# Patient Record
Sex: Female | Born: 1985
Health system: Southern US, Community
[De-identification: ages and names within clinical notes are randomized; demographics above are authoritative.]

## PROBLEM LIST (undated history)

## (undated) DIAGNOSIS — G43909 Migraine, unspecified, not intractable, without status migrainosus: Secondary | ICD-10-CM

## (undated) DIAGNOSIS — M1990 Unspecified osteoarthritis, unspecified site: Secondary | ICD-10-CM

## (undated) DIAGNOSIS — M25569 Pain in unspecified knee: Secondary | ICD-10-CM

## (undated) HISTORY — PX: WISDOM TOOTH EXTRACTION: SHX21

## (undated) HISTORY — DX: Pain in unspecified knee: M25.569

## (undated) HISTORY — DX: Migraine, unspecified, not intractable, without status migrainosus: G43.909

## (undated) HISTORY — DX: Unspecified osteoarthritis, unspecified site: M19.90

---
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
## 2007-03-10 ENCOUNTER — Emergency Department (HOSPITAL_COMMUNITY): Admission: EM | Admit: 2007-03-10 | Discharge: 2007-03-11 | Payer: Self-pay | Admitting: Emergency Medicine

## 2007-03-13 ENCOUNTER — Emergency Department (HOSPITAL_COMMUNITY): Admission: EM | Admit: 2007-03-13 | Discharge: 2007-03-13 | Payer: Self-pay | Admitting: Emergency Medicine

## 2007-07-10 ENCOUNTER — Ambulatory Visit: Payer: Self-pay | Admitting: Obstetrics and Gynecology

## 2007-08-19 ENCOUNTER — Emergency Department (HOSPITAL_COMMUNITY): Admission: EM | Admit: 2007-08-19 | Discharge: 2007-08-19 | Payer: Self-pay | Admitting: Emergency Medicine

## 2008-07-04 ENCOUNTER — Emergency Department (HOSPITAL_COMMUNITY): Admission: EM | Admit: 2008-07-04 | Discharge: 2008-07-04 | Payer: Self-pay | Admitting: Emergency Medicine

## 2009-12-01 ENCOUNTER — Emergency Department (HOSPITAL_COMMUNITY): Admission: EM | Admit: 2009-12-01 | Discharge: 2009-12-01 | Payer: Self-pay | Admitting: Emergency Medicine

## 2011-03-10 NOTE — Consult Note (Signed)
NAME:  SAVON, COBBS NO.:  [id number]   MEDICAL RECORD NO.:  [id number]          PATIENT TYPE:  OIB   LOCATION:  A415                          FACILITY:  [facility]   PHYSICIAN:  Tilda Burrow, M.D. DATE OF BIRTH:  19-Sep-1986   DATE OF CONSULTATION:  11/10/2006  DATE OF DISCHARGE:                                 CONSULTATION   CHIEF COMPLAINT:  Lower abdominal discomfort, right lower abdomen.   HISTORY OF PRESENT ILLNESS:  This 25 year old primiparous female, [facility]  January 09, 2007, 31.4 weeks' gestation by verbal history, a patient at  [facility] Department scheduled for delivery at [facility],  presents to [facility] complaining of abdominal discomfort.  There has been no bleeding or gush of fluid.  The discomfort is vague  right-sided lower abdomen discomfort.  It has been uncomfortable to the  patient for about a day.  There is no precipitating event and the  patient presents to rule out labor.   External monitoring shows minimal uterine irritability.  Fetal heart  rate is in a reactive pattern.  Cervical exam is performed.  Cervix is  posterior, long, closed and high.  Presenting part is ballottable.  Ultrasound is not performed.   Laboratory assessment includes urinalysis, which is negative for  evidence of urinary tract infection.  Urine drug screen negative.   IMPRESSION:  Nonspecific discomforts of pregnancy.  I suspected round  ligament pain and no evidence of premature labor.   PLAN:  I reassured the patient.  Two Vicodin were given the patient.  Will discharge home for follow-up in [facility].      Tilda Burrow, M.D.  Electronically Signed     JVF/MEDQ  D:  11/10/2006  T:  11/10/2006  Job:  [id number]

## 2011-08-02 LAB — POCT PREGNANCY, URINE
Operator id: 198171
Preg Test, Ur: NEGATIVE

## 2011-08-03 LAB — POCT PREGNANCY, URINE
Operator id: 134861
Preg Test, Ur: NEGATIVE

## 2011-12-13 ENCOUNTER — Emergency Department (HOSPITAL_COMMUNITY)
Admission: EM | Admit: 2011-12-13 | Discharge: 2011-12-14 | Disposition: A | Discharge status: Home / Self Care | Payer: Self-pay | Attending: Emergency Medicine | Admitting: Emergency Medicine

## 2011-12-13 DIAGNOSIS — R10819 Abdominal tenderness, unspecified site: Secondary | ICD-10-CM | POA: Insufficient documentation

## 2011-12-13 DIAGNOSIS — N72 Inflammatory disease of cervix uteri: Secondary | ICD-10-CM | POA: Insufficient documentation

## 2011-12-13 DIAGNOSIS — R1031 Right lower quadrant pain: Secondary | ICD-10-CM | POA: Insufficient documentation

## 2011-12-13 DIAGNOSIS — N390 Urinary tract infection, site not specified: Secondary | ICD-10-CM | POA: Insufficient documentation

## 2011-12-14 ENCOUNTER — Encounter (HOSPITAL_COMMUNITY): Payer: Self-pay | Admitting: Emergency Medicine

## 2011-12-14 ENCOUNTER — Emergency Department (HOSPITAL_COMMUNITY): Payer: Self-pay

## 2011-12-14 LAB — COMPREHENSIVE METABOLIC PANEL
AST: 13 U/L (ref 0–37)
Alkaline Phosphatase: 46 U/L (ref 39–117)
CO2: 26 mEq/L (ref 19–32)
Calcium: 9.4 mg/dL (ref 8.4–10.5)
GFR calc non Af Amer: >90 mL/min (ref >90)
Total Protein: 7 g/dL (ref 6.0–8.3)

## 2011-12-14 LAB — DIFFERENTIAL
Basophils Absolute: 0 10*3/uL (ref 0.0–0.1)
Eosinophils Absolute: 0.1 10*3/uL (ref 0.0–0.7)
Eosinophils Relative: 1 % (ref 0–5)
Lymphocytes Relative: 31 % (ref 12–46)

## 2011-12-14 LAB — CBC
Hemoglobin: 13.2 g/dL (ref 12.0–15.0)
MCH: 30.1 pg (ref 26.0–34.0)
RBC: 4.38 MIL/uL (ref 3.87–5.11)
RDW: 12.5 % (ref 11.5–15.5)
WBC: 8.3 10*3/uL (ref 4.0–10.5)

## 2011-12-14 LAB — URINALYSIS, ROUTINE W REFLEX MICROSCOPIC
Bilirubin Urine: NEGATIVE
Glucose, UA: NEGATIVE mg/dL
Ketones, ur: NEGATIVE mg/dL
Specific Gravity, Urine: 1.032 — ABNORMAL HIGH (ref 1.005–1.030)
pH: 7 (ref 5.0–8.0)

## 2011-12-14 LAB — WET PREP, GENITAL: Yeast Wet Prep HPF POC: NONE SEEN

## 2011-12-14 MED ORDER — HYDROCODONE-ACETAMINOPHEN 5-500 MG PO TABS: 1.0000 | ORAL_TABLET | Freq: Four times a day (QID) | ORAL | Status: AC | PRN

## 2011-12-14 MED ORDER — DEXTROSE 5 % IV SOLN
1.0000 g | Freq: Once | INTRAVENOUS | Status: AC
  Administered 2011-12-14: 1 g via INTRAVENOUS
  Filled 2011-12-14: qty 10

## 2011-12-14 MED ORDER — SODIUM CHLORIDE 0.9 % IV BOLUS (SEPSIS)
1000.0000 mL | Freq: Once | INTRAVENOUS | Status: AC
  Administered 2011-12-14: 1000 mL via INTRAVENOUS

## 2011-12-14 MED ORDER — AZITHROMYCIN 250 MG PO TABS
1000.0000 mg | ORAL_TABLET | Freq: Once | ORAL | Status: AC
  Administered 2011-12-14: 1000 mg via ORAL
  Filled 2011-12-14: qty 4

## 2011-12-14 MED ORDER — NITROFURANTOIN MONOHYD MACRO 100 MG PO CAPS: 100.0000 mg | ORAL_CAPSULE | Freq: Two times a day (BID) | ORAL | Status: AC

## 2011-12-14 MED ORDER — ONDANSETRON HCL 4 MG/2ML IJ SOLN
4.0000 mg | Freq: Once | INTRAMUSCULAR | Status: AC
  Administered 2011-12-14: 4 mg via INTRAVENOUS
  Filled 2011-12-14: qty 2

## 2011-12-14 MED ORDER — IOHEXOL 300 MG/ML  SOLN
100.0000 mL | Freq: Once | INTRAMUSCULAR | Status: AC | PRN
  Administered 2011-12-14: 100 mL via INTRAVENOUS

## 2011-12-14 MED ORDER — MORPHINE SULFATE 4 MG/ML IJ SOLN
4.0000 mg | Freq: Once | INTRAMUSCULAR | Status: AC
  Administered 2011-12-14: 4 mg via INTRAVENOUS
  Filled 2011-12-14: qty 1

## 2011-12-14 NOTE — Discharge Instructions (Signed)

## 2011-12-14 NOTE — ED Provider Notes (Signed)
History     CSN: [id number]  Arrival date & time 12/13/11  2252   First MD Initiated Contact with Patient 12/14/11 0109      Chief Complaint  Patient presents with  . Abdominal Cramping    Rt abd    (Consider location/radiation/quality/duration/timing/severity/associated sxs/prior treatment) Patient is a 26 y.o. female presenting with abdominal pain. The history is provided by the patient. No language interpreter was used.  Abdominal Pain The primary symptoms of the illness include abdominal pain and vaginal discharge. The primary symptoms of the illness do not include fever, fatigue, shortness of breath, nausea, vomiting, diarrhea, dysuria or vaginal bleeding. The current episode started 6 to 12 hours ago. The onset of the illness was gradual. The problem has been gradually worsening.  The abdominal pain has been gradually worsening since its onset. The abdominal pain is located in the RLQ and suprapubic region. The abdominal pain does not radiate. The abdominal pain is relieved by nothing. The abdominal pain is exacerbated by movement and certain positions.  The vaginal discharge was first noticed 2 days ago. Vaginal discharge is a new problem. The patient believes that the vaginal discharge is unchanged since it began. The amount of discharge is moderate. The color of the discharge is clear. The discharge is described as homogeneous. The vaginal discharge is not associated with itching, burning or dysuria.  The patient states that she believes she is currently not pregnant. The patient has not had a change in bowel habit. Symptoms associated with the illness do not include chills, anorexia, diaphoresis, constipation, urgency or frequency.    History reviewed. No pertinent past medical history.  History reviewed. No pertinent past surgical history.  No family history on file.  History  Substance Use Topics  . Smoking status: Never Smoker   . Smokeless tobacco: Not on file  .  Alcohol Use: No    OB History    Grav Para Term Preterm Abortions TAB SAB Ect Mult Living                  Review of Systems  Constitutional: Negative for fever, chills, diaphoresis, activity change, appetite change and fatigue.  HENT: Negative for congestion, sore throat, rhinorrhea, neck pain and neck stiffness.   Respiratory: Negative for cough and shortness of breath.   Cardiovascular: Negative for chest pain and palpitations.  Gastrointestinal: Positive for abdominal pain. Negative for nausea, vomiting, diarrhea, constipation and anorexia.  Genitourinary: Positive for vaginal discharge. Negative for dysuria, urgency, frequency, flank pain and vaginal bleeding.  Skin: Negative for itching.  Neurological: Negative for dizziness, weakness, light-headedness, numbness and headaches.  All other systems reviewed and are negative.    Allergies  Penicillins  Home Medications   Current Outpatient Rx  Name Route Sig Dispense Refill  . HYDROCODONE-ACETAMINOPHEN 5-500 MG PO TABS Oral Take 1-2 tablets by mouth every 6 (six) hours as needed for pain. 12 tablet 0  . NITROFURANTOIN MONOHYD MACRO 100 MG PO CAPS Oral Take 1 capsule (100 mg total) by mouth 2 (two) times daily. 10 capsule 0    BP 112/58  Pulse 66  Temp(Src) 98.9 F (37.2 C) (Oral)  Resp 18  Ht 5' (1.524 m)  Wt 156 lb 3.2 oz (70.852 kg)  BMI 30.51 kg/m2  SpO2 100%  LMP 11/29/2011  Physical Exam  Nursing note and vitals reviewed. Constitutional: She is oriented to person, place, and time. She appears well-developed and well-nourished. No distress.  HENT:  Head: Normocephalic and atraumatic.  Mouth/Throat: Oropharynx is clear and moist.  Eyes: Conjunctivae and EOM are normal. Pupils are equal, round, and reactive to light.  Neck: Normal range of motion. Neck supple.  Cardiovascular: Normal rate, regular rhythm, normal heart sounds and intact distal pulses.  Exam reveals no gallop and no friction rub.   No murmur  heard. Pulmonary/Chest: Effort normal and breath sounds normal. No respiratory distress.  Abdominal: Soft. Bowel sounds are normal. There is tenderness (suprapubic and RLQ pain). There is no rebound and no guarding.  Genitourinary: Uterus normal. Vaginal discharge (purulence) found.       No adnexal pain on palpation  Musculoskeletal: Normal range of motion. She exhibits no tenderness.  Neurological: She is alert and oriented to person, place, and time. No cranial nerve deficit.  Skin: Skin is warm and dry. No rash noted.    ED Course  Procedures (including critical care time)  Labs Reviewed  URINALYSIS, ROUTINE W REFLEX MICROSCOPIC - Abnormal; Notable for the following:    Color, Urine AMBER (*) BIOCHEMICALS MAY BE AFFECTED BY COLOR   APPearance CLOUDY (*)    Specific Gravity, Urine 1.032 (*)    Leukocytes, UA SMALL (*)    All other components within normal limits  WET PREP, GENITAL - Abnormal; Notable for the following:    Clue Cells Wet Prep HPF POC FEW (*)    WBC, Wet Prep HPF POC FEW (*)    All other components within normal limits  URINE MICROSCOPIC-ADD ON - Abnormal; Notable for the following:    Squamous Epithelial / LPF FEW (*)    Bacteria, UA MANY (*)    All other components within normal limits  CBC  DIFFERENTIAL  COMPREHENSIVE METABOLIC PANEL  LIPASE, BLOOD  PREGNANCY, URINE  GC/CHLAMYDIA PROBE AMP, GENITAL   Ct Abdomen Pelvis W Contrast  12/14/2011  *RADIOLOGY REPORT*  Clinical Data: Right lower quadrant abdominal pain and cramping.  CT ABDOMEN AND PELVIS WITH CONTRAST  Technique:  Multidetector CT imaging of the abdomen and pelvis was performed following the standard protocol during bolus administration of intravenous contrast.  Contrast: OMNIPAQUE IOHEXOL 300 MG/ML IV SOLN  Comparison: None.  Findings: The visualized lung bases are clear.  There is minimal prominence of the intrahepatic biliary ducts.  The liver is otherwise unremarkable in appearance.  Given  the lack of distension of the common hepatic duct, this most likely reflects the patient's baseline.  The gallbladder is unremarkable in appearance.  The spleen is normal in appearance.  The pancreas and adrenal glands are grossly unremarkable.  The kidneys are unremarkable in appearance.  There is no evidence of hydronephrosis.  No renal or ureteral stones are seen.  No perinephric stranding is appreciated.  No free fluid is identified.  The small bowel is unremarkable in appearance.  The stomach is filled with contrast and is within normal limits.  No acute vascular abnormalities are seen.  The appendix is normal in caliber and contains air, without evidence for appendicitis.  The colon is partially filled with stool and is unremarkable in appearance.  A metallic piercing is noted at the umbilicus.  Postoperative change is noted along the anterior pelvic wall.  The bladder is mildly distended and grossly unremarkable in appearance.  The uterus is within normal limits; an intrauterine device is noted in expected position, at the fundus of the uterus. The ovaries appear relatively symmetric; no suspicious adnexal masses are seen.  No inguinal lymphadenopathy is seen.  No acute osseous abnormalities are identified.  IMPRESSION:  1.  No acute abnormalities seen within the abdomen or pelvis. 2.  Minimal prominence of the intrahepatic biliary ducts likely reflects the patient's baseline; the common hepatic duct remains normal in caliber, and the gallbladder is grossly unremarkable in appearance.  Original Report Authenticated By: Tonia Ghent, M.D.     1. UTI (lower urinary tract infection)   2. Cervicitis       MDM  Evidence of urinary tract infection and cervicitis on exam. There is no exam findings to suggest PID. CT abdomen pelvis was performed and showed right lower quadrant tenderness. This is unremarkable. She'll be discharged home after receiving a dose of IV Rocephin in the emergency department.  She'll be discharged home on Macrobid she has a penicillin allergy. She received Zithromax as well the emergency department to cover cervicitis. She'll be discharged home with some pain medication. Her to followup with her primary care physician        Dayton Bailiff, MD 12/14/11 [phone]

## 2011-12-14 NOTE — ED Notes (Signed)
Pt alert, nad, c/o right ovarian pain and cramping, describes as sharp, non radiating, denies bleeding, + discharge, denies bowel or bladder

## 2011-12-15 LAB — GC/CHLAMYDIA PROBE AMP, GENITAL: Chlamydia, DNA Probe: NEGATIVE

## 2012-08-26 ENCOUNTER — Other Ambulatory Visit (HOSPITAL_COMMUNITY)
Admission: RE | Admit: 2012-08-26 | Discharge: 2012-08-26 | Disposition: A | Discharge status: Home / Self Care | Payer: 59 | Source: Ambulatory Visit | Attending: Family Medicine | Admitting: Family Medicine

## 2012-08-26 ENCOUNTER — Other Ambulatory Visit: Payer: Self-pay | Admitting: Family Medicine

## 2012-08-26 DIAGNOSIS — Z124 Encounter for screening for malignant neoplasm of cervix: Secondary | ICD-10-CM | POA: Insufficient documentation

## 2012-08-26 DIAGNOSIS — Z113 Encounter for screening for infections with a predominantly sexual mode of transmission: Secondary | ICD-10-CM | POA: Insufficient documentation

## 2014-07-05 ENCOUNTER — Ambulatory Visit (INDEPENDENT_AMBULATORY_CARE_PROVIDER_SITE_OTHER): Payer: BC Managed Care – PPO | Admitting: Emergency Medicine

## 2014-07-05 VITALS — BP 108/58 | HR 75 | Temp 98.8°F | Resp 16 | Ht 62.0 in | Wt 160.0 lb

## 2014-07-05 DIAGNOSIS — B9689 Other specified bacterial agents as the cause of diseases classified elsewhere: Secondary | ICD-10-CM

## 2014-07-05 DIAGNOSIS — N898 Other specified noninflammatory disorders of vagina: Secondary | ICD-10-CM

## 2014-07-05 DIAGNOSIS — R300 Dysuria: Secondary | ICD-10-CM

## 2014-07-05 DIAGNOSIS — N760 Acute vaginitis: Secondary | ICD-10-CM

## 2014-07-05 DIAGNOSIS — A499 Bacterial infection, unspecified: Secondary | ICD-10-CM

## 2014-07-05 LAB — POCT URINALYSIS DIPSTICK
BILIRUBIN UA: NEGATIVE
Glucose, UA: NEGATIVE
KETONES UA: NEGATIVE
Nitrite, UA: POSITIVE
PH UA: 7
SPEC GRAV UA: 1.025
UROBILINOGEN UA: 0.2

## 2014-07-05 LAB — POCT WET PREP WITH KOH
Clue Cells Wet Prep HPF POC: 75
KOH PREP POC: NEGATIVE
RBC Wet Prep HPF POC: NEGATIVE
TRICHOMONAS UA: NEGATIVE
WBC WET PREP PER HPF POC: NEGATIVE
Yeast Wet Prep HPF POC: NEGATIVE

## 2014-07-05 LAB — POCT UA - MICROSCOPIC ONLY
CASTS, UR, LPF, POC: NEGATIVE
CRYSTALS, UR, HPF, POC: NEGATIVE
Mucus, UA: NEGATIVE
YEAST UA: NEGATIVE

## 2014-07-05 LAB — POCT URINE PREGNANCY: PREG TEST UR: NEGATIVE

## 2014-07-05 MED ORDER — PHENAZOPYRIDINE HCL 200 MG PO TABS: 200.0000 mg | ORAL_TABLET | Freq: Three times a day (TID) | ORAL | Status: DC | PRN

## 2014-07-05 MED ORDER — METRONIDAZOLE 500 MG PO TABS: 500.0000 mg | ORAL_TABLET | Freq: Two times a day (BID) | ORAL | Status: DC

## 2014-07-05 MED ORDER — CIPROFLOXACIN HCL 500 MG PO TABS: 500.0000 mg | ORAL_TABLET | Freq: Two times a day (BID) | ORAL | Status: DC

## 2014-07-05 NOTE — Progress Notes (Signed)
[facility] [address] [place] [address] [phone]  Date:  07/05/2014   Name:  Judy James   DOB:  01-13-1986   MRN:  [id number]  PCP:  No primary provider on file.    Chief Complaint: Dysuria   History of Present Illness:  Judy James is a 28 y.o. very pleasant female patient who presents with the following:  Patient has an IUD in place.  Last menses normal. Has dysuria, urgency and frequent urination. Has foul smell with urine and some bloody mucous. No dyspareunia No improvement with over the counter medications or other home remedies.  Denies other complaint or health concern today.   There are no active problems to display for this patient.   No past medical history on file.  Past Surgical History  Procedure Laterality Date  . Cesarean section      History  Substance Use Topics  . Smoking status: Never Smoker   . Smokeless tobacco: Not on file  . Alcohol Use: No     Comment: 0-1    Family History  Problem Relation Age of Onset  . Diabetes Mother     Allergies  Allergen Reactions  . Penicillins     Medication list has been reviewed and updated.  No current outpatient prescriptions on file prior to visit.   No current facility-administered medications on file prior to visit.    Review of Systems:  As per HPI, otherwise negative.    Physical Examination: Filed Vitals:   07/05/14 0853  BP: 108/58  Pulse: 75  Temp: 98.8 F (37.1 C)  Resp: 16   Filed Vitals:   07/05/14 0853  Height: 5\' 2"  (1.575 m)  Weight: 160 lb (72.576 kg)   Body mass index is 29.26 kg/(m^2). Ideal Body Weight: Weight in (lb) to have BMI = 25: 136.4   GEN: WDWN, NAD, Non-toxic, Alert & Oriented x 3 HEENT: Atraumatic, Normocephalic.  Ears and Nose: No external deformity. EXTR: No clubbing/cyanosis/edema NEURO: Normal gait.  PSYCH: Normally interactive. Conversant. Not depressed or anxious appearing.  Calm demeanor.  ABD: soft  and not tender.   Assessment and Plan: Cystitis BV cipro  Pyridium Flagyl  Signed,  Ellison Carwin, MD   Results for orders placed in visit on 07/05/14  POCT UA - MICROSCOPIC ONLY      Result Value Ref Range   WBC, Ur, HPF, POC 25-30     RBC, urine, microscopic 1-3     Bacteria, U Microscopic 1+     Mucus, UA neg     Epithelial cells, urine per micros 0-1     Crystals, Ur, HPF, POC neg     Casts, Ur, LPF, POC neg     Yeast, UA neg    POCT URINALYSIS DIPSTICK      Result Value Ref Range   Color, UA yellow     Clarity, UA cloudy     Glucose, UA neg     Bilirubin, UA neg     Ketones, UA neg     Spec Grav, UA 1.025     Blood, UA moderate     pH, UA 7.0     Protein, UA >=300     Urobilinogen, UA 0.2     Nitrite, UA positive     Leukocytes, UA large (3+)    POCT URINE PREGNANCY      Result Value Ref Range   Preg Test, Ur Negative    POCT  WET PREP WITH KOH      Result Value Ref Range   Trichomonas, UA Negative     Clue Cells Wet Prep HPF POC 75%     Epithelial Wet Prep HPF POC 5-10     Yeast Wet Prep HPF POC neg     Bacteria Wet Prep HPF POC trace     RBC Wet Prep HPF POC neg     WBC Wet Prep HPF POC neg     KOH Prep POC Negative

## 2014-07-05 NOTE — Patient Instructions (Signed)
Bacterial Vaginosis Bacterial vaginosis is a vaginal infection that occurs when the normal balance of bacteria in the vagina is disrupted. It results from an overgrowth of certain bacteria. This is the most common vaginal infection in women of childbearing age. Treatment is important to prevent complications, especially in pregnant women, as it can cause a premature delivery. CAUSES  Bacterial vaginosis is caused by an increase in harmful bacteria that are normally present in smaller amounts in the vagina. Several different kinds of bacteria can cause bacterial vaginosis. However, the reason that the condition develops is not fully understood. RISK FACTORS Certain activities or behaviors can put you at an increased risk of developing bacterial vaginosis, including:  Having a new sex partner or multiple sex partners.  Douching.  Using an intrauterine device (IUD) for contraception. Women do not get bacterial vaginosis from toilet seats, bedding, swimming pools, or contact with objects around them. SIGNS AND SYMPTOMS  Some women with bacterial vaginosis have no signs or symptoms. Common symptoms include:  Grey vaginal discharge.  A fishlike odor with discharge, especially after sexual intercourse.  Itching or burning of the vagina and vulva.  Burning or pain with urination. DIAGNOSIS  Your health care provider will take a medical history and examine the vagina for signs of bacterial vaginosis. A sample of vaginal fluid may be taken. Your health care provider will look at this sample under a microscope to check for bacteria and abnormal cells. A vaginal pH test may also be done.  TREATMENT  Bacterial vaginosis may be treated with antibiotic medicines. These may be given in the form of a pill or a vaginal cream. A second round of antibiotics may be prescribed if the condition comes back after treatment.  HOME CARE INSTRUCTIONS   Only take over-the-counter or prescription medicines as  directed by your health care provider.  If antibiotic medicine was prescribed, take it as directed. Make sure you finish it even if you start to feel better.  Do not have sex until treatment is completed.  Tell all sexual partners that you have a vaginal infection. They should see their health care provider and be treated if they have problems, such as a mild rash or itching.  Practice safe sex by using condoms and only having one sex partner. SEEK MEDICAL CARE IF:   Your symptoms are not improving after 3 days of treatment.  You have increased discharge or pain.  You have a fever. MAKE SURE YOU:   Understand these instructions.  Will watch your condition.  Will get help right away if you are not doing well or get worse. FOR MORE INFORMATION  Centers for Disease Control and Prevention, Division of STD Prevention: www.cdc.gov/std American Sexual Health Association (ASHA): www.ashastd.org  Document Released: 10/09/2005 Document Revised: 07/30/2013 Document Reviewed: 05/21/2013 ExitCare Patient Information 2015 [employer]. This information is not intended to replace advice given to you by your health care provider. Make sure you discuss any questions you have with your health care provider.  

## 2014-07-07 LAB — GC/CHLAMYDIA PROBE AMP
CT PROBE, AMP APTIMA: NEGATIVE
GC PROBE AMP APTIMA: NEGATIVE

## 2014-08-11 ENCOUNTER — Ambulatory Visit (INDEPENDENT_AMBULATORY_CARE_PROVIDER_SITE_OTHER): Payer: BC Managed Care – PPO | Admitting: Family Medicine

## 2014-08-11 ENCOUNTER — Ambulatory Visit (INDEPENDENT_AMBULATORY_CARE_PROVIDER_SITE_OTHER)
Admission: RE | Admit: 2014-08-11 | Discharge: 2014-08-11 | Disposition: A | Discharge status: Home / Self Care | Payer: BC Managed Care – PPO | Source: Ambulatory Visit | Attending: Family Medicine | Admitting: Family Medicine

## 2014-08-11 ENCOUNTER — Encounter: Payer: Self-pay | Admitting: Family Medicine

## 2014-08-11 VITALS — BP 112/68 | HR 75 | Temp 98.3°F | Ht 61.25 in | Wt 163.5 lb

## 2014-08-11 DIAGNOSIS — G43909 Migraine, unspecified, not intractable, without status migrainosus: Secondary | ICD-10-CM | POA: Insufficient documentation

## 2014-08-11 DIAGNOSIS — G43919 Migraine, unspecified, intractable, without status migrainosus: Secondary | ICD-10-CM

## 2014-08-11 DIAGNOSIS — M25562 Pain in left knee: Secondary | ICD-10-CM

## 2014-08-11 MED ORDER — SUMATRIPTAN SUCCINATE 25 MG PO TABS: 25.0000 mg | ORAL_TABLET | ORAL | Status: DC | PRN

## 2014-08-11 NOTE — Assessment & Plan Note (Signed)
Progressive. Xray today for initial work up. The patient indicates understanding of these issues and agrees with the plan.

## 2014-08-11 NOTE — Progress Notes (Signed)
Pre visit review using our clinic review tool, if applicable. No additional management support is needed unless otherwise documented below in the visit note. 

## 2014-08-11 NOTE — Patient Instructions (Addendum)
Great to meet you.  We are starting Imitrex 25 mg as needed for migraines. Please keep a headache journal.  Call in a month or so with an update.  Perhaps you should try taking a zyrtec every morning.  I will call you with your xray results.

## 2014-08-11 NOTE — Assessment & Plan Note (Signed)
Progressing.  She would likely benefit from prophylaxis. Advised to cut back on excedrin migraine- can cause rebound headaches and can damage her kidneys.  Given rx for imitrex for abortive therapy- discussed not overusing this. Keep a headache journal. Follow up in 3-4 weeks- will plan on adding another prophylactic rx like elavil if needed at that time. The patient indicates understanding of these issues and agrees with the plan.

## 2014-08-11 NOTE — Progress Notes (Signed)
Subjective:    Patient ID: Judy James, female    DOB: 1986/07/25, 28 y.o.   MRN: [id number]  HPI  Very pleasant 28 yo female here to establish care.  Migraine headaches- developed shortly after the birth of her daughter in 2008.  Getting progressively worse.  Had an MRI in 2010 per pt that was neg.  Was placed on topamax for prophylaxis--did not take it for long because it made her too groggy.  Was never given abortive therapy per pt.  She takes excedrin migraine when she gets a headache.  Has more than 3 migraines per month- associated with phonophobia, photophobia, and intermittent nausea and vomiting. She is unsure of the triggers- kept a headache journal.  Did not notice any triggers like food, stress, fatigue. Periods are light- on OCPs.  Starting OCPs did not improve her migraines. She did notice they worsened when she started her current job.  Also notices she sneezes as soon as she gets to work.  Left knee pain- chronic and progressing issue. Diagnosed with Osgood-Schlatter in 2nd grade.  Since then, she has had knee pain and swelling especially when the weather changes.  Her knee has given out on her.  Has tried wearing a knee brace.  No imaging doe since she was a child.  No current outpatient prescriptions on file prior to visit.   No current facility-administered medications on file prior to visit.    Allergies  Allergen Reactions  . Penicillins     Past Medical History  Diagnosis Date  . Migraines   . Knee pain   . Arthritis     knee    Past Surgical History  Procedure Laterality Date  . Cesarean section    . Cesarean section  2008  . Wisdom tooth extraction      Family History  Problem Relation Age of Onset  . Diabetes Mother   . Diabetes Maternal Aunt   . Hearing loss Maternal Aunt   . Heart disease Maternal Aunt   . Hypertension Maternal Aunt   . Stroke Maternal Aunt   . Alcohol abuse Maternal Uncle   . Diabetes Maternal Uncle   . Drug  abuse Maternal Uncle   . Hyperlipidemia Maternal Uncle   . Hypertension Maternal Uncle   . Heart disease Maternal Uncle   . Cancer Maternal Grandmother   . COPD Maternal Grandfather     History   Social History  . Marital Status: Single    Spouse Name: N/A    Number of Children: N/A  . Years of Education: N/A   Occupational History  . Not on file.   Social History Main Topics  . Smoking status: Never Smoker   . Smokeless tobacco: Never Used  . Alcohol Use: Yes     Comment: 0-1  . Drug Use: No  . Sexual Activity: Yes   Other Topics Concern  . Not on file   Social History Narrative  . No narrative on file   The PMH, PSH, Social History, Family History, Medications, and allergies have been reviewed in [facility], and have been updated if relevant.  Review of Systems  Constitutional: Negative for fatigue.  HENT: Positive for sneezing. Negative for congestion, rhinorrhea, sore throat and trouble swallowing.   Eyes: Positive for photophobia. Negative for visual disturbance.  Respiratory: Negative.   Gastrointestinal: Positive for nausea and vomiting.  Musculoskeletal: Positive for joint swelling. Negative for gait problem.  Neurological: Positive for headaches. Negative for dizziness, tremors, seizures,  syncope, speech difficulty, weakness and numbness.  Psychiatric/Behavioral: Negative.   All other systems reviewed and are negative.      Objective:   Physical Exam  Constitutional: She is oriented to person, place, and time. She appears well-developed and well-nourished. No distress.  HENT:  Head: Normocephalic and atraumatic.  Eyes: Pupils are equal, round, and reactive to light.  Neck: Normal range of motion. Neck supple.  Musculoskeletal:       Left knee: She exhibits swelling, deformity and bony tenderness. She exhibits no effusion, normal alignment and no LCL laxity. No medial joint line, no lateral joint line, no MCL, no LCL and no patellar tendon tenderness noted.    Neurological: She is alert and oriented to person, place, and time. No cranial nerve deficit. Coordination normal.  Skin: Skin is warm and dry.  Psychiatric: She has a normal mood and affect. Her behavior is normal. Thought content normal.   BP 112/68  Pulse 75  Temp(Src) 98.3 F (36.8 C) (Oral)  Ht 5' 1.25" (1.556 m)  Wt 163 lb 8 oz (74.163 kg)  BMI 30.63 kg/m2  SpO2 98%  LMP 07/22/2014        Assessment & Plan:

## 2014-09-02 ENCOUNTER — Ambulatory Visit (INDEPENDENT_AMBULATORY_CARE_PROVIDER_SITE_OTHER): Payer: BC Managed Care – PPO | Admitting: Family Medicine

## 2014-09-02 ENCOUNTER — Encounter: Payer: Self-pay | Admitting: Family Medicine

## 2014-09-02 VITALS — BP 118/64 | HR 68 | Temp 99.6°F | Ht 61.25 in | Wt 161.8 lb

## 2014-09-02 DIAGNOSIS — M9242 Juvenile osteochondrosis of patella, left knee: Secondary | ICD-10-CM

## 2014-09-02 DIAGNOSIS — M9252 Juvenile osteochondrosis of tibia and fibula, left leg: Principal | ICD-10-CM

## 2014-09-02 DIAGNOSIS — M92522 Juvenile osteochondrosis of tibia tubercle, left leg: Secondary | ICD-10-CM

## 2014-09-02 DIAGNOSIS — M25562 Pain in left knee: Secondary | ICD-10-CM

## 2014-09-02 NOTE — Progress Notes (Signed)
Dr. Frederico Hamman T. Sacha Topor, MD, [facility] [place], [address] Phone: [phone] Fax: [phone]  09/02/2014  Patient: Judy James, MRN: [id number], DOB: 1986/01/18, 28 y.o.  Primary Physician:  Arnette Norris, MD  Chief Complaint: Knee Pain  Subjective:   Judy James is a 28 y.o. very pleasant female patient who presents with the following:  Consulting MD: Dr. Deborra Medina Reason: Left knee pain  Pleasant young lady who has had knee pain since the third grade, between 15 and 20 years on her left tibial tubercle. As a child she was told that she had Freight forwarder, and then her symptoms will improve as she has gotten older. She was very active throughout her childhood, and she was a Therapist, sports in, and she also did track. She has been limited since that time and was unable to complete sometimes in both track and cheerleading secondary to knee pain all in this one location. She has not had any particular injury, or trauma or internal derangement.  She does use multiple braces, and she has taken multiple types of a inflammatories. She has done various types of rehabilitation programs and strengthening programs that different physicians have given to her over this long time.  Consult Ortho Surgery, Elsie Stain  Past Medical History, Surgical History, Social History, Family History, Problem List, Medications, and Allergies have been reviewed and updated if relevant.  GEN: No fevers, chills. Nontoxic. Primarily MSK c/o today. MSK: Detailed in the HPI GI: tolerating PO intake without difficulty Neuro: No numbness, parasthesias, or tingling associated. Otherwise the pertinent positives of the ROS are noted above.   Objective:   BP 118/64 mmHg  Pulse 68  Temp(Src) 99.6 F (37.6 C) (Oral)  Ht 5' 1.25" (1.556 m)  Wt 161 lb 12 oz (73.369 kg)  BMI 30.30 kg/m2  SpO2 96%  LMP 07/22/2014   GEN: WDWN, NAD, Non-toxic,  Alert & Oriented x 3 HEENT: Atraumatic, Normocephalic.  Ears and Nose: No external deformity. EXTR: No clubbing/cyanosis/edema NEURO: Normal gait.  PSYCH: Normally interactive. Conversant. Not depressed or anxious appearing.  Calm demeanor.   Knee:  L Gait: Normal heel toe pattern ROM: 0-130 Effusion: neg Echymosis or edema: none Patellar tendon NT DIRECTLY TENDER AT THE TIBIAL TUBERCLE AT PROMINENT BUMP Painful PLICA: neg Patellar grind: negative Medial and lateral patellar facet loading: negative medial and lateral joint lines:NT Mcmurray's neg Flexion-pinch neg Varus and valgus stress: stable Lachman: neg Ant and Post drawer: neg Hip abduction, IR, ER: WNL Hip flexion str: 5/5 Hip abd: 5/5 Quad: 5/5 VMO atrophy:No Hamstring concentric and eccentric: 5/5   Radiology: Dg Knee Complete 4 Views Left  08/11/2014   CLINICAL DATA:  Progressive left knee pain  EXAM: LEFT KNEE - COMPLETE 4+ VIEW  COMPARISON:  None.  FINDINGS: There is no evidence of fracture, dislocation, or joint effusion. There is a well corticated ossific fragment adjacent to the tibial tuberosity likely reflecting sequela of prior Osgood-Schlatter disease. There is no evidence of arthropathy or other focal bone abnormality. Soft tissues are unremarkable.  IMPRESSION: No acute osseous injury of the left knee.   Electronically Signed   By: Kathreen Devoid   On: 08/11/2014 13:02    Assessment and Plan:   Osgood-Schlatter's disease of left knee  Left knee pain  Pain and tibial tubercle at old [facility] Schlatters at former ossification center. Concerning in a 28 year old with bone pain in this region. Failure of conservative management  for approaching 20 years.  I discussed this with the patient, and at this point I think that it is reasonable for her to discuss this case with the knee surgeon to consider definitive surgical management. I am going to discuss this case with some of my colleagues for  recommendations.  I appreciate the opportunity to evaluate this very friendly patient. If you have any question regarding her care or prognosis, do not hesitate to ask.   Signed,  Maud Deed. Syrina Wake, MD   Patient's Medications  New Prescriptions   No medications on file  Previous Medications   SUMATRIPTAN (IMITREX) 25 MG TABLET    Take 1 tablet (25 mg total) by mouth every 2 (two) hours as needed for migraine or headache. May repeat in 2 hours if headache persists or recurs.  Modified Medications   No medications on file  Discontinued Medications   No medications on file

## 2014-09-02 NOTE — Progress Notes (Signed)
Pre visit review using our clinic review tool, if applicable. No additional management support is needed unless otherwise documented below in the visit note. 

## 2014-09-08 ENCOUNTER — Other Ambulatory Visit: Payer: Self-pay | Admitting: Family Medicine

## 2014-09-08 DIAGNOSIS — M25562 Pain in left knee: Secondary | ICD-10-CM

## 2014-09-08 NOTE — Progress Notes (Signed)
Judy James notified as instructed by telephone.  She would like to move forward with MRI. Advised Judy James would be in contact with her once she gets the MRI scheduled.

## 2014-09-08 NOTE — Progress Notes (Signed)
Would you mind calling the patient. I talked about her with one of my colleagues over the weekend, and we thought we should also get an MRI of her knee first to get a better look at the bone where she hurts and at all the other structures of her knee, too. We'll look at that and then go from there.   Electronically Signed  By: Owens Loffler, MD On: 09/08/2014 10:45 AM

## 2014-09-13 ENCOUNTER — Inpatient Hospital Stay: Admission: RE | Admit: 2014-09-13 | Payer: BC Managed Care – PPO | Source: Ambulatory Visit

## 2014-09-15 ENCOUNTER — Ambulatory Visit
Admission: RE | Admit: 2014-09-15 | Discharge: 2014-09-15 | Disposition: A | Discharge status: Home / Self Care | Payer: BC Managed Care – PPO | Source: Ambulatory Visit | Attending: Family Medicine | Admitting: Family Medicine

## 2014-09-15 DIAGNOSIS — M25562 Pain in left knee: Secondary | ICD-10-CM

## 2014-09-18 ENCOUNTER — Other Ambulatory Visit: Payer: Self-pay | Admitting: Family Medicine

## 2014-09-18 DIAGNOSIS — M7652 Patellar tendinitis, left knee: Secondary | ICD-10-CM

## 2014-09-18 DIAGNOSIS — M25562 Pain in left knee: Secondary | ICD-10-CM

## 2014-12-22 ENCOUNTER — Ambulatory Visit: Payer: 59 | Admitting: Family Medicine

## 2015-07-15 ENCOUNTER — Other Ambulatory Visit (HOSPITAL_COMMUNITY)
Admission: RE | Admit: 2015-07-15 | Discharge: 2015-07-15 | Disposition: A | Discharge status: Home / Self Care | Payer: 59 | Source: Ambulatory Visit | Attending: Family Medicine | Admitting: Family Medicine

## 2015-07-15 ENCOUNTER — Ambulatory Visit (INDEPENDENT_AMBULATORY_CARE_PROVIDER_SITE_OTHER): Payer: 59 | Admitting: Family Medicine

## 2015-07-15 ENCOUNTER — Encounter: Payer: Self-pay | Admitting: *Deleted

## 2015-07-15 ENCOUNTER — Encounter: Payer: Self-pay | Admitting: Family Medicine

## 2015-07-15 VITALS — BP 114/66 | HR 70 | Temp 97.9°F | Ht 61.0 in | Wt 168.5 lb

## 2015-07-15 DIAGNOSIS — Z1151 Encounter for screening for human papillomavirus (HPV): Secondary | ICD-10-CM | POA: Insufficient documentation

## 2015-07-15 DIAGNOSIS — N760 Acute vaginitis: Secondary | ICD-10-CM | POA: Insufficient documentation

## 2015-07-15 DIAGNOSIS — Z113 Encounter for screening for infections with a predominantly sexual mode of transmission: Secondary | ICD-10-CM | POA: Diagnosis present

## 2015-07-15 DIAGNOSIS — Z01419 Encounter for gynecological examination (general) (routine) without abnormal findings: Secondary | ICD-10-CM

## 2015-07-15 DIAGNOSIS — Z01411 Encounter for gynecological examination (general) (routine) with abnormal findings: Secondary | ICD-10-CM

## 2015-07-15 DIAGNOSIS — Z202 Contact with and (suspected) exposure to infections with a predominantly sexual mode of transmission: Secondary | ICD-10-CM | POA: Diagnosis not present

## 2015-07-15 MED ORDER — METRONIDAZOLE 500 MG PO TABS: 500.0000 mg | ORAL_TABLET | Freq: Two times a day (BID) | ORAL | Status: DC

## 2015-07-15 NOTE — Progress Notes (Signed)
Pre visit review using our clinic review tool, if applicable. No additional management support is needed unless otherwise documented below in the visit note. 

## 2015-07-15 NOTE — Patient Instructions (Signed)
Please return 2 weeks after finishing flagyl to retest. Great to see you.

## 2015-07-15 NOTE — Progress Notes (Signed)
Subjective:   Patient ID: Judy James, female    DOB: 01/11/1986, 29 y.o.   MRN: [id number]  Judy James is a pleasant 29 y.o. year old female who presents to clinic today with Annual Exam  on 07/15/2015  HPI:  Trichomonas- was diagnosed through the health department but not yet treated for trichomonas.  She does not have these lab results with her.   Her boyfriend is going to get tested today.  She has had more vaginal discharge recently.  No pelvic pain, no fevers.  No nausea or vomiting.  Per pt, was tested for HIV and syphilis and was told both negative.  Has IUD.  No current outpatient prescriptions on file prior to visit.   No current facility-administered medications on file prior to visit.    Allergies  Allergen Reactions  . Penicillins     Past Medical History  Diagnosis Date  . Migraines   . Knee pain   . Arthritis     knee    Past Surgical History  Procedure Laterality Date  . Cesarean section    . Cesarean section  2008  . Wisdom tooth extraction      Family History  Problem Relation Age of Onset  . Diabetes Mother   . Diabetes Maternal Aunt   . Hearing loss Maternal Aunt   . Heart disease Maternal Aunt   . Hypertension Maternal Aunt   . Stroke Maternal Aunt   . Alcohol abuse Maternal Uncle   . Diabetes Maternal Uncle   . Drug abuse Maternal Uncle   . Hyperlipidemia Maternal Uncle   . Hypertension Maternal Uncle   . Heart disease Maternal Uncle   . Cancer Maternal Grandmother   . COPD Maternal Grandfather     Social History   Social History  . Marital Status: Single    Spouse Name: N/A  . Number of Children: N/A  . Years of Education: N/A   Occupational History  . Not on file.   Social History Main Topics  . Smoking status: Never Smoker   . Smokeless tobacco: Never Used  . Alcohol Use: 0.0 oz/week    0 Standard drinks or equivalent per week     Comment: 0-1  . Drug Use: No  . Sexual Activity: Yes   Other Topics Concern   . Not on file   Social History Narrative   .[email or web address]    Review of Systems  Constitutional: Negative.   HENT: Negative.   Eyes: Negative.   Respiratory: Negative.   Cardiovascular: Negative.   Gastrointestinal: Negative.   Genitourinary: Positive for decreased urine volume and vaginal discharge. Negative for dysuria.  Musculoskeletal: Negative.   Skin: Negative.   Allergic/Immunologic: Negative.   Neurological: Negative.   Hematological: Negative.   Psychiatric/Behavioral: Negative.   All other systems reviewed and are negative.      Objective:    BP 114/66 mmHg  Pulse 70  Temp(Src) 97.9 F (36.6 C) (Oral)  Ht 5\' 1"  (1.549 m)  Wt 168 lb 8 oz (76.431 kg)  BMI 31.85 kg/m2  SpO2 98%   Physical Exam    General:  Well-developed,well-nourished,in no acute distress; alert,appropriate and cooperative throughout examination Head:  normocephalic and atraumatic.   Eyes:  vision grossly intact, pupils equal, pupils round, and pupils reactive to light.   Ears:  R ear normal and L ear normal.   Nose:  no external deformity.   Mouth:  good dentition.   Neck:  No deformities,  masses, or tenderness noted. Breasts:  No mass, nodules, thickening, tenderness, bulging, retraction, inflamation, nipple discharge or skin changes noted.   Lungs:  Normal respiratory effort, chest expands symmetrically. Lungs are clear to auscultation, no crackles or wheezes. Heart:  Normal rate and regular rhythm. S1 and S2 normal without gallop, murmur, click, rub or other extra sounds. Abdomen:  Bowel sounds positive,abdomen soft and non-tender without masses, organomegaly or hernias noted. Rectal:  no external abnormalities.   Genitalia:  Pelvic Exam:        External: normal female genitalia without lesions or masses        Vagina: normal without lesions or masses        Cervix: normal without lesions or masses IUD strings visible        Adnexa: normal bimanual exam without masses or fullness         Uterus: normal by palpation        Pap smear: performed Msk:  No deformity or scoliosis noted of thoracic or lumbar spine.   Extremities:  No clubbing, cyanosis, edema, or deformity noted with normal full range of motion of all joints.   Neurologic:  alert & oriented X3 and gait normal.   Skin:  Intact without suspicious lesions or rashes Cervical Nodes:  No lymphadenopathy noted Axillary Nodes:  No palpable lymphadenopathy Psych:  Cognition and judgment appear intact. Alert and cooperative with normal attention span and concentration. No apparent delusions, illusions, hallucinations      Assessment & Plan:   Well woman exam with routine gynecological exam  Trichomonas exposure No Follow-up on file.

## 2015-07-15 NOTE — Assessment & Plan Note (Signed)
Pap smear and breast exam done today.

## 2015-07-15 NOTE — Assessment & Plan Note (Signed)
Request records, will go ahead and treat with flagyl 500 mg twice daily x 7 days. Retest two weeks after completing treatment.

## 2015-07-15 NOTE — Addendum Note (Signed)
Addended by: Modena Nunnery on: 07/15/2015 12:15 PM   Modules accepted: Orders

## 2015-07-18 LAB — CERVICOVAGINAL ANCILLARY ONLY: Candida vaginitis: NEGATIVE

## 2015-07-20 LAB — CERVICOVAGINAL ANCILLARY ONLY: Herpes: NEGATIVE

## 2015-07-20 LAB — CYTOLOGY - PAP

## 2015-07-22 ENCOUNTER — Encounter: Payer: No Typology Code available for payment source | Admitting: Family Medicine

## 2015-08-05 ENCOUNTER — Ambulatory Visit (INDEPENDENT_AMBULATORY_CARE_PROVIDER_SITE_OTHER): Payer: 59 | Admitting: Family Medicine

## 2015-08-05 VITALS — BP 108/68 | HR 74 | Temp 98.4°F | Wt 171.2 lb

## 2015-08-05 DIAGNOSIS — A599 Trichomoniasis, unspecified: Secondary | ICD-10-CM | POA: Diagnosis not present

## 2015-08-05 NOTE — Patient Instructions (Signed)
Good to see you. We will call you with your results from today. 

## 2015-08-05 NOTE — Addendum Note (Signed)
Addended by: Ellamae Sia on: 08/05/2015 10:48 AM   Modules accepted: Orders

## 2015-08-05 NOTE — Addendum Note (Signed)
Addended by: Lucille Passy on: 08/05/2015 10:41 AM   Modules accepted: Orders

## 2015-08-05 NOTE — Progress Notes (Signed)
Pre visit review using our clinic review tool, if applicable. No additional management support is needed unless otherwise documented below in the visit note. 

## 2015-08-05 NOTE — Assessment & Plan Note (Signed)
S/p 1 week course of flagyl. Retest today. The patient indicates understanding of these issues and agrees with the plan. Orders Placed This Encounter  Procedures  . Trichomonas vaginalis, RNA

## 2015-08-05 NOTE — Progress Notes (Signed)
   Subjective:   Patient ID: Judy James, female    DOB: June 02, 1986, 29 y.o.   MRN: [id number]  Judy James is a pleasant 29 y.o. year old female who presents to clinic today with Follow-up  on 08/05/2015  HPI:  Diagnosed with trichomonas on 9/22- given rx for flagyl 500 mg twice daily x 7 days. Her partner was also treated at another office per pt.  Here for retest.  No current outpatient prescriptions on file prior to visit.   No current facility-administered medications on file prior to visit.    Allergies  Allergen Reactions  . Penicillins     Past Medical History  Diagnosis Date  . Migraines   . Knee pain   . Arthritis     knee    Past Surgical History  Procedure Laterality Date  . Cesarean section    . Cesarean section  2008  . Wisdom tooth extraction      Family History  Problem Relation Age of Onset  . Diabetes Mother   . Diabetes Maternal Aunt   . Hearing loss Maternal Aunt   . Heart disease Maternal Aunt   . Hypertension Maternal Aunt   . Stroke Maternal Aunt   . Alcohol abuse Maternal Uncle   . Diabetes Maternal Uncle   . Drug abuse Maternal Uncle   . Hyperlipidemia Maternal Uncle   . Hypertension Maternal Uncle   . Heart disease Maternal Uncle   . Cancer Maternal Grandmother   . COPD Maternal Grandfather     Social History   Social History  . Marital Status: Single    Spouse Name: N/A  . Number of Children: N/A  . Years of Education: N/A   Occupational History  . Not on file.   Social History Main Topics  . Smoking status: Never Smoker   . Smokeless tobacco: Never Used  . Alcohol Use: 0.0 oz/week    0 Standard drinks or equivalent per week     Comment: 0-1  . Drug Use: No  . Sexual Activity: Yes   Other Topics Concern  . Not on file   Social History Narrative   The PMH, PSH, Social History, Family History, Medications, and allergies have been reviewed in [facility], and have been updated if relevant.   Review of Systems    Constitutional: Negative.   Genitourinary: Negative.   Musculoskeletal: Negative.   All other systems reviewed and are negative.      Objective:    BP 108/68 mmHg  Pulse 74  Temp(Src) 98.4 F (36.9 C) (Oral)  Wt 171 lb 4 oz (77.678 kg)  SpO2 98%   Physical Exam  Constitutional: She is oriented to person, place, and time. She appears well-developed and well-nourished. No distress.  HENT:  Head: Normocephalic.  Eyes: Conjunctivae are normal.  Cardiovascular: Normal rate.   Pulmonary/Chest: Effort normal.  Abdominal: Soft.  Genitourinary: Vagina normal. No vaginal discharge found.  Musculoskeletal: Normal range of motion.  Neurological: She is alert and oriented to person, place, and time. No cranial nerve deficit.  Skin: Skin is warm and dry.  Psychiatric: She has a normal mood and affect. Her behavior is normal. Judgment and thought content normal.  Nursing note and vitals reviewed.         Assessment & Plan:   Trichomonas infection - Plan: Trichomonas vaginalis, RNA No Follow-up on file.

## 2015-08-06 ENCOUNTER — Encounter: Payer: Self-pay | Admitting: Family Medicine

## 2015-08-06 LAB — WET PREP BY MOLECULAR PROBE
Candida species: POSITIVE — AB
Gardnerella vaginalis: POSITIVE — AB
TRICHOMONAS VAG: NEGATIVE

## 2015-08-06 MED ORDER — METRONIDAZOLE 500 MG PO TABS: 500.0000 mg | ORAL_TABLET | Freq: Two times a day (BID) | ORAL | Status: DC

## 2015-08-06 MED ORDER — FLUCONAZOLE 150 MG PO TABS: 150.0000 mg | ORAL_TABLET | Freq: Once | ORAL | Status: DC

## 2015-08-06 NOTE — Addendum Note (Signed)
Addended by: Modena Nunnery on: 08/06/2015 09:28 AM   Modules accepted: Orders

## 2015-10-27 ENCOUNTER — Ambulatory Visit (INDEPENDENT_AMBULATORY_CARE_PROVIDER_SITE_OTHER): Payer: 59 | Admitting: Internal Medicine

## 2015-10-27 ENCOUNTER — Encounter: Payer: Self-pay | Admitting: Internal Medicine

## 2015-10-27 VITALS — BP 112/72 | HR 75 | Temp 98.3°F | Wt 166.0 lb

## 2015-10-27 DIAGNOSIS — J069 Acute upper respiratory infection, unspecified: Secondary | ICD-10-CM | POA: Diagnosis not present

## 2015-10-27 MED ORDER — HYDROCODONE-HOMATROPINE 5-1.5 MG/5ML PO SYRP: 5.0000 mL | ORAL_SOLUTION | Freq: Four times a day (QID) | ORAL | Status: DC | PRN

## 2015-10-27 MED ORDER — AZITHROMYCIN 250 MG PO TABS: ORAL_TABLET | ORAL | Status: DC

## 2015-10-27 NOTE — Progress Notes (Signed)
HPI  Pt presents to the clinic today with c/o nasal congestion, sore throat and cough. This started 1 week ago. She is not blowing anything out of her nose. The cough is productive of green/yellow mucous.  She denies shortness of breath. She denies fever but has had chills and body aches. 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 She has no history of allergies or breathing problems. She has not had sick contacts.  Review of Systems      Past Medical History  Diagnosis Date  . Migraines   . Knee pain   . Arthritis     knee    Family History  Problem Relation Age of Onset  . Diabetes Mother   . Diabetes Maternal Aunt   . Hearing loss Maternal Aunt   . Heart disease Maternal Aunt   . Hypertension Maternal Aunt   . Stroke Maternal Aunt   . Alcohol abuse Maternal Uncle   . Diabetes Maternal Uncle   . Drug abuse Maternal Uncle   . Hyperlipidemia Maternal Uncle   . Hypertension Maternal Uncle   . Heart disease Maternal Uncle   . Cancer Maternal Grandmother   . COPD Maternal Grandfather     Social History   Social History  . Marital Status: Single    Spouse Name: N/A  . Number of Children: N/A  . Years of Education: N/A   Occupational History  . Not on file.   Social History Main Topics  . Smoking status: Never Smoker   . Smokeless tobacco: Never Used  . Alcohol Use: 0.0 oz/week    0 Standard drinks or equivalent per week     Comment: 0-1  . Drug Use: No  . Sexual Activity: Yes   Other Topics Concern  . Not on file   Social History Narrative    Allergies  Allergen Reactions  . Penicillins      Constitutional: Positive headache, fatigue. Denies fever or abrupt weight changes.  HEENT:  Positive nasal congestion, sore throat. Denies eye redness, eye pain, pressure behind the eyes, facial pain, ear pain, ringing in the ears, wax buildup, runny nose or bloody nose. Respiratory: Positive cough. Denies difficulty breathing or shortness of breath.   Cardiovascular: Denies chest pain, chest tightness, palpitations or swelling in the hands or feet.   No other specific complaints in a complete review of systems (except as listed in HPI above).  Objective:   BP 112/72 mmHg  Pulse 75  Temp(Src) 98.3 F (36.8 C) (Oral)  Wt 166 lb (75.297 kg)  SpO2 99% Wt Readings from Last 3 Encounters:  10/27/15 166 lb (75.297 kg)  08/05/15 171 lb 4 oz (77.678 kg)  07/15/15 168 lb 8 oz (76.431 kg)     General: Appears her stated age, ill appearing in NAD. HEENT: Head: normal shape and size, no sinus tenderness noted; Eyes: sclera white, no icterus, conjunctiva pink; Ears: Tm's gray and intact, normal light reflex; Nose: mucosa pink and moist, septum midline; Throat/Mouth: Teeth present, mucosa erythematous and moist, no exudate noted, no lesions or ulcerations noted.  Neck: Cervical lymphadenopathy noted.  Cardiovascular: Normal rate and rhythm. S1,S2 noted.  No murmur, rubs or gallops noted.  Pulmonary/Chest: Normal effort and positive vesicular breath sounds. No respiratory distress. No wheezes, rales or ronchi noted.      Assessment & Plan:   Upper Respiratory Infection:  Get some rest and drink plenty of water Do salt water gargles for the sore throat eRx for Azithromax  x 5 days eRx for Hycodan cough syrup  RTC as needed or if symptoms persist.

## 2015-10-27 NOTE — Progress Notes (Signed)
Pre visit review using our clinic review tool, if applicable. No additional management support is needed unless otherwise documented below in the visit note. 

## 2015-10-28 ENCOUNTER — Ambulatory Visit: Payer: 59 | Admitting: Primary Care

## 2015-10-28 NOTE — Patient Instructions (Signed)
Upper Respiratory Infection, Adult Most upper respiratory infections (URIs) are a viral infection of the air passages leading to the lungs. A URI affects the nose, throat, and upper air passages. The most common type of URI is nasopharyngitis and is typically referred to as "the common cold." URIs run their course and usually go away on their own. Most of the time, a URI does not require medical attention, but sometimes a bacterial infection in the upper airways can follow a viral infection. This is called a secondary infection. Sinus and middle ear infections are common types of secondary upper respiratory infections. Bacterial pneumonia can also complicate a URI. A URI can worsen asthma and chronic obstructive pulmonary disease (COPD). Sometimes, these complications can require emergency medical care and may be life threatening.  CAUSES Almost all URIs are caused by viruses. A virus is a type of germ and can spread from one person to another.  RISKS FACTORS You may be at risk for a URI if:   You smoke.   You have chronic heart or lung disease.  You have a weakened defense (immune) system.   You are very young or very old.   You have nasal allergies or asthma.  You work in crowded or poorly ventilated areas.  You work in health care facilities or schools. SIGNS AND SYMPTOMS  Symptoms typically develop 2-3 days after you come in contact with a cold virus. Most viral URIs last 7-10 days. However, viral URIs from the influenza virus (flu virus) can last 14-18 days and are typically more severe. Symptoms may include:   Runny or stuffy (congested) nose.   Sneezing.   Cough.   Sore throat.   Headache.   Fatigue.   Fever.   Loss of appetite.   Pain in your forehead, behind your eyes, and over your cheekbones (sinus pain).  Muscle aches.  DIAGNOSIS  Your health care provider may diagnose a URI by:  Physical exam.  Tests to check that your symptoms are not due to  another condition such as:  Strep throat.  Sinusitis.  Pneumonia.  Asthma. TREATMENT  A URI goes away on its own with time. It cannot be cured with medicines, but medicines may be prescribed or recommended to relieve symptoms. Medicines may help:  Reduce your fever.  Reduce your cough.  Relieve nasal congestion. HOME CARE INSTRUCTIONS   Take medicines only as directed by your health care provider.   Gargle warm saltwater or take cough drops to comfort your throat as directed by your health care provider.  Use a warm mist humidifier or inhale steam from a shower to increase air moisture. This may make it easier to breathe.  Drink enough fluid to keep your urine clear or pale yellow.   Eat soups and other clear broths and maintain good nutrition.   Rest as needed.   Return to work when your temperature has returned to normal or as your health care provider advises. You may need to stay home longer to avoid infecting others. You can also use a face mask and careful hand washing to prevent spread of the virus.  Increase the usage of your inhaler if you have asthma.   Do not use any tobacco products, including cigarettes, chewing tobacco, or electronic cigarettes. If you need help quitting, ask your health care provider. PREVENTION  The best way to protect yourself from getting a cold is to practice good hygiene.   Avoid oral or hand contact with people with cold   symptoms.   Wash your hands often if contact occurs.  There is no clear evidence that vitamin C, vitamin E, echinacea, or exercise reduces the chance of developing a cold. However, it is always recommended to get plenty of rest, exercise, and practice good nutrition.  SEEK MEDICAL CARE IF:   You are getting worse rather than better.   Your symptoms are not controlled by medicine.   You have chills.  You have worsening shortness of breath.  You have brown or red mucus.  You have yellow or brown nasal  discharge.  You have pain in your face, especially when you bend forward.  You have a fever.  You have swollen neck glands.  You have pain while swallowing.  You have white areas in the back of your throat. SEEK IMMEDIATE MEDICAL CARE IF:   You have severe or persistent:  Headache.  Ear pain.  Sinus pain.  Chest pain.  You have chronic lung disease and any of the following:  Wheezing.  Prolonged cough.  Coughing up blood.  A change in your usual mucus.  You have a stiff neck.  You have changes in your:  Vision.  Hearing.  Thinking.  Mood. MAKE SURE YOU:   Understand these instructions.  Will watch your condition.  Will get help right away if you are not doing well or get worse.   This information is not intended to replace advice given to you by your health care provider. Make sure you discuss any questions you have with your health care provider.   Document Released: 04/04/2001 Document Revised: 02/23/2015 Document Reviewed: 01/14/2014 Elsevier Interactive Patient Education 2016 [employer]  

## 2016-06-29 ENCOUNTER — Ambulatory Visit: Payer: 59 | Admitting: Family Medicine

## 2016-07-05 IMAGING — CR DG KNEE COMPLETE 4+V*L*
5 series · 5 of 5 positions shown · non-contrast
Comparison: None.

CLINICAL DATA: Progressive left knee pain

EXAM:
LEFT KNEE - COMPLETE 4+ VIEW

[view not recorded (1 of 5)]
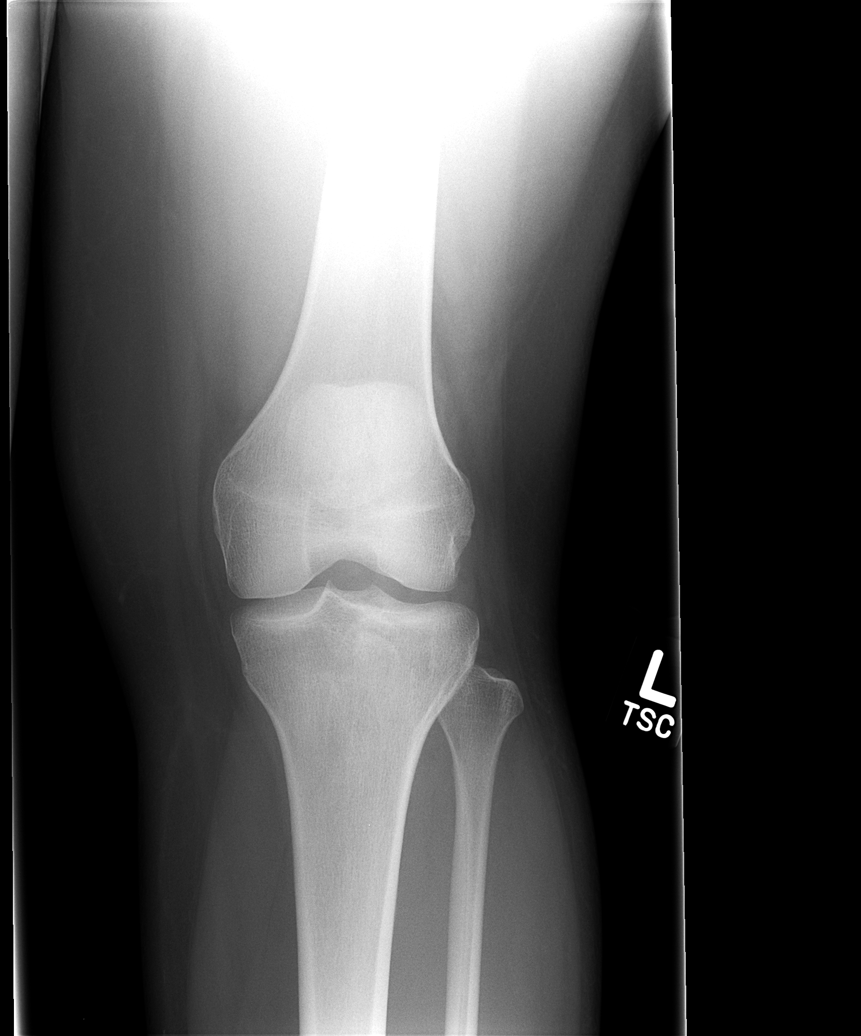

[view not recorded (2 of 5)]
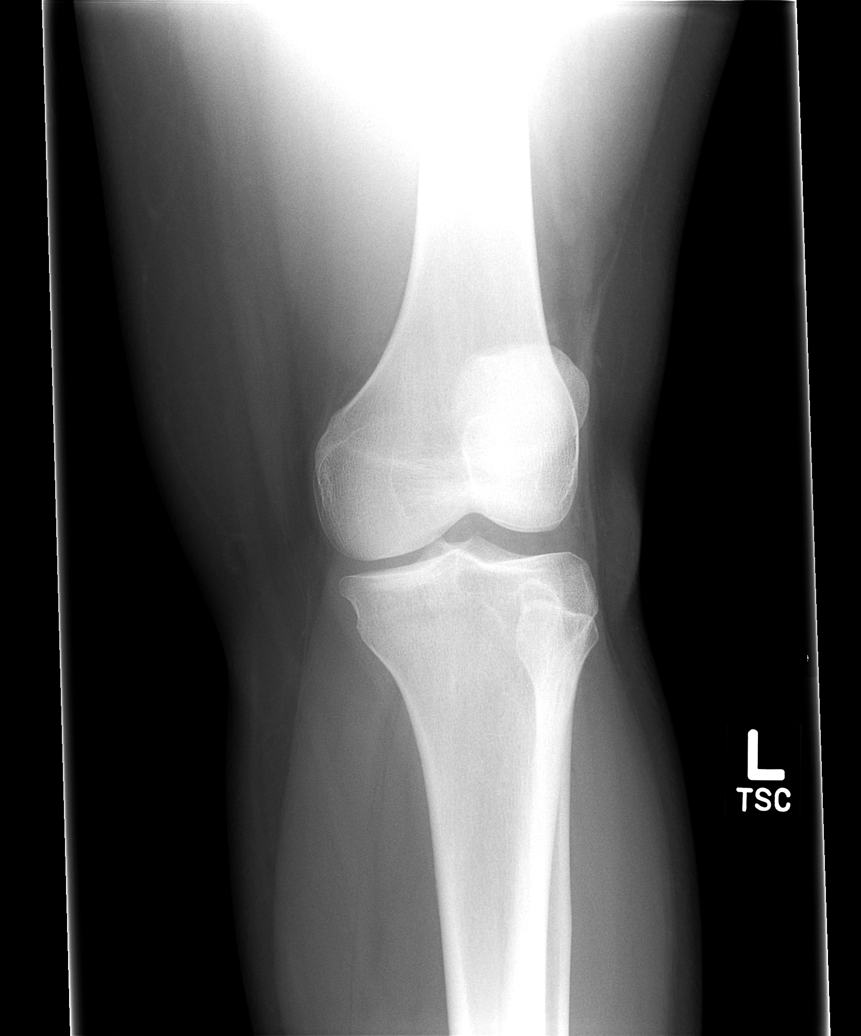

[view not recorded (3 of 5)]
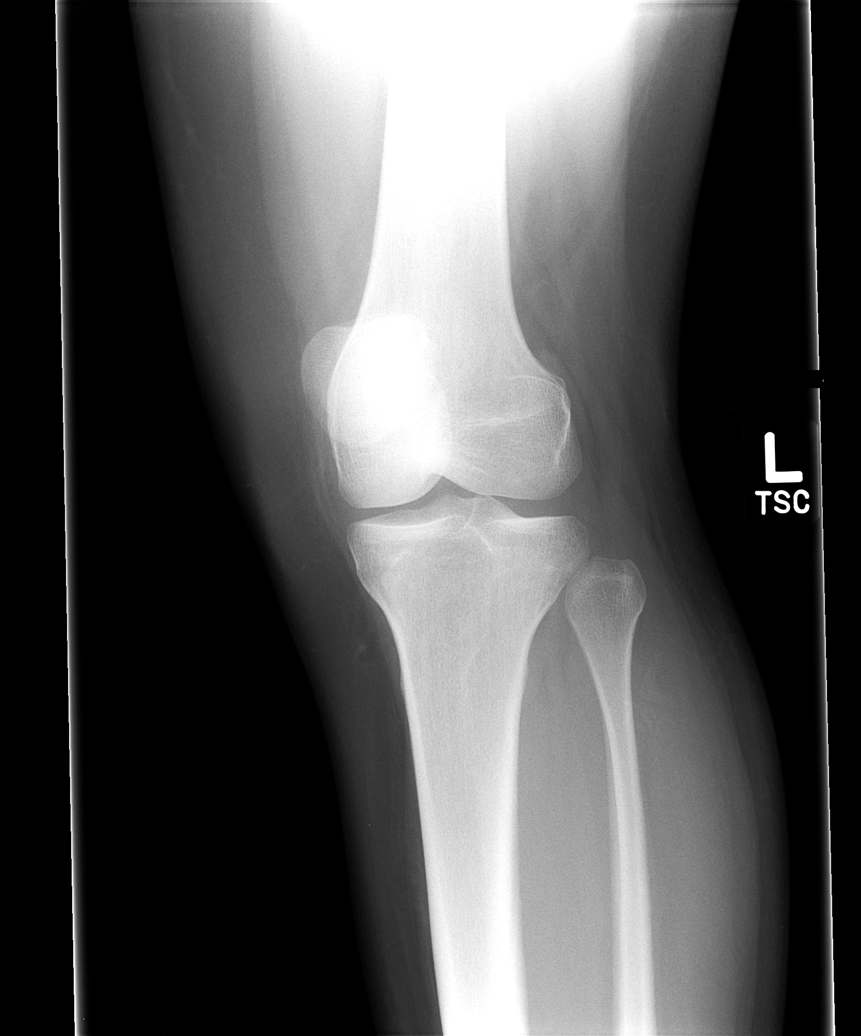

[view not recorded (4 of 5)]
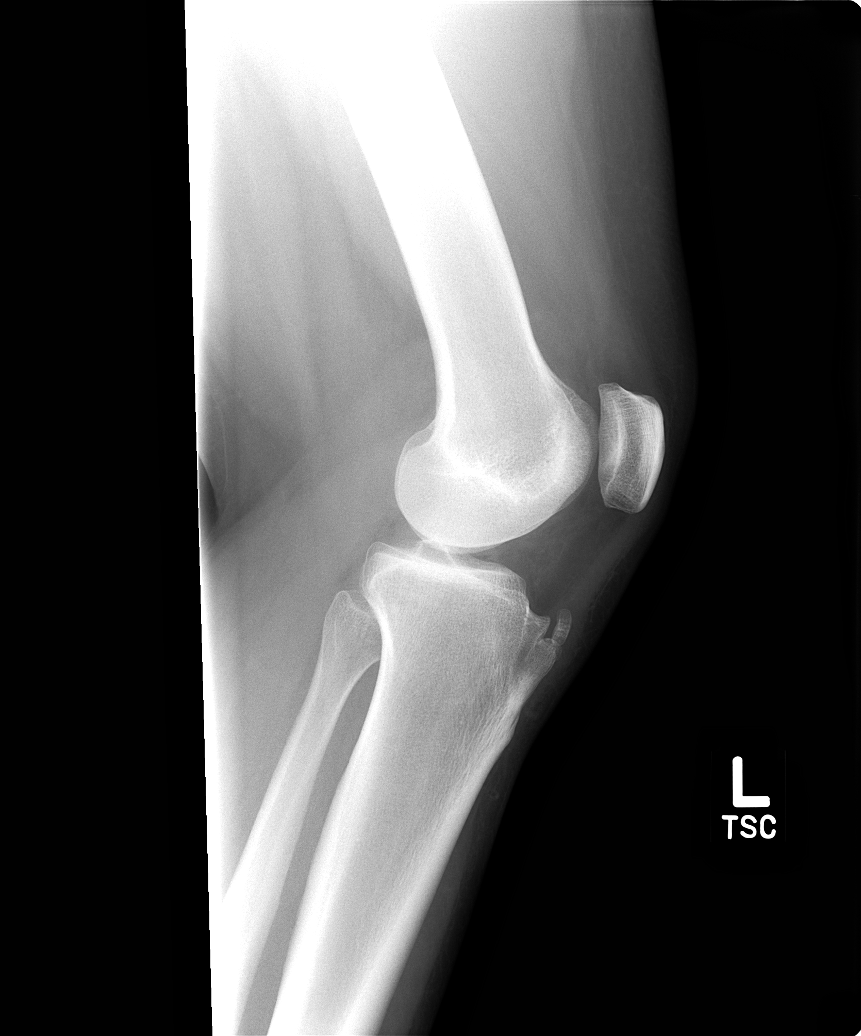

[view not recorded (5 of 5)]
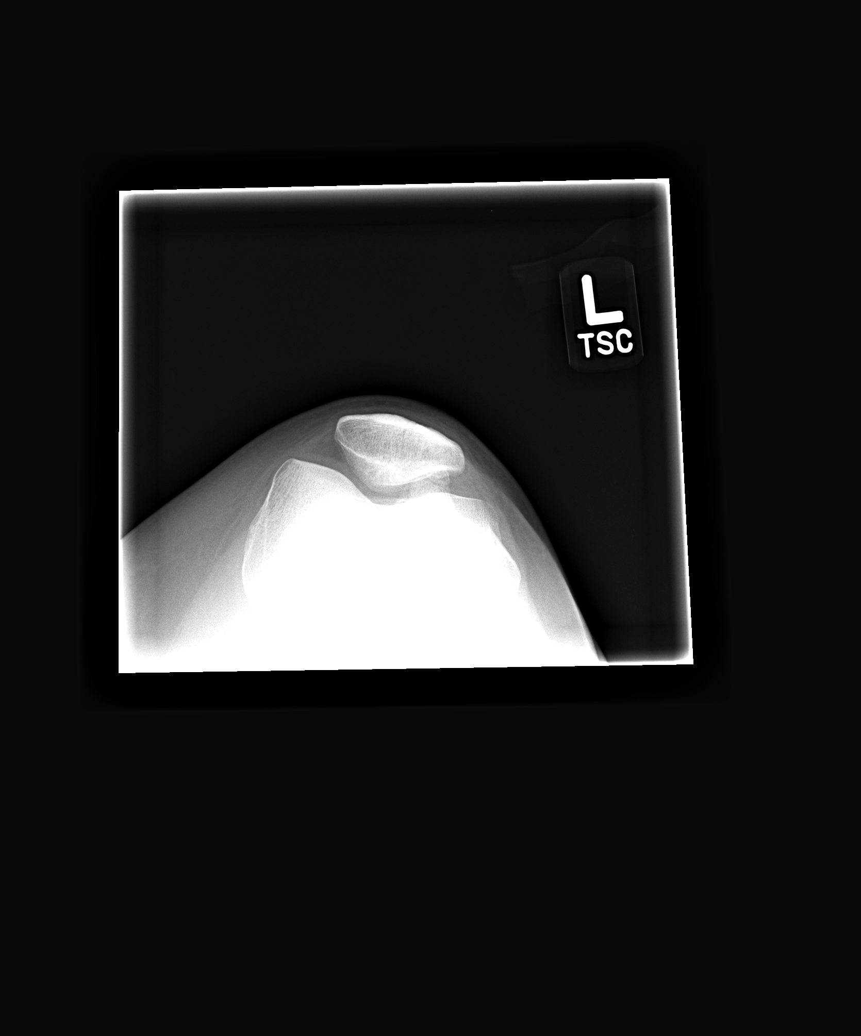

[5 of 5 positions shown; findings below may reference images not displayed]

FINDINGS: There is no evidence of fracture, dislocation, or joint effusion.
There is a well corticated ossific fragment adjacent to the tibial
tuberosity likely reflecting sequela of prior Osgood-Schlatter
disease. There is no evidence of arthropathy or other focal bone
abnormality. Soft tissues are unremarkable.
IMPRESSION: No acute osseous injury of the left knee.

## 2016-11-09 ENCOUNTER — Ambulatory Visit: Payer: 59 | Admitting: Family Medicine

## 2016-12-04 ENCOUNTER — Encounter: Payer: Self-pay | Admitting: Family Medicine

## 2016-12-04 ENCOUNTER — Ambulatory Visit (INDEPENDENT_AMBULATORY_CARE_PROVIDER_SITE_OTHER): Payer: 59 | Admitting: Family Medicine

## 2016-12-04 ENCOUNTER — Other Ambulatory Visit (HOSPITAL_COMMUNITY)
Admission: RE | Admit: 2016-12-04 | Discharge: 2016-12-04 | Disposition: A | Discharge status: Home / Self Care | Payer: 59 | Source: Ambulatory Visit | Attending: Family Medicine | Admitting: Family Medicine

## 2016-12-04 VITALS — BP 122/68 | HR 74 | Temp 98.8°F | Ht 61.5 in | Wt 169.2 lb

## 2016-12-04 DIAGNOSIS — Z01419 Encounter for gynecological examination (general) (routine) without abnormal findings: Secondary | ICD-10-CM | POA: Insufficient documentation

## 2016-12-04 DIAGNOSIS — IMO0001 Reserved for inherently not codable concepts without codable children: Secondary | ICD-10-CM | POA: Insufficient documentation

## 2016-12-04 DIAGNOSIS — Z1151 Encounter for screening for human papillomavirus (HPV): Secondary | ICD-10-CM | POA: Diagnosis not present

## 2016-12-04 DIAGNOSIS — Z30432 Encounter for removal of intrauterine contraceptive device: Secondary | ICD-10-CM | POA: Diagnosis not present

## 2016-12-04 DIAGNOSIS — Z30011 Encounter for initial prescription of contraceptive pills: Secondary | ICD-10-CM | POA: Diagnosis not present

## 2016-12-04 DIAGNOSIS — Z113 Encounter for screening for infections with a predominantly sexual mode of transmission: Secondary | ICD-10-CM | POA: Diagnosis present

## 2016-12-04 LAB — CBC WITH DIFFERENTIAL/PLATELET
Basophils Absolute: 0 10*3/uL (ref 0.0–0.1)
Basophils Relative: 0.2 % (ref 0.0–3.0)
Eosinophils Absolute: 0.1 10*3/uL (ref 0.0–0.7)
Eosinophils Relative: 1.3 % (ref 0.0–5.0)
HEMATOCRIT: 42.6 % (ref 36.0–46.0)
HEMOGLOBIN: 14.4 g/dL (ref 12.0–15.0)
LYMPHS PCT: 35.9 % (ref 12.0–46.0)
Lymphs Abs: 2.7 10*3/uL (ref 0.7–4.0)
MCHC: 33.9 g/dL (ref 30.0–36.0)
MCV: 92.7 fl (ref 78.0–100.0)
Monocytes Absolute: 0.5 10*3/uL (ref 0.1–1.0)
Monocytes Relative: 6.6 % (ref 3.0–12.0)
Neutro Abs: 4.2 10*3/uL (ref 1.4–7.7)
Neutrophils Relative %: 56 % (ref 43.0–77.0)
Platelets: 217 10*3/uL (ref 150.0–400.0)
RBC: 4.6 Mil/uL (ref 3.87–5.11)
RDW: 12.6 % (ref 11.5–15.5)
WBC: 7.5 10*3/uL (ref 4.0–10.5)

## 2016-12-04 LAB — LIPID PANEL
CHOLESTEROL: 129 mg/dL (ref 0–200)
HDL: 51.4 mg/dL (ref >39.00)
LDL CALC: 67 mg/dL (ref 0–99)
NonHDL: 77.3
TRIGLYCERIDES: 51 mg/dL (ref 0.0–149.0)
Total CHOL/HDL Ratio: 3
VLDL: 10.2 mg/dL (ref 0.0–40.0)

## 2016-12-04 LAB — COMPREHENSIVE METABOLIC PANEL
ALT: 11 U/L (ref 0–35)
AST: 14 U/L (ref 0–37)
Albumin: 4.1 g/dL (ref 3.5–5.2)
Alkaline Phosphatase: 45 U/L (ref 39–117)
BUN: 5 mg/dL — ABNORMAL LOW (ref 6–23)
CALCIUM: 9.1 mg/dL (ref 8.4–10.5)
CHLORIDE: 107 meq/L (ref 96–112)
CO2: 30 mEq/L (ref 19–32)
Creatinine, Ser: 0.72 mg/dL (ref 0.40–1.20)
GFR: 123.88 mL/min (ref >60.00)
Glucose, Bld: 117 mg/dL — ABNORMAL HIGH (ref 70–99)
POTASSIUM: 4.1 meq/L (ref 3.5–5.1)
Sodium: 141 mEq/L (ref 135–145)
Total Bilirubin: 0.4 mg/dL (ref 0.2–1.2)
Total Protein: 6.5 g/dL (ref 6.0–8.3)

## 2016-12-04 LAB — TSH: TSH: 0.45 u[IU]/mL (ref 0.35–4.50)

## 2016-12-04 MED ORDER — NORETHINDRONE ACET-ETHINYL EST 1-20 MG-MCG PO TABS: 1.0000 | ORAL_TABLET | Freq: Every day | ORAL | 11 refills | Status: DC

## 2016-12-04 NOTE — Progress Notes (Addendum)
Subjective:   Patient ID: Judy James, female    DOB: 1986/05/03, 31 y.o.   MRN: [id number]  Judy James is a pleasant 32 y.o. year old female who presents to clinic today with Annual Exam (remove IUD)  on 12/04/2016  HPI:  She is sexually active with one partner. IUD is almost 28 years old- she would like it removed today and to start OCPs. Has not had a period in several years.  She feels like she wants to use a form of birth control that will allow her to have monthly periods for awhile.  Due for labs today.     No current outpatient prescriptions on file prior to visit.   No current facility-administered medications on file prior to visit.     Allergies  Allergen Reactions  . Penicillins     Past Medical History:  Diagnosis Date  . Arthritis    knee  . Knee pain   . Migraines     Past Surgical History:  Procedure Laterality Date  . CESAREAN SECTION    . CESAREAN SECTION  2008  . WISDOM TOOTH EXTRACTION      Family History  Problem Relation Age of Onset  . Diabetes Mother   . Diabetes Maternal Aunt   . Hearing loss Maternal Aunt   . Heart disease Maternal Aunt   . Hypertension Maternal Aunt   . Stroke Maternal Aunt   . Alcohol abuse Maternal Uncle   . Diabetes Maternal Uncle   . Drug abuse Maternal Uncle   . Hyperlipidemia Maternal Uncle   . Hypertension Maternal Uncle   . Heart disease Maternal Uncle   . Cancer Maternal Grandmother   . COPD Maternal Grandfather     Social History   Social History  . Marital status: Single    Spouse name: N/A  . Number of children: N/A  . Years of education: N/A   Occupational History  . Not on file.   Social History Main Topics  . Smoking status: Never Smoker  . Smokeless tobacco: Never Used  . Alcohol use 0.0 oz/week     Comment: 0-1  . Drug use: No  . Sexual activity: Yes   Other Topics Concern  . Not on file   Social History Narrative  . No narrative on file   The PMH, PSH, Social  History, Family History, Medications, and allergies have been reviewed in [facility], and have been updated if relevant.   Review of Systems  Constitutional: Negative.   HENT: Negative.   Eyes: Negative.   Respiratory: Negative.   Cardiovascular: Negative.   Gastrointestinal: Negative.   Endocrine: Negative.   Genitourinary: Negative.   Musculoskeletal: Negative.   Skin: Negative.   Allergic/Immunologic: Negative.   Neurological: Negative.   Hematological: Negative.   Psychiatric/Behavioral: Negative.   All other systems reviewed and are negative.      Objective:    Ht 5' 1.5" (1.562 m)   Wt 169 lb 4 oz (76.8 kg)   BMI 31.46 kg/m    Physical Exam   General:  Well-developed,well-nourished,in no acute distress; alert,appropriate and cooperative throughout examination Head:  normocephalic and atraumatic.   Eyes:  vision grossly intact, PERRL Ears:  R ear normal and L ear normal externally, TMs clear bilaterally Nose:  no external deformity.   Mouth:  good dentition.   Neck:  No deformities, masses, or tenderness noted. Breasts:  No mass, nodules, thickening, tenderness, bulging, retraction, inflamation, nipple discharge or skin changes  noted.   Lungs:  Normal respiratory effort, chest expands symmetrically. Lungs are clear to auscultation, no crackles or wheezes. Heart:  Normal rate and regular rhythm. S1 and S2 normal without gallop, murmur, click, rub or other extra sounds. Abdomen:  Bowel sounds positive,abdomen soft and non-tender without masses, organomegaly or hernias noted. Rectal:  no external abnormalities.   Genitalia:  Pelvic Exam:        External: normal female genitalia without lesions or masses        Vagina: normal without lesions or masses        Cervix: normal without lesions or masses, IUD strings visible and IUD is removed with ring forceps.  Pt tolerated well without bleeding or significant cramping.        Adnexa: normal bimanual exam without masses or  fullness        Uterus: normal by palpation        Pap smear: performed Msk:  No deformity or scoliosis noted of thoracic or lumbar spine.   Extremities:  No clubbing, cyanosis, edema, or deformity noted with normal full range of motion of all joints.   Neurologic:  alert & oriented X3 and gait normal.   Skin:  Intact without suspicious lesions or rashes Cervical Nodes:  No lymphadenopathy noted Axillary Nodes:  No palpable lymphadenopathy Psych:  Cognition and judgment appear intact. Alert and cooperative with normal attention span and concentration. No apparent delusions, illusions, hallucinations       Assessment & Plan:   Well woman exam  Encounter for IUD removal No Follow-up on file.

## 2016-12-04 NOTE — Assessment & Plan Note (Signed)
Education given regarding options for contraception, including barrier methods, injectable contraception, IUD placement, oral contraceptives. eRx sent for loestrin. She will call me in a few months with an update. The patient indicates understanding of these issues and agrees with the plan.

## 2016-12-04 NOTE — Assessment & Plan Note (Addendum)
Reviewed preventive care protocols, scheduled due services, and updated immunizations Discussed nutrition, exercise, diet, and healthy lifestyle.  Orders Placed This Encounter  Procedures  . CBC with Differential/Platelet  . Comprehensive metabolic panel  . Lipid panel  . TSH  . HIV antibody (with reflex)  . RPR    

## 2016-12-04 NOTE — Patient Instructions (Signed)
Great to see you. We will call you with your results from today and you can see them online.  Let me know how the birth control pill works.

## 2016-12-04 NOTE — Assessment & Plan Note (Signed)
IUD strings visualized and removed with ringed forceps. Pt tolerated procedure well without bleeding or cramping. Precautions given.

## 2016-12-04 NOTE — Progress Notes (Signed)
Pre visit review using our clinic review tool, if applicable. No additional management support is needed unless otherwise documented below in the visit note. 

## 2016-12-05 LAB — RPR

## 2016-12-05 LAB — HIV ANTIBODY (ROUTINE TESTING W REFLEX): HIV: NONREACTIVE

## 2016-12-06 LAB — CYTOLOGY - PAP
Bacterial vaginitis: POSITIVE — AB
Candida vaginitis: POSITIVE — AB
Chlamydia: NEGATIVE
DIAGNOSIS: NEGATIVE
HPV (WINDOPATH): NOT DETECTED
Neisseria Gonorrhea: NEGATIVE
Trichomonas: NEGATIVE

## 2016-12-07 MED ORDER — FLUCONAZOLE 150 MG PO TABS
150.0000 mg | ORAL_TABLET | Freq: Once | ORAL | 1 refills | Status: AC
Start: 2016-12-07 — End: 2016-12-07

## 2016-12-07 MED ORDER — METRONIDAZOLE 500 MG PO TABS: 500.0000 mg | ORAL_TABLET | Freq: Two times a day (BID) | ORAL | 0 refills | Status: DC

## 2016-12-07 NOTE — Addendum Note (Signed)
Addended by: Modena Nunnery on: 12/07/2016 03:44 PM   Modules accepted: Orders

## 2016-12-08 LAB — CERVICOVAGINAL ANCILLARY ONLY: HERPES (WINDOWPATH): NEGATIVE

## 2017-05-16 ENCOUNTER — Encounter: Payer: Self-pay | Admitting: Family Medicine

## 2017-05-16 ENCOUNTER — Ambulatory Visit (INDEPENDENT_AMBULATORY_CARE_PROVIDER_SITE_OTHER): Payer: 59 | Admitting: Family Medicine

## 2017-05-16 VITALS — BP 120/70 | HR 75 | Ht 61.5 in | Wt 162.0 lb

## 2017-05-16 DIAGNOSIS — N760 Acute vaginitis: Secondary | ICD-10-CM | POA: Diagnosis not present

## 2017-05-16 DIAGNOSIS — Z113 Encounter for screening for infections with a predominantly sexual mode of transmission: Secondary | ICD-10-CM | POA: Diagnosis not present

## 2017-05-16 NOTE — Progress Notes (Signed)
SUBJECTIVE:  31 y.o. female complains of copious and creamy vaginal discharge for 3 day(s). Denies abnormal vaginal bleeding or significant pelvic pain or fever. No UTI symptoms. Denies history of known exposure to STD.   No current outpatient prescriptions on file prior to visit.   No current facility-administered medications on file prior to visit.     Allergies  Allergen Reactions  . Penicillins     Past Medical History:  Diagnosis Date  . Arthritis    knee  . Knee pain   . Migraines     Past Surgical History:  Procedure Laterality Date  . CESAREAN SECTION    . CESAREAN SECTION  2008  . WISDOM TOOTH EXTRACTION      Family History  Problem Relation Age of Onset  . Diabetes Mother   . Diabetes Maternal Aunt   . Hearing loss Maternal Aunt   . Heart disease Maternal Aunt   . Hypertension Maternal Aunt   . Stroke Maternal Aunt   . Alcohol abuse Maternal Uncle   . Diabetes Maternal Uncle   . Drug abuse Maternal Uncle   . Hyperlipidemia Maternal Uncle   . Hypertension Maternal Uncle   . Heart disease Maternal Uncle   . Cancer Maternal Grandmother   . COPD Maternal Grandfather     Social History   Social History  . Marital status: Single    Spouse name: N/A  . Number of children: N/A  . Years of education: N/A   Occupational History  . Not on file.   Social History Main Topics  . Smoking status: Never Smoker  . Smokeless tobacco: Never Used  . Alcohol use 0.0 oz/week     Comment: 0-1  . Drug use: No  . Sexual activity: Yes   Other Topics Concern  . Not on file   Social History Narrative  . No narrative on file   The PMH, PSH, Social History, Family History, Medications, and allergies have been reviewed in [facility], and have been updated if relevant.  No LMP recorded. Patient is not currently having periods (Reason: IUD).  OBJECTIVE:  BP 120/70   Pulse 75   Ht 5' 1.5" (1.562 m)   Wt 162 lb (73.5 kg)   SpO2 98%   BMI 30.11 kg/m   She appears  well, afebrile. Abdomen: benign, soft, nontender, no masses. Pelvic Exam: VAGINA: normal appearing vagina with normal color and discharge, no lesions, vaginal erythema and thick discharge in vault.   ASSESSMENT:  no pathogens identified causing these symptoms  PLAN:  GC and chlamydia DNA  probe sent to lab. Treatment: abstain from coitus during course of treatment and Wet prep sent, HIV and RPR tested as well today. ROV prn if symptoms persist or worsen.

## 2017-05-16 NOTE — Progress Notes (Deleted)
   Subjective:   Patient ID: Judy James, female    DOB: [birth year], 31 y.o.   MRN: [id number]  Judy James is a pleasant 31 y.o. year old female who presents to clinic today with No chief complaint on file.  on 05/16/2017  HPI:    Current Outpatient Prescriptions on File Prior to Visit  Medication Sig Dispense Refill  . norethindrone-ethinyl estradiol (LOESTRIN 1/20, 21,) 1-20 MG-MCG tablet Take 1 tablet by mouth daily. 1 Package 11   No current facility-administered medications on file prior to visit.     Allergies  Allergen Reactions  . Penicillins     Past Medical History:  Diagnosis Date  . Arthritis    knee  . Knee pain   . Migraines     Past Surgical History:  Procedure Laterality Date  . CESAREAN SECTION    . CESAREAN SECTION  2008  . WISDOM TOOTH EXTRACTION      Family History  Problem Relation Age of Onset  . Diabetes Mother   . Diabetes Maternal Aunt   . Hearing loss Maternal Aunt   . Heart disease Maternal Aunt   . Hypertension Maternal Aunt   . Stroke Maternal Aunt   . Alcohol abuse Maternal Uncle   . Diabetes Maternal Uncle   . Drug abuse Maternal Uncle   . Hyperlipidemia Maternal Uncle   . Hypertension Maternal Uncle   . Heart disease Maternal Uncle   . Cancer Maternal Grandmother   . COPD Maternal Grandfather     Social History   Social History  . Marital status: Single    Spouse name: N/A  . Number of children: N/A  . Years of education: N/A   Occupational History  . Not on file.   Social History Main Topics  . Smoking status: Never Smoker  . Smokeless tobacco: Never Used  . Alcohol use 0.0 oz/week     Comment: 0-1  . Drug use: No  . Sexual activity: Yes   Other Topics Concern  . Not on file   Social History Narrative  . No narrative on file   The PMH, PSH, Social History, Family History, Medications, and allergies have been reviewed in [facility], and have been updated if relevant.   Review of Systems     Objective:      There were no vitals taken for this visit.   Physical Exam        Assessment & Plan:   No diagnosis found. No Follow-up on file.

## 2017-05-16 NOTE — Addendum Note (Signed)
Addended by: Mady Haagensen on: 05/16/2017 08:02 AM   Modules accepted: Orders

## 2017-05-16 NOTE — Patient Instructions (Signed)
Great to see you. We will call you with your results from today and you can view them online. 

## 2017-05-16 NOTE — Addendum Note (Signed)
Addended by: Marchia Bond on: 05/16/2017 08:56 AM   Modules accepted: Orders

## 2017-05-17 ENCOUNTER — Other Ambulatory Visit: Payer: Self-pay | Admitting: Family Medicine

## 2017-05-17 LAB — RPR: RPR: NONREACTIVE

## 2017-05-17 LAB — HIV ANTIBODY (ROUTINE TESTING W REFLEX): HIV Screen 4th Generation wRfx: NONREACTIVE

## 2017-05-17 LAB — WET PREP BY MOLECULAR PROBE
Candida species: DETECTED — AB
Gardnerella vaginalis: DETECTED — AB
Trichomonas vaginosis: NOT DETECTED

## 2017-05-17 MED ORDER — FLUCONAZOLE 150 MG PO TABS: 150.0000 mg | ORAL_TABLET | Freq: Once | ORAL | 0 refills | Status: AC

## 2017-05-17 MED ORDER — METRONIDAZOLE 500 MG PO TABS: 500.0000 mg | ORAL_TABLET | Freq: Two times a day (BID) | ORAL | 0 refills | Status: DC

## 2017-05-20 LAB — GC/CHLAMYDIA PROBE AMP
Chlamydia trachomatis, NAA: NEGATIVE
NEISSERIA GONORRHOEAE BY PCR: NEGATIVE

## 2017-07-11 ENCOUNTER — Ambulatory Visit (INDEPENDENT_AMBULATORY_CARE_PROVIDER_SITE_OTHER): Payer: 59 | Admitting: Family Medicine

## 2017-07-11 ENCOUNTER — Encounter: Payer: Self-pay | Admitting: Family Medicine

## 2017-07-11 DIAGNOSIS — N912 Amenorrhea, unspecified: Secondary | ICD-10-CM

## 2017-07-11 LAB — HCG, QUANTITATIVE, PREGNANCY: QUANTITATIVE HCG: 0 m[IU]/mL

## 2017-07-11 LAB — POCT URINE PREGNANCY: Preg Test, Ur: NEGATIVE

## 2017-07-11 NOTE — Progress Notes (Signed)
Subjective:   Patient ID: Judy James, female    DOB: 1986/09/16, 31 y.o.   MRN: [id number]  Judy James is a pleasant 31 y.o. year old female who presents to clinic today with Acute Visit (Per pt her last period was 5 days late, Came on Sept 5 and only lasted a day )  on 07/11/2017  HPI:  Started her period on 9/5 but it it was 5 days late and only lasted a day. This would be unusual for her.  Had IUD but had it taken out last fall.  Not trying to get pregnant.  No cramping or spotting since. No fevers.     No current outpatient prescriptions on file prior to visit.   No current facility-administered medications on file prior to visit.     Allergies  Allergen Reactions  . Penicillins     Past Medical History:  Diagnosis Date  . Arthritis    knee  . Knee pain   . Migraines     Past Surgical History:  Procedure Laterality Date  . CESAREAN SECTION    . CESAREAN SECTION  2008  . WISDOM TOOTH EXTRACTION      Family History  Problem Relation Age of Onset  . Diabetes Mother   . Diabetes Maternal Aunt   . Hearing loss Maternal Aunt   . Heart disease Maternal Aunt   . Hypertension Maternal Aunt   . Stroke Maternal Aunt   . Alcohol abuse Maternal Uncle   . Diabetes Maternal Uncle   . Drug abuse Maternal Uncle   . Hyperlipidemia Maternal Uncle   . Hypertension Maternal Uncle   . Heart disease Maternal Uncle   . Cancer Maternal Grandmother   . COPD Maternal Grandfather     Social History   Social History  . Marital status: Single    Spouse name: N/A  . Number of children: N/A  . Years of education: N/A   Occupational History  . Not on file.   Social History Main Topics  . Smoking status: Never Smoker  . Smokeless tobacco: Never Used  . Alcohol use 0.0 oz/week     Comment: 0-1  . Drug use: No  . Sexual activity: Yes   Other Topics Concern  . Not on file   Social History Narrative  . No narrative on file   The PMH, PSH, Social  History, Family History, Medications, and allergies have been reviewed in [facility], and have been updated if relevant.   Review of Systems  Genitourinary: Positive for menstrual problem. Negative for decreased urine volume, difficulty urinating, dyspareunia, dysuria, enuresis, flank pain, frequency, genital sores, hematuria, pelvic pain, urgency, vaginal bleeding, vaginal discharge and vaginal pain.       Objective:    BP 108/80 (BP Location: Left Arm, Patient Position: Sitting, Cuff Size: Normal)   Pulse 73   Temp 98.2 F (36.8 C) (Oral)   Ht 5' 1.5" (1.562 m)   Wt 166 lb 1.6 oz (75.3 kg)   SpO2 98%   BMI 30.88 kg/m    Physical Exam    General:  Well-developed,well-nourished,in no acute distress; alert,appropriate and cooperative throughout examination Head:  normocephalic and atraumatic.   Abdomen:  Bowel sounds positive,abdomen soft and non-tender without masses, organomegaly or hernias noted. Msk:  No deformity or scoliosis noted of thoracic or lumbar spine.   Extremities:  No clubbing, cyanosis, edema, or deformity noted with normal full range of motion of all joints.   Neurologic:  alert & oriented X3 and gait normal.   Skin:  Intact without suspicious lesions or rashes Psych:  Cognition and judgment appear intact. Alert and cooperative with normal attention span and concentration. No apparent delusions, illusions, hallucinations      Assessment & Plan:   Amenorrhea - Plan: hCG, quantitative, pregnancy No Follow-up on file.

## 2017-07-11 NOTE — Addendum Note (Signed)
Addended by: Marin Roberts on: 07/11/2017 08:33 AM   Modules accepted: Orders

## 2017-07-11 NOTE — Assessment & Plan Note (Signed)
Neg Upreg- reassuring recent period as well. Will check hcg quant for pt reassurance. Likely either early missed abortion or irregular one time period. The patient indicates understanding of these issues and agrees with the plan.

## 2017-12-03 ENCOUNTER — Ambulatory Visit: Payer: 59 | Admitting: Family Medicine

## 2017-12-03 ENCOUNTER — Other Ambulatory Visit (HOSPITAL_COMMUNITY)
Admission: RE | Admit: 2017-12-03 | Discharge: 2017-12-03 | Disposition: A | Discharge status: Home / Self Care | Payer: Managed Care, Other (non HMO) | Source: Ambulatory Visit | Attending: Family Medicine | Admitting: Family Medicine

## 2017-12-03 ENCOUNTER — Encounter: Payer: Self-pay | Admitting: Family Medicine

## 2017-12-03 VITALS — BP 134/82 | HR 76 | Temp 99.2°F | Ht 61.5 in | Wt 170.2 lb

## 2017-12-03 DIAGNOSIS — Z202 Contact with and (suspected) exposure to infections with a predominantly sexual mode of transmission: Secondary | ICD-10-CM

## 2017-12-03 DIAGNOSIS — Z23 Encounter for immunization: Secondary | ICD-10-CM | POA: Diagnosis not present

## 2017-12-03 NOTE — Patient Instructions (Signed)
Great to see you. We will call you with your lab results from today and you can view them online.

## 2017-12-03 NOTE — Addendum Note (Signed)
Addended by: Marrion Coy on: 12/03/2017 12:24 PM   Modules accepted: Orders

## 2017-12-03 NOTE — Assessment & Plan Note (Signed)
Discussed sexual activity, pregnancy risk, and STD risk.  STD screening today. Orders Placed This Encounter  Procedures  . RPR  . HIV antibody (with reflex)

## 2017-12-03 NOTE — Addendum Note (Signed)
Addended by: Lynnea Ferrier on: 12/03/2017 08:38 AM   Modules accepted: Orders

## 2017-12-03 NOTE — Addendum Note (Signed)
Addended by: Marrion Coy on: 12/03/2017 09:35 AM   Modules accepted: Orders

## 2017-12-03 NOTE — Progress Notes (Signed)
Subjective:   Patient ID: Judy James, female    DOB: 12/22/1985, 32 y.o.   MRN: [id number]  Judy James is a pleasant 32 y.o. year old female who presents to clinic today with Exposure to STD (Patient is here today due to boyfriend being Dx with Chlamydia.  She found out on 2.6.2019.)  on 12/03/2017  HPI:  Boyfriend diagnosed with chlamydia on 12/03/17. He is being treated. She denies any vaginal discharge, dysuria, pelvic pain or other GU symptoms.  No current outpatient medications on file prior to visit.   No current facility-administered medications on file prior to visit.     Allergies  Allergen Reactions  . Penicillins     Past Medical History:  Diagnosis Date  . Arthritis    knee  . Knee pain   . Migraines     Past Surgical History:  Procedure Laterality Date  . CESAREAN SECTION    . CESAREAN SECTION  2008  . WISDOM TOOTH EXTRACTION      Family History  Problem Relation Age of Onset  . Diabetes Mother   . Diabetes Maternal Aunt   . Hearing loss Maternal Aunt   . Heart disease Maternal Aunt   . Hypertension Maternal Aunt   . Stroke Maternal Aunt   . Alcohol abuse Maternal Uncle   . Diabetes Maternal Uncle   . Drug abuse Maternal Uncle   . Hyperlipidemia Maternal Uncle   . Hypertension Maternal Uncle   . Heart disease Maternal Uncle   . Cancer Maternal Grandmother   . COPD Maternal Grandfather     Social History   Socioeconomic History  . Marital status: Single    Spouse name: Not on file  . Number of children: Not on file  . Years of education: Not on file  . Highest education level: Not on file  Social Needs  . Financial resource strain: Not on file  . Food insecurity - worry: Not on file  . Food insecurity - inability: Not on file  . Transportation needs - medical: Not on file  . Transportation needs - non-medical: Not on file  Occupational History  . Not on file  Tobacco Use  . Smoking status: Never Smoker  . Smokeless tobacco:  Never Used  Substance and Sexual Activity  . Alcohol use: Yes    Alcohol/week: 0.0 oz    Comment: 0-1  . Drug use: No  . Sexual activity: Yes  Other Topics Concern  . Not on file  Social History Narrative  . Not on file   The PMH, PSH, Social History, Family History, Medications, and allergies have been reviewed in [facility], and have been updated if relevant.   Review of Systems  Gastrointestinal: Negative.   Genitourinary: Negative.   All other systems reviewed and are negative.      Objective:    BP 134/82 (BP Location: Left Arm, Patient Position: Sitting, Cuff Size: Normal)   Pulse 76   Temp 99.2 F (37.3 C) (Oral)   Ht 5' 1.5" (1.562 m)   Wt 170 lb 3.2 oz (77.2 kg)   LMP 11/30/2017   SpO2 99%   BMI 31.64 kg/m    Physical Exam  Constitutional: She appears well-developed and well-nourished. No distress.  HENT:  Head: Normocephalic and atraumatic.  Eyes: Conjunctivae are normal.  Cardiovascular: Normal rate.  Pulmonary/Chest: Effort normal.  Genitourinary: Vagina normal and uterus normal. Cervix exhibits no motion tenderness, no discharge and no friability.  Musculoskeletal: Normal range  of motion.  Skin: Skin is warm and dry. She is not diaphoretic.  Psychiatric: She has a normal mood and affect. Her behavior is normal. Judgment and thought content normal.  Nursing note and vitals reviewed.         Assessment & Plan:   STD exposure - Plan: RPR, HIV antibody (with reflex), Cytology - PAP, CANCELED: HSV(herpes simplex vrs) 1+2 ab-IgM No Follow-up on file.

## 2017-12-04 LAB — CYTOLOGY - PAP
Chlamydia: NEGATIVE
DIAGNOSIS: NEGATIVE
Neisseria Gonorrhea: NEGATIVE
Trichomonas: NEGATIVE

## 2017-12-04 LAB — RPR: RPR Ser Ql: NONREACTIVE

## 2017-12-04 LAB — HIV ANTIBODY (ROUTINE TESTING W REFLEX): HIV Screen 4th Generation wRfx: NONREACTIVE

## 2017-12-06 LAB — SPECIMEN STATUS REPORT

## 2017-12-08 LAB — HSV(HERPES SIMPLEX VRS) I + II AB-IGM: HSVI/II Comb IgM: <0.91 Ratio (ref 0.00–0.90)

## 2018-10-25 ENCOUNTER — Ambulatory Visit (INDEPENDENT_AMBULATORY_CARE_PROVIDER_SITE_OTHER): Payer: BLUE CROSS/BLUE SHIELD

## 2018-10-25 DIAGNOSIS — Z3201 Encounter for pregnancy test, result positive: Secondary | ICD-10-CM

## 2018-10-25 DIAGNOSIS — Z3200 Encounter for pregnancy test, result unknown: Secondary | ICD-10-CM

## 2018-10-25 LAB — POCT URINE PREGNANCY: Preg Test, Ur: POSITIVE — AB

## 2018-10-25 NOTE — Progress Notes (Signed)
..   Ms. Barnfield presents today for UPT. She has no unusual complaints. LMP:08-26-18    OBJECTIVE: Appears well, in no apparent distress.  OB History   No obstetric history on file.    Home UPT Result: Positive In-Office UPT result: Positive I have reviewed the patient's medical, obstetrical, social, and family histories, and medications.   ASSESSMENT: Positive pregnancy test  PLAN Prenatal care to be completed at: [facility]

## 2018-11-13 ENCOUNTER — Ambulatory Visit (INDEPENDENT_AMBULATORY_CARE_PROVIDER_SITE_OTHER): Payer: BLUE CROSS/BLUE SHIELD | Admitting: Certified Nurse Midwife

## 2018-11-13 ENCOUNTER — Ambulatory Visit (HOSPITAL_COMMUNITY)
Admission: RE | Admit: 2018-11-13 | Discharge: 2018-11-13 | Disposition: A | Discharge status: Home / Self Care | Payer: BLUE CROSS/BLUE SHIELD | Source: Ambulatory Visit | Attending: Certified Nurse Midwife | Admitting: Certified Nurse Midwife

## 2018-11-13 ENCOUNTER — Encounter: Payer: Self-pay | Admitting: Certified Nurse Midwife

## 2018-11-13 VITALS — BP 115/69 | HR 70 | Wt 173.0 lb

## 2018-11-13 DIAGNOSIS — O020 Blighted ovum and nonhydatidiform mole: Secondary | ICD-10-CM | POA: Diagnosis not present

## 2018-11-13 DIAGNOSIS — Z3A08 8 weeks gestation of pregnancy: Secondary | ICD-10-CM | POA: Diagnosis not present

## 2018-11-13 DIAGNOSIS — O3481 Maternal care for other abnormalities of pelvic organs, first trimester: Secondary | ICD-10-CM | POA: Diagnosis not present

## 2018-11-13 DIAGNOSIS — O34219 Maternal care for unspecified type scar from previous cesarean delivery: Secondary | ICD-10-CM | POA: Insufficient documentation

## 2018-11-13 NOTE — Addendum Note (Signed)
Addended by: Lewie Loron D on: 11/13/2018 01:03 PM   Modules accepted: Orders

## 2018-11-13 NOTE — Progress Notes (Signed)
History:  Judy James is a 33 y.o. G2P1001 at [redacted]w[redacted]d by LMP being seen today for her first obstetrical visit.  Her obstetrical history is significant for history of cesarean section for fetal distress. Patient does intend to breast feed. Pregnancy history fully reviewed.  Patient reports no complaints. She denies abdominal cramping or vaginal bleeding, reports having 1 occurrence of vaginal spotting that lasted for 1-2 days, but is unsure of date.   The following portions of the patient's history were reviewed and updated as appropriate: allergies, current medications, family history, past medical history, social history, past surgical history and problem list.  HISTORY: OB History  Gravida Para Term Preterm AB Living  2 1 1  0 0 1  SAB TAB Ectopic Multiple Live Births  0 0 0 0 1    # Outcome Date GA Lbr Len/2nd Weight Sex Delivery Anes PTL Lv  2 Current           1 Term 01/17/07 [redacted]w[redacted]d   F CS-LTranv   LIV     Complications: Fetal Intolerance    Last pap smear was done 11/2017 and was normal  Past Medical History:  Diagnosis Date  . Arthritis    knee  . Knee pain   . Migraines    Past Surgical History:  Procedure Laterality Date  . CESAREAN SECTION    . CESAREAN SECTION  2008  . WISDOM TOOTH EXTRACTION     Family History  Problem Relation Age of Onset  . Diabetes Mother   . Diabetes Maternal Aunt   . Hearing loss Maternal Aunt   . Heart disease Maternal Aunt   . Hypertension Maternal Aunt   . Stroke Maternal Aunt   . Alcohol abuse Maternal Uncle   . Diabetes Maternal Uncle   . Drug abuse Maternal Uncle   . Hyperlipidemia Maternal Uncle   . Hypertension Maternal Uncle   . Heart disease Maternal Uncle   . Cancer Maternal Grandmother   . COPD Maternal Grandfather    Social History   Tobacco Use  . Smoking status: Never Smoker  . Smokeless tobacco: Never Used  Substance Use Topics  . Alcohol use: Yes    Alcohol/week: 0.0 standard drinks    Comment: 0-1  .  Drug use: No   Allergies  Allergen Reactions  . Penicillins    Current Outpatient Medications on File Prior to Visit  Medication Sig Dispense Refill  . Multiple Vitamin (MULTIVITAMIN) capsule Take 1 capsule by mouth daily.     No current facility-administered medications on file prior to visit.     Review of Systems Pertinent items noted in HPI and remainder of comprehensive ROS otherwise negative.    Objective:  Physical examination:  Vitals:   11/13/18 1042  BP: 115/69  Pulse: 70  Weight: 173 lb (78.5 kg)   System: General: well-developed, well-nourished female in no acute distress   Skin: normal coloration and turgor, no rashes   Neurologic: oriented, normal, negative, normal mood   Extremities: normal strength, tone, and muscle mass, ROM of all joints is normal   HEENT PERRLA, extraocular movement intact and sclera clear, anicteric   Mouth/Teeth mucous membranes moist, pharynx normal without lesions and dental hygiene good   Neck supple and no masses   Cardiovascular: regular rate and rhythm   Respiratory:  no respiratory distress, normal breath sounds   Abdomen: soft, non-tender; bowel sounds normal; no masses,  no organomegaly   Bedside Ultrasound for FHR check: Patient  informed that the ultrasound is considered a limited obstetric ultrasound and is not intended to be a complete ultrasound exam.  Patient also informed that the ultrasound is not being completed with the intent of assessing for fetal or placental anomalies or any pelvic abnormalities.  Explained that the purpose of today's ultrasound is to assess for fetal heart rate.  Patient acknowledges the purpose of the exam and the limitations of the study.    No embryo or yolk sac seen on bedside US, finding consistent with empty gestational sac.   Assessment & Plan:  1. Empty gestational sac with ongoing pregnancy - Discussed findings of bedside US with patient, scheduled for formal US this afternoon  - Educated  on early pregnancy or missed abortion  - Will call patient with findings and discuss options dependent on results, patient agrees to plan of care  - CBC - B-HCG Quant - US OB LESS THAN 14 Glenn; Future  Lajean Manes, CNM 11/13/2018 12:00 PM

## 2018-11-14 ENCOUNTER — Other Ambulatory Visit: Payer: Self-pay | Admitting: Certified Nurse Midwife

## 2018-11-14 ENCOUNTER — Telehealth: Payer: Self-pay

## 2018-11-14 DIAGNOSIS — O021 Missed abortion: Secondary | ICD-10-CM

## 2018-11-14 LAB — CBC
Hematocrit: 39.9 % (ref 34.0–46.6)
Hemoglobin: 13.6 g/dL (ref 11.1–15.9)
MCH: 30.4 pg (ref 26.6–33.0)
MCHC: 34.1 g/dL (ref 31.5–35.7)
MCV: 89 fL (ref 79–97)
Platelets: 264 10*3/uL (ref 150–450)
RBC: 4.47 x10E6/uL (ref 3.77–5.28)
RDW: 13.1 % (ref 11.7–15.4)
WBC: 8.7 10*3/uL (ref 3.4–10.8)

## 2018-11-14 LAB — BETA HCG QUANT (REF LAB): hCG Quant: 30247 m[IU]/mL

## 2018-11-14 MED ORDER — ACETAMINOPHEN-CODEINE #3 300-30 MG PO TABS: 1.0000 | ORAL_TABLET | ORAL | 0 refills | Status: AC | PRN

## 2018-11-14 MED ORDER — ONDANSETRON 4 MG PO TBDP: 4.0000 mg | ORAL_TABLET | Freq: Three times a day (TID) | ORAL | 0 refills | Status: AC | PRN

## 2018-11-14 MED ORDER — MISOPROSTOL 200 MCG PO TABS: ORAL_TABLET | ORAL | 1 refills | Status: DC

## 2018-11-14 NOTE — Progress Notes (Signed)
Patient called and notified of [place] results that confirmed failed pregnancy. [place] reports shows Gestational sac without yolk sac or embryo. Based on sac size and absence of an embryo, findings meet definitive criteria for failed pregnancy.   Discussed with patient options of failed pregnancy with expectant management, medical management or surgical management. Discussed in detail options. Patient agrees to Medical management with Cytotec administration.   Rx for Cytotec, zofran and Tylenol #3 sent to pharmacy of choice. Discussed what to expect with vaginal bleeding and reasons to call office or go to [facility]. Return to office in 1 week for repeat labs and follow up from miscarriage.    Lajean Manes, CNM 11/14/18, 9:36 AM

## 2018-11-14 NOTE — Telephone Encounter (Signed)
Returned call and advised pt that provider will call back to go over u/s results.

## 2018-11-21 ENCOUNTER — Encounter: Payer: Self-pay | Admitting: Obstetrics and Gynecology

## 2018-11-21 ENCOUNTER — Ambulatory Visit: Payer: Managed Care, Other (non HMO) | Admitting: Obstetrics and Gynecology

## 2018-11-23 ENCOUNTER — Emergency Department (HOSPITAL_COMMUNITY)
Admission: EM | Admit: 2018-11-23 | Discharge: 2018-11-23 | Disposition: A | Discharge status: Home / Self Care | Payer: BLUE CROSS/BLUE SHIELD | Attending: Emergency Medicine | Admitting: Emergency Medicine

## 2018-11-23 ENCOUNTER — Other Ambulatory Visit: Payer: Self-pay

## 2018-11-23 ENCOUNTER — Encounter (HOSPITAL_COMMUNITY): Payer: Self-pay

## 2018-11-23 ENCOUNTER — Emergency Department (HOSPITAL_COMMUNITY): Payer: BLUE CROSS/BLUE SHIELD

## 2018-11-23 DIAGNOSIS — D62 Acute posthemorrhagic anemia: Secondary | ICD-10-CM | POA: Insufficient documentation

## 2018-11-23 DIAGNOSIS — Z79899 Other long term (current) drug therapy: Secondary | ICD-10-CM | POA: Insufficient documentation

## 2018-11-23 DIAGNOSIS — O034 Incomplete spontaneous abortion without complication: Secondary | ICD-10-CM

## 2018-11-23 DIAGNOSIS — Z3A11 11 weeks gestation of pregnancy: Secondary | ICD-10-CM | POA: Insufficient documentation

## 2018-11-23 DIAGNOSIS — O200 Threatened abortion: Secondary | ICD-10-CM | POA: Diagnosis not present

## 2018-11-23 DIAGNOSIS — N939 Abnormal uterine and vaginal bleeding, unspecified: Secondary | ICD-10-CM

## 2018-11-23 DIAGNOSIS — O021 Missed abortion: Secondary | ICD-10-CM

## 2018-11-23 LAB — CBC
HCT: 29.8 % — ABNORMAL LOW (ref 36.0–46.0)
Hemoglobin: 9.8 g/dL — ABNORMAL LOW (ref 12.0–15.0)
MCH: 30.5 pg (ref 26.0–34.0)
MCHC: 32.9 g/dL (ref 30.0–36.0)
MCV: 92.8 fL (ref 80.0–100.0)
Platelets: 238 10*3/uL (ref 150–400)
RBC: 3.21 MIL/uL — ABNORMAL LOW (ref 3.87–5.11)
RDW: 12.6 % (ref 11.5–15.5)
WBC: 11.3 10*3/uL — ABNORMAL HIGH (ref 4.0–10.5)
nRBC: 0 % (ref 0.0–0.2)

## 2018-11-23 LAB — BASIC METABOLIC PANEL
Anion gap: 8 (ref 5–15)
BUN: <5 mg/dL — ABNORMAL LOW (ref 6–20)
CO2: 25 mmol/L (ref 22–32)
CREATININE: 0.59 mg/dL (ref 0.44–1.00)
Calcium: 8.9 mg/dL (ref 8.9–10.3)
Chloride: 104 mmol/L (ref 98–111)
GFR calc non Af Amer: >60 mL/min (ref >60)
Glucose, Bld: 147 mg/dL — ABNORMAL HIGH (ref 70–99)
Potassium: 3.8 mmol/L (ref 3.5–5.1)
Sodium: 137 mmol/L (ref 135–145)

## 2018-11-23 LAB — HCG, QUANTITATIVE, PREGNANCY: hCG, Beta Chain, Quant, S: 6087 m[IU]/mL — ABNORMAL HIGH (ref <5)

## 2018-11-23 LAB — CBC WITH DIFFERENTIAL/PLATELET
Abs Immature Granulocytes: 0.07 10*3/uL (ref 0.00–0.07)
Basophils Absolute: 0 10*3/uL (ref 0.0–0.1)
Basophils Relative: 0 %
Eosinophils Absolute: 0 10*3/uL (ref 0.0–0.5)
Eosinophils Relative: 0 %
HCT: 32.2 % — ABNORMAL LOW (ref 36.0–46.0)
Hemoglobin: 10.5 g/dL — ABNORMAL LOW (ref 12.0–15.0)
IMMATURE GRANULOCYTES: 1 %
Lymphocytes Relative: 17 %
Lymphs Abs: 1.9 10*3/uL (ref 0.7–4.0)
MCH: 30.3 pg (ref 26.0–34.0)
MCHC: 32.6 g/dL (ref 30.0–36.0)
MCV: 93.1 fL (ref 80.0–100.0)
MONOS PCT: 6 %
Monocytes Absolute: 0.7 10*3/uL (ref 0.1–1.0)
Neutro Abs: 8.9 10*3/uL — ABNORMAL HIGH (ref 1.7–7.7)
Neutrophils Relative %: 76 %
PLATELETS: 243 10*3/uL (ref 150–400)
RBC: 3.46 MIL/uL — ABNORMAL LOW (ref 3.87–5.11)
RDW: 12.7 % (ref 11.5–15.5)
WBC: 11.6 10*3/uL — ABNORMAL HIGH (ref 4.0–10.5)
nRBC: 0 % (ref 0.0–0.2)

## 2018-11-23 MED ORDER — SODIUM CHLORIDE 0.9 % IV BOLUS
1000.0000 mL | Freq: Once | INTRAVENOUS | Status: AC
  Administered 2018-11-23: 1000 mL via INTRAVENOUS

## 2018-11-23 MED ORDER — MISOPROSTOL 200 MCG PO TABS: ORAL_TABLET | ORAL | 1 refills | Status: DC

## 2018-11-23 MED ORDER — FERROUS SULFATE 325 (65 FE) MG PO TABS: 325.0000 mg | ORAL_TABLET | Freq: Every day | ORAL | 0 refills | Status: DC

## 2018-11-23 MED ORDER — HYDROCODONE-ACETAMINOPHEN 5-325 MG PO TABS: 1.0000 | ORAL_TABLET | Freq: Four times a day (QID) | ORAL | 0 refills | Status: DC | PRN

## 2018-11-23 NOTE — ED Provider Notes (Addendum)
[facility] DEPT Provider Note   CSN: [id number] Arrival date & time: 11/23/18  1040     History   Chief Complaint Chief Complaint  Patient presents with  . Vaginal Bleeding    HPI Judy James is a 33 y.o. female presenting today for increased vaginal bleeding.  Patient diagnosed on 11/13/2026 with miscarriage, she was approximate [redacted] weeks pregnant at the time, she was given Cytotec and follow-up instructions.  Patient states that the next day she passed what she believed to be a fetus and gestational sac.  Patient reports that she has had continued heavy vaginal bleeding since that time and became concerned which is why she presents to the ER today.  She states that she attempted to follow-up with her OB/GYN however was unable to.  Additionally patient states she has been feeling more fatigued over the past few days and attributes this to her heavy vaginal bleeding.  Patient describes a mild lower abdominal cramping sensation since her miscarriage began however denies new or worsening pain.  She denies fever, dysuria, hematuria, nausea/vomiting, upper abdominal pain, chest pain or shortness of breath.  HPI  Past Medical History:  Diagnosis Date  . Arthritis    knee  . Knee pain   . Migraines     Patient Active Problem List   Diagnosis Date Noted  . Incomplete abortion 11/26/2018  . STD exposure 12/03/2017  . Amenorrhea 07/11/2017  . Screen for STD (sexually transmitted disease) 05/16/2017    Past Surgical History:  Procedure Laterality Date  . CESAREAN SECTION    . CESAREAN SECTION  2008  . DILATION AND EVACUATION N/A 11/26/2018   Procedure: DILATATION AND EVACUATION;  Surgeon: Aletha Halim, MD;  Location: [facility];  Service: Gynecology;  Laterality: N/A;  . WISDOM TOOTH EXTRACTION       OB History    Gravida  2   Para  1   Term  1   Preterm      AB      Living  1     SAB      TAB      Ectopic      Multiple      Live Births  1            Home Medications    Prior to Admission medications   Medication Sig Start Date End Date Taking? Authorizing Provider  Multiple Vitamin (MULTIVITAMIN) capsule Take 1 capsule by mouth daily.   Yes [provider]  docusate sodium (COLACE) 100 MG capsule Take 1 capsule (100 mg total) by mouth 2 (two) times daily as needed. 11/26/18   Aletha Halim, MD  ferrous sulfate 325 (65 FE) MG tablet Take 1 tablet (325 mg total) by mouth 2 (two) times daily with a meal for 30 days. 11/26/18 12/26/18  Aletha Halim, MD  HYDROcodone-acetaminophen (NORCO/VICODIN) 5-325 MG tablet Take 1 tablet by mouth every 6 (six) hours as needed for severe pain. 11/23/18   Lorin Glass, PA-C  ibuprofen (ADVIL,MOTRIN) 600 MG tablet Take 1 tablet (600 mg total) by mouth every 6 (six) hours as needed. 11/26/18   Aletha Halim, MD  ondansetron (ZOFRAN ODT) 4 MG disintegrating tablet Take 1 tablet (4 mg total) by mouth every 8 (eight) hours as needed for nausea or vomiting. Patient not taking: Reported on 11/23/2018 11/14/18   Lajean Manes, CNM    Family History Family History  Problem Relation Age of Onset  . Diabetes Mother   .  Diabetes Maternal Aunt   . Hearing loss Maternal Aunt   . Heart disease Maternal Aunt   . Hypertension Maternal Aunt   . Stroke Maternal Aunt   . Alcohol abuse Maternal Uncle   . Diabetes Maternal Uncle   . Drug abuse Maternal Uncle   . Hyperlipidemia Maternal Uncle   . Hypertension Maternal Uncle   . Heart disease Maternal Uncle   . Cancer Maternal Grandmother   . COPD Maternal Grandfather     Social History Social History   Tobacco Use  . Smoking status: Never Smoker  . Smokeless tobacco: Never Used  Substance Use Topics  . Alcohol use: Yes    Alcohol/week: 0.0 standard drinks    Comment: 0-1  . Drug use: No     Allergies   Penicillin g   Review of Systems Review of Systems  Constitutional: Negative.  Negative for chills  and fever.  Respiratory: Negative.  Negative for cough and shortness of breath.   Cardiovascular: Negative.  Negative for chest pain.  Gastrointestinal: Negative.  Negative for abdominal pain, diarrhea, nausea and vomiting.  Genitourinary: Positive for pelvic pain (Mild Cramping) and vaginal bleeding. Negative for dysuria and hematuria.  Musculoskeletal: Negative.  Negative for arthralgias and myalgias.  Neurological: Negative.  Negative for dizziness, weakness and headaches.  All other systems reviewed and are negative.   Physical Exam Updated Vital Signs BP 112/82   Pulse 80   Temp 99.3 F (37.4 C) (Oral)   Resp 15   SpO2 100%   Physical Exam Constitutional:      General: She is not in acute distress.    Appearance: Normal appearance. She is well-developed. She is not ill-appearing or diaphoretic.  HENT:     Head: Normocephalic and atraumatic.     Right Ear: External ear normal.     Left Ear: External ear normal.     Nose: Nose normal.     Mouth/Throat:     Mouth: Mucous membranes are moist.     Pharynx: Oropharynx is clear.  Eyes:     Extraocular Movements: Extraocular movements intact.     Conjunctiva/sclera: Conjunctivae normal.     Pupils: Pupils are equal, round, and reactive to light.  Neck:     Musculoskeletal: Normal range of motion.     Trachea: Trachea normal. No tracheal deviation.  Cardiovascular:     Rate and Rhythm: Normal rate and regular rhythm.     Pulses: Normal pulses.          Dorsalis pedis pulses are 2+ on the right side and 2+ on the left side.       Posterior tibial pulses are 2+ on the right side and 2+ on the left side.     Heart sounds: Normal heart sounds.  Pulmonary:     Effort: Pulmonary effort is normal. No respiratory distress.     Breath sounds: Normal breath sounds. No rhonchi.  Abdominal:     General: Bowel sounds are normal. There is no distension.     Palpations: Abdomen is soft.     Tenderness: There is no abdominal tenderness.  There is no guarding or rebound.  Genitourinary:    Comments: Pelvic examination chaperoned by Patty RN.  Pelvic exam: normal external genitalia without evidence of trauma. VULVA: normal appearing vulva with no masses, tenderness or lesion. VAGINA: normal appearing with moderate amount of blood clots present. CERVIX: normal appearing cervix without lesions, cervical motion tenderness absent, cervical with small amount of blood  present. Adnexa and uterus difficult to palpate due to body habitus, no tenderness on exam. Musculoskeletal: Normal range of motion.  Feet:     Right foot:     Protective Sensation: 3 sites tested. 3 sites sensed.     Left foot:     Protective Sensation: 3 sites tested. 3 sites sensed.  Skin:    General: Skin is warm and dry.     Capillary Refill: Capillary refill takes less than 2 seconds.  Neurological:     Mental Status: She is alert.     GCS: GCS eye subscore is 4. GCS verbal subscore is 5. GCS motor subscore is 6.     Comments: Speech is clear and goal oriented, follows commands Major Cranial nerves without deficit, no facial droop Moves extremities without ataxia, coordination intact Normal gait  Psychiatric:        Behavior: Behavior normal.     ED Treatments / Results  Labs (all labs ordered are listed, but only abnormal results are displayed) Labs Reviewed  CBC WITH DIFFERENTIAL/PLATELET - Abnormal; Notable for the following components:      Result Value   WBC 11.6 (*)    RBC 3.46 (*)    Hemoglobin 10.5 (*)    HCT 32.2 (*)    Neutro Abs 8.9 (*)    All other components within normal limits  BASIC METABOLIC PANEL - Abnormal; Notable for the following components:   Glucose, Bld 147 (*)    BUN <5 (*)    All other components within normal limits  HCG, QUANTITATIVE, PREGNANCY - Abnormal; Notable for the following components:   hCG, Beta Chain, Quant, S 6,087 (*)    All other components within normal limits  CBC - Abnormal; Notable for the  following components:   WBC 11.3 (*)    RBC 3.21 (*)    Hemoglobin 9.8 (*)    HCT 29.8 (*)    All other components within normal limits    EKG None  Radiology No results found.  Procedures Procedures (including critical care time)  Medications Ordered in ED Medications  sodium chloride 0.9 % bolus 1,000 mL (0 mLs Intravenous Stopped 11/23/18 1904)  sodium chloride 0.9 % bolus 1,000 mL (0 mLs Intravenous Stopped 11/23/18 1904)     Initial Impression / Assessment and Plan / ED Course  I have reviewed the triage vital signs and the nursing notes.  Pertinent labs & imaging results that were available during my care of the patient were reviewed by me and considered in my medical decision making (see chart for details).  Clinical Course as of Nov 28 1343  Sat Nov 23, 2018  1410 Pelvic Exam Chaperoned by Chong Sicilian RN.   [BM]  2426 On call OBGYN: [place]; Cytotec, Iron   [BM]  1840 Spoke with on call OB/GYN recommended 810mcg cytotecBuccally- stay for 30 minutes. Ibuprofen, heating pad, few days of narc if needed.    [EH]    Clinical Course User Index [BM] Deliah Boston, PA-C [EH] Lorin Glass, PA-C   33 year old female presenting approximately 10 days after diagnosis of miscarriage, patient treated with Cytotec by OB/GYN and given follow-up.  Patient with follow-up next Tuesday however was concerned due to amount of bleeding so she presented to the ER.  Patient without history of fever, nausea/vomiting or increase in pelvic pain.  She does endorse mild cramps but states that these are normal for her and feel like period cramps.  hCG quantitative 6087; 10 days  ago this was 30,247 CBC shows leukocytosis of 11.6 and hemoglobin of 10.5, hemoglobin 10 days ago was 13.6 BMP nonacute ------------------------------------------ Physical examination today reveals moderate amount of blood clots in the vaginal vault.  No acute tenderness on exam today.  Patient given fluid bolus,  states that she feels improved she is ambulatory on emergency department without assistance or difficulty. ------------------------------------------- Case discussed with Dr. Vallery Ridge who recommends consult to OB/GYN. ------------------------------------------- Consult called to on-call OB/GYN; medical record reviewed by on-call OB/GYN who advises that patient symptoms are normal following a miscarriage.  Discussed hemoglobin dropped from 13-10, advised that this is expected. OB/GYN recommendations were to perform transvaginal ultrasound to evaluate for retained products of conception and to reconsult them if there is any abnormalities.  Also advised that Cytotec to help with bleeding and p.o. iron supplementation for anemia.  They advised the patient follow-up with her appointment on Tuesday. ----------------------------------------- Transvaginal ultrasound ordered, patient updated on care plan and is agreeable.  She is ambulatory on emergency cart with her mother and is in no acute distress.  She denies pain and states that she is feeling well at this time. ----------------------------------------- Care handoff given to Wyn Quaker, PA-C at shift change.  Plan of care at shift change is to await result of transvaginal ultrasound, reconsult OB/GYN if necessary and disposition appropriately.   Note: Portions of this report may have been transcribed using voice recognition software. Every effort was made to ensure accuracy; however, inadvertent computerized transcription errors may still be present. Final Clinical Impressions(s) / ED Diagnoses   Final diagnoses:  Retained products of conception after miscarriage  Vaginal bleeding  Anemia associated with acute blood loss    ED Discharge Orders         Ordered    misoprostol (CYTOTEC) 200 MCG tablet  Status:  Discontinued     11/23/18 1939    HYDROcodone-acetaminophen (NORCO/VICODIN) 5-325 MG tablet  Every 6 hours PRN     11/23/18 1944     ferrous sulfate 325 (65 FE) MG tablet  Daily,   Status:  Discontinued     11/23/18 1944           Deliah Boston, PA-C 11/23/18 1550    Deliah Boston, PA-C 11/27/18 1345    Charlesetta Shanks, MD 12/02/18 1054

## 2018-11-23 NOTE — Discharge Instructions (Addendum)
With the cytotec please place the pills between your gyms and cheek for 30 minutes.    I have given you a prescription for iron.  This can turn your bowel movements black.  This will help with your low blood levels.

## 2018-11-23 NOTE — ED Triage Notes (Signed)
Pt having miscarriage.  Was given medication to induce evacuation of sack.  Pt having increased bleeding and passing large clots.  Was not able to follow up with provider.  Is having abdominal pain.  No fever.

## 2018-11-23 NOTE — ED Provider Notes (Signed)
I assumed care of patient at shift change from [facility], per his HPI: "Judy James is a 33 y.o. female presenting today for increased vaginal bleeding.  Patient diagnosed on 11/13/2026 with miscarriage, she was approximate [redacted] weeks pregnant at the time, she was given Cytotec and follow-up instructions.  Patient states that the next day she passed what she believed to be a fetus and gestational sac.  Patient reports that she has had continued heavy vaginal bleeding since that time and became concerned which is why she presents to the ER today.  She states that she attempted to follow-up with her OB/GYN however was unable to.  Additionally patient states she has been feeling more fatigued over the past few days and attributes this to her heavy vaginal bleeding.  Patient describes a mild lower abdominal cramping sensation since her miscarriage began however denies new or worsening pain.  She denies fever, dysuria, hematuria, nausea/vomiting, upper abdominal pain, chest pain or shortness of breath."  Physical Exam  BP (!) 110/58 (BP Location: Left Arm)   Pulse 71   Temp 99.3 F (37.4 C) (Oral)   Resp 18   SpO2 100%   Physical Exam Vitals signs and nursing note reviewed.  Constitutional:      General: She is not in acute distress.    Appearance: She is well-developed. She is not diaphoretic.  HENT:     Head: Normocephalic and atraumatic.  Eyes:     General: No scleral icterus.       Right eye: No discharge.        Left eye: No discharge.     Conjunctiva/sclera: Conjunctivae normal.  Neck:     Musculoskeletal: Normal range of motion.  Cardiovascular:     Rate and Rhythm: Normal rate and regular rhythm.  Pulmonary:     Effort: Pulmonary effort is normal. No respiratory distress.     Breath sounds: No stridor.  Abdominal:     General: There is no distension.  Skin:    General: Skin is warm and dry.  Neurological:     Mental Status: She is alert.     Motor: No  abnormal muscle tone.  Psychiatric:        Behavior: Behavior normal.     ED Course/Procedures   Clinical Course as of Nov 23 2041  Sat Nov 23, 2018  1410 Pelvic Exam Chaperoned by Chong Sicilian RN.   [BM]  8416 On call OBGYN: [place]; Cytotec, Iron   [BM]  1840 Spoke with on call OB/GYN recommended 868mcg cytotecBuccally- stay for 30 minutes. Ibuprofen, heating pad, few days of narc if needed.    [EH]    Clinical Course User Index [BM] Deliah Boston, PA-C [EH] Lorin Glass, PA-C    Procedures   US Ob Transvaginal  Result Date: 11/23/2018 CLINICAL DATA:  Miscarriage 11/15/2018, now with heavy bleeding EXAM: TRANSVAGINAL OB ULTRASOUND TECHNIQUE: Transvaginal ultrasound was performed for complete evaluation of the gestation as well as the maternal uterus, adnexal regions, and pelvic cul-de-sac. COMPARISON:  11/13/2018, CT 12/14/2011 FINDINGS: Intrauterine gestational sac: None Yolk sac:  Visualized. Embryo:  Visualized. Maternal uterus/adnexae: Heterogenous thickening of the endometrium, measuring up to 1.9 cm with vascularity. Heterogenous material within the lower uterine segment and cervix, avascular, possibly blood clot. Right ovary measures 3 x 1.9 by 3.8 cm with a volume of 11.4 mL. Echogenic mass measuring 3.9 x 2.9 x 3.8 cm. The left ovary measures 2.5 x 2.6 by 2.4 cm with a  volume of 8.2 mL. Echogenic mass within the left ovary measuring 1.3 x 1.8 x 1.4 cm. Trace free fluid. IMPRESSION: 1. Heterogenous thickening of the endometrium up to 1.9 cm with vascularity. Findings concerning for retained products of conception in the correct clinical setting. Additional heterogenous hypoechoic avascular material within the lower uterine segment and cervix, possible blood clot. 2. 3.9 cm complex right ovarian mass and 1.8 cm echogenic left ovarian mass, possible dermoids. Consider surgical consultation. Further evaluation with nonemergent pelvic MRI could also be considered. Electronically  Signed   By: Donavan Foil M.D.   On: 11/23/2018 16:51   Labs Reviewed  CBC WITH DIFFERENTIAL/PLATELET - Abnormal; Notable for the following components:      Result Value   WBC 11.6 (*)    RBC 3.46 (*)    Hemoglobin 10.5 (*)    HCT 32.2 (*)    Neutro Abs 8.9 (*)    All other components within normal limits  BASIC METABOLIC PANEL - Abnormal; Notable for the following components:   Glucose, Bld 147 (*)    BUN <5 (*)    All other components within normal limits  HCG, QUANTITATIVE, PREGNANCY - Abnormal; Notable for the following components:   hCG, Beta Chain, Quant, S 6,087 (*)    All other components within normal limits  CBC - Abnormal; Notable for the following components:   WBC 11.3 (*)    RBC 3.21 (*)    Hemoglobin 9.8 (*)    HCT 29.8 (*)    All other components within normal limits    Orthostatic VS for the past 24 hrs:  BP- Lying Pulse- Lying BP- Sitting Pulse- Sitting BP- Standing at 0 minutes Pulse- Standing at 0 minutes  11/23/18 1817 104/58 74 106/61 78 118/59 86      MDM   Plan: cytotec and iron, follow up on [place] and contact OB/GYN as needed.  She has a an office appointment on Tuesday.  Ultrasound results reviewed consistent with retained products of conception, and possible dermoids on bilateral ovaries.  I spoke with on-call OB/GYN who still recommend Cytotec, iron, and outpatient follow-up.  Patient's blood pressure did trend down while in the department.  She was given a second 1 L fluid bolus.  Orthostatics were checked and patient is not orthostatic.  Her blood pressure elevated to 459 systolic after second fluid bolus.  Her hemoglobin was rechecked, dropped from 10.5-9.8.  I spoke with on call OB/GYN to inform her of this along with systolic blood pressure changes, she still recommended Cytotec, p.o. iron supplementation, discharge home, and she has outpatient follow-up already arranged.  PMP was queried for the patient, she is given short prescription of  narcotic pain medicine in addition to Cytotec and iron prescriptions.  Return precautions were discussed with patient who states their understanding.  At the time of discharge patient denied any unaddressed complaints or concerns.  Patient is agreeable for discharge home.   Discharge    Ollen Gross 11/23/18 2052    Lacretia Leigh, MD 11/24/18 [id number]

## 2018-11-26 ENCOUNTER — Encounter (HOSPITAL_COMMUNITY)
Admission: AD | Disposition: A | Discharge status: Home / Self Care | Payer: Self-pay | Source: Ambulatory Visit | Attending: Obstetrics and Gynecology

## 2018-11-26 ENCOUNTER — Inpatient Hospital Stay (HOSPITAL_COMMUNITY): Payer: BLUE CROSS/BLUE SHIELD | Admitting: Certified Registered Nurse Anesthetist

## 2018-11-26 ENCOUNTER — Ambulatory Visit (HOSPITAL_COMMUNITY)
Admission: AD | Admit: 2018-11-26 | Discharge: 2018-11-26 | Disposition: A | Discharge status: Home / Self Care | Payer: BLUE CROSS/BLUE SHIELD | Source: Ambulatory Visit | Attending: Obstetrics and Gynecology | Admitting: Obstetrics and Gynecology

## 2018-11-26 ENCOUNTER — Encounter: Payer: Self-pay | Admitting: Obstetrics & Gynecology

## 2018-11-26 ENCOUNTER — Encounter (HOSPITAL_COMMUNITY): Payer: Self-pay

## 2018-11-26 ENCOUNTER — Ambulatory Visit (INDEPENDENT_AMBULATORY_CARE_PROVIDER_SITE_OTHER): Payer: BLUE CROSS/BLUE SHIELD | Admitting: Obstetrics & Gynecology

## 2018-11-26 DIAGNOSIS — O034 Incomplete spontaneous abortion without complication: Secondary | ICD-10-CM | POA: Diagnosis not present

## 2018-11-26 DIAGNOSIS — O021 Missed abortion: Secondary | ICD-10-CM | POA: Insufficient documentation

## 2018-11-26 DIAGNOSIS — D649 Anemia, unspecified: Secondary | ICD-10-CM | POA: Diagnosis not present

## 2018-11-26 DIAGNOSIS — Z79899 Other long term (current) drug therapy: Secondary | ICD-10-CM | POA: Diagnosis not present

## 2018-11-26 DIAGNOSIS — Z880 Allergy status to penicillin: Secondary | ICD-10-CM | POA: Insufficient documentation

## 2018-11-26 DIAGNOSIS — Z9889 Other specified postprocedural states: Secondary | ICD-10-CM

## 2018-11-26 HISTORY — PX: DILATION AND EVACUATION: SHX1459

## 2018-11-26 LAB — TYPE AND SCREEN
ABO/RH(D): O POS
Antibody Screen: NEGATIVE

## 2018-11-26 LAB — CBC
HCT: 26.4 % — ABNORMAL LOW (ref 36.0–46.0)
Hemoglobin: 9 g/dL — ABNORMAL LOW (ref 12.0–15.0)
MCH: 30.2 pg (ref 26.0–34.0)
MCHC: 34.1 g/dL (ref 30.0–36.0)
MCV: 88.6 fL (ref 80.0–100.0)
PLATELETS: 259 10*3/uL (ref 150–400)
RBC: 2.98 MIL/uL — ABNORMAL LOW (ref 3.87–5.11)
RDW: 12.8 % (ref 11.5–15.5)
WBC: 11.7 10*3/uL — ABNORMAL HIGH (ref 4.0–10.5)
nRBC: 0 % (ref 0.0–0.2)

## 2018-11-26 LAB — ABO/RH: ABO/RH(D): O POS

## 2018-11-26 SURGERY — DILATION AND EVACUATION, UTERUS
Anesthesia: Spinal | Site: Vagina

## 2018-11-26 MED ORDER — SODIUM CHLORIDE 0.9 % IV SOLN
8.0000 mg | Freq: Once | INTRAVENOUS | Status: DC
  Filled 2018-11-26: qty 4

## 2018-11-26 MED ORDER — BUPIVACAINE IN DEXTROSE 0.75-8.25 % IT SOLN
INTRATHECAL | Status: DC | PRN
  Administered 2018-11-26: 1 mL via INTRATHECAL

## 2018-11-26 MED ORDER — LIDOCAINE HCL 1 % IJ SOLN
INTRAMUSCULAR | Status: DC | PRN
  Administered 2018-11-26: 20 mL

## 2018-11-26 MED ORDER — SOD CITRATE-CITRIC ACID 500-334 MG/5ML PO SOLN
30.0000 mL | Freq: Once | ORAL | Status: AC
  Administered 2018-11-26: 30 mL via ORAL
  Filled 2018-11-26: qty 15

## 2018-11-26 MED ORDER — ONDANSETRON HCL 4 MG/2ML IJ SOLN
INTRAMUSCULAR | Status: DC | PRN
  Administered 2018-11-26: 4 mg via INTRAVENOUS

## 2018-11-26 MED ORDER — OXYCODONE HCL 5 MG PO TABS: 5.0000 mg | ORAL_TABLET | Freq: Once | ORAL | Status: DC | PRN

## 2018-11-26 MED ORDER — PROPOFOL 10 MG/ML IV BOLUS
INTRAVENOUS | Status: AC
  Filled 2018-11-26: qty 20

## 2018-11-26 MED ORDER — DEXAMETHASONE SODIUM PHOSPHATE 10 MG/ML IJ SOLN
INTRAMUSCULAR | Status: DC | PRN
  Administered 2018-11-26: 4 mg via INTRAVENOUS

## 2018-11-26 MED ORDER — HYDROMORPHONE HCL 1 MG/ML IJ SOLN: 0.2500 mg | INTRAMUSCULAR | Status: DC | PRN

## 2018-11-26 MED ORDER — ONDANSETRON HCL 4 MG/2ML IJ SOLN: 4.0000 mg | Freq: Once | INTRAMUSCULAR | Status: DC | PRN

## 2018-11-26 MED ORDER — LACTATED RINGERS IV BOLUS
1000.0000 mL | Freq: Once | INTRAVENOUS | Status: AC
  Administered 2018-11-26: 1000 mL via INTRAVENOUS

## 2018-11-26 MED ORDER — MIDAZOLAM HCL 2 MG/2ML IJ SOLN
INTRAMUSCULAR | Status: AC
  Filled 2018-11-26: qty 2

## 2018-11-26 MED ORDER — DEXAMETHASONE SODIUM PHOSPHATE 4 MG/ML IJ SOLN
INTRAMUSCULAR | Status: AC
  Filled 2018-11-26: qty 1

## 2018-11-26 MED ORDER — MIDAZOLAM HCL 2 MG/2ML IJ SOLN
INTRAMUSCULAR | Status: DC | PRN
  Administered 2018-11-26: 2 mg via INTRAVENOUS

## 2018-11-26 MED ORDER — FENTANYL CITRATE (PF) 100 MCG/2ML IJ SOLN
INTRAMUSCULAR | Status: DC | PRN
  Administered 2018-11-26: 100 ug via INTRAVENOUS

## 2018-11-26 MED ORDER — KETOROLAC TROMETHAMINE 30 MG/ML IJ SOLN
INTRAMUSCULAR | Status: AC
  Filled 2018-11-26: qty 1

## 2018-11-26 MED ORDER — LIDOCAINE HCL (CARDIAC) PF 100 MG/5ML IV SOSY
PREFILLED_SYRINGE | INTRAVENOUS | Status: AC
  Filled 2018-11-26: qty 5

## 2018-11-26 MED ORDER — FERROUS SULFATE 325 (65 FE) MG PO TABS: 325.0000 mg | ORAL_TABLET | Freq: Two times a day (BID) | ORAL | 0 refills | Status: DC

## 2018-11-26 MED ORDER — KETOROLAC TROMETHAMINE 30 MG/ML IJ SOLN: 30.0000 mg | Freq: Once | INTRAMUSCULAR | Status: DC | PRN

## 2018-11-26 MED ORDER — HYDROMORPHONE HCL 1 MG/ML IJ SOLN
1.0000 mg | INTRAMUSCULAR | Status: DC | PRN
  Administered 2018-11-26: 1 mg via INTRAVENOUS
  Filled 2018-11-26: qty 1

## 2018-11-26 MED ORDER — FAMOTIDINE IN NACL 20-0.9 MG/50ML-% IV SOLN
20.0000 mg | Freq: Once | INTRAVENOUS | Status: AC
  Administered 2018-11-26: 20 mg via INTRAVENOUS
  Filled 2018-11-26: qty 50

## 2018-11-26 MED ORDER — OXYCODONE HCL 5 MG/5ML PO SOLN: 5.0000 mg | Freq: Once | ORAL | Status: DC | PRN

## 2018-11-26 MED ORDER — SODIUM CHLORIDE 0.9 % IV SOLN
100.0000 mg | Freq: Once | INTRAVENOUS | Status: AC
  Administered 2018-11-26: 100 mg via INTRAVENOUS
  Filled 2018-11-26: qty 100

## 2018-11-26 MED ORDER — ACETAMINOPHEN 160 MG/5ML PO SOLN: 325.0000 mg | ORAL | Status: DC | PRN

## 2018-11-26 MED ORDER — LACTATED RINGERS IV SOLN
INTRAVENOUS | Status: DC
  Administered 2018-11-26: 11:00:00 via INTRAVENOUS

## 2018-11-26 MED ORDER — IBUPROFEN 600 MG PO TABS: 600.0000 mg | ORAL_TABLET | Freq: Four times a day (QID) | ORAL | 3 refills | Status: DC | PRN

## 2018-11-26 MED ORDER — MEPERIDINE HCL 25 MG/ML IJ SOLN: 6.2500 mg | INTRAMUSCULAR | Status: DC | PRN

## 2018-11-26 MED ORDER — ONDANSETRON HCL 4 MG/2ML IJ SOLN
INTRAMUSCULAR | Status: AC
  Filled 2018-11-26: qty 2

## 2018-11-26 MED ORDER — ACETAMINOPHEN 325 MG PO TABS: 325.0000 mg | ORAL_TABLET | ORAL | Status: DC | PRN

## 2018-11-26 MED ORDER — DOCUSATE SODIUM 100 MG PO CAPS: 100.0000 mg | ORAL_CAPSULE | Freq: Two times a day (BID) | ORAL | 2 refills | Status: AC | PRN

## 2018-11-26 MED ORDER — FENTANYL CITRATE (PF) 100 MCG/2ML IJ SOLN
INTRAMUSCULAR | Status: AC
  Filled 2018-11-26: qty 2

## 2018-11-26 SURGICAL SUPPLY — 21 items
CATH ROBINSON RED A/P 16FR (CATHETERS) ×2 IMPLANT
DECANTER SPIKE VIAL GLASS SM (MISCELLANEOUS) ×3 IMPLANT
GLOVE BIOGEL PI IND STRL 7.0 (GLOVE) ×1 IMPLANT
GLOVE BIOGEL PI IND STRL 7.5 (GLOVE) ×1 IMPLANT
GLOVE BIOGEL PI INDICATOR 7.0 (GLOVE) ×2
GLOVE BIOGEL PI INDICATOR 7.5 (GLOVE) ×2
GLOVE NEODERM STER SZ 7 (GLOVE) ×3 IMPLANT
GOWN STRL REUS W/TWL LRG LVL3 (GOWN DISPOSABLE) ×3 IMPLANT
GOWN STRL REUS W/TWL XL LVL3 (GOWN DISPOSABLE) ×3 IMPLANT
HIBICLENS CHG 4% 4OZ BTL (MISCELLANEOUS) ×3 IMPLANT
KIT BERKELEY 1ST TRIMESTER 3/8 (MISCELLANEOUS) ×3 IMPLANT
NS IRRIG 1000ML POUR BTL (IV SOLUTION) ×3 IMPLANT
PACK VAGINAL MINOR WOMEN LF (CUSTOM PROCEDURE TRAY) ×3 IMPLANT
PAD OB MATERNITY 4.3X12.25 (PERSONAL CARE ITEMS) ×3 IMPLANT
PAD PREP 24X48 CUFFED NSTRL (MISCELLANEOUS) ×3 IMPLANT
SET BERKELEY SUCTION TUBING (SUCTIONS) ×3 IMPLANT
TOWEL OR 17X24 6PK STRL BLUE (TOWEL DISPOSABLE) ×6 IMPLANT
VACURETTE 10 RIGID CVD (CANNULA) IMPLANT
VACURETTE 7MM CVD STRL WRAP (CANNULA) IMPLANT
VACURETTE 8 RIGID CVD (CANNULA) ×2 IMPLANT
VACURETTE 9 RIGID CVD (CANNULA) IMPLANT

## 2018-11-26 NOTE — Transfer of Care (Signed)
Immediate Anesthesia Transfer of Care Note  Patient: Judy James  Procedure(s) Performed: DILATATION AND EVACUATION (N/A Vagina )  Patient Location: PACU  Anesthesia Type:Spinal  Level of Consciousness: sedated  Airway & Oxygen Therapy: Patient Spontanous Breathing and Patient connected to nasal cannula oxygen  Post-op Assessment: Report given to RN and Post -op Vital signs reviewed and stable  Post vital signs: Reviewed and stable  Last Vitals:  Vitals Value Taken Time  BP    Temp    Pulse    Resp    SpO2      Last Pain:  Vitals:   11/26/18 0930  TempSrc: Oral         Complications: No apparent anesthesia complications

## 2018-11-26 NOTE — MAU Note (Signed)
Judy James is a 33 y.o. here in MAU reporting: heavy vaginal bleeding since Monday. Was given cytotec 2 times for missed AB and reports she has not seen any tissue yet. Sent from the office for possible D&E. States she is changing a pad every 15-30 min and it is completely saturated  Onset of complaint: Monday  Pain score: 0/10

## 2018-11-26 NOTE — Patient Instructions (Signed)
Miscarriage A miscarriage is the loss of an unborn baby (fetus) before the 20th week of pregnancy. Most miscarriages happen during the first 3 months of pregnancy. Sometimes, a miscarriage can happen before a woman knows that she is pregnant. Having a miscarriage can be an emotional experience. If you have had a miscarriage, talk with your health care provider about any questions you may have about miscarrying, the grieving process, and your plans for future pregnancy. What are the causes? A miscarriage may be caused by:  Problems with the genes or chromosomes of the fetus. These problems make it impossible for the baby to develop normally. They are often the result of random errors that occur early in the development of the baby, and are not passed from parent to child (not inherited).  Infection of the cervix or uterus.  Conditions that affect hormone balance in the body.  Problems with the cervix, such as the cervix opening and thinning before pregnancy is at term (cervical insufficiency).  Problems with the uterus. These may include: ? A uterus with an abnormal shape. ? Fibroids in the uterus. ? Congenital abnormalities. These are problems that were present at birth.  Certain medical conditions.  Smoking, drinking alcohol, or using drugs.  Injury (trauma). In many cases, the cause of a miscarriage is not known. What are the signs or symptoms? Symptoms of this condition include:  Vaginal bleeding or spotting, with or without cramps or pain.  Pain or cramping in the abdomen or lower back.  Passing fluid, tissue, or blood clots from the vagina. How is this diagnosed? This condition may be diagnosed based on:  A physical exam.  Ultrasound.  Blood tests.  Urine tests. How is this treated? Treatment for a miscarriage is sometimes not necessary if you naturally pass all the tissue that was in your uterus. If necessary, this condition may be treated with:  Dilation and  curettage (D&C). This is a procedure in which the cervix is stretched open and the lining of the uterus (endometrium) is scraped. This is done only if tissue from the fetus or placenta remains in the body (incomplete miscarriage).  Medicines, such as: ? Antibiotic medicine, to treat infection. ? Medicine to help the body pass any remaining tissue. ? Medicine to reduce (contract) the size of the uterus. These medicines may be given if you have a lot of bleeding. If you have Rh negative blood and your baby was Rh positive, you will need a shot of a medicine called Rh immunoglobulinto protect your future babies from Rh blood problems. "Rh-negative" and "Rh-positive" refer to whether or not the blood has a specific protein found on the surface of red blood cells (Rh factor). Follow these instructions at home: Medicines   Take over-the-counter and prescription medicines only as told by your health care provider.  If you were prescribed antibiotic medicine, take it as told by your health care provider. Do not stop taking the antibiotic even if you start to feel better.  Do not take NSAIDs, such as aspirin and ibuprofen, unless they are approved by your health care provider. These medicines can cause bleeding. Activity  Rest as directed. Ask your health care provider what activities are safe for you.  Have someone help with home and family responsibilities during this time. General instructions  Keep track of the number of sanitary pads you use each day and how soaked (saturated) they are. Write down this information.  Monitor the amount of tissue or blood clots that   help with home and family responsibilities during this time.  General instructions  · Keep track of the number of sanitary pads you use each day and how soaked (saturated) they are. Write down this information.  · Monitor the amount of tissue or blood clots that you pass from your vagina. Save any large amounts of tissue for your health care provider to examine.  · Do not use tampons, douche, or have sex until your health care provider approves.  · To help you and your partner with the process of grieving, talk with your health care provider or seek counseling.  · When you are ready, meet with  your health care provider to discuss any important steps you should take for your health. Also, discuss steps you should take to have a healthy pregnancy in the future.  · Keep all follow-up visits as told by your health care provider. This is important.  Where to find more information  · The American Congress of Obstetricians and Gynecologists: www.acog.org  · U.S. Department of Health and Human Services Office of Women’s Health: www.womenshealth.gov  Contact a health care provider if:  · You have a fever or chills.  · You have a foul smelling vaginal discharge.  · You have more bleeding instead of less.  Get help right away if:  · You have severe cramps or pain in your back or abdomen.  · You pass blood clots or tissue from your vagina that is walnut-sized or larger.  · You soak more than 1 regular sanitary pad in an hour.  · You become light-headed or weak.  · You pass out.  · You have feelings of sadness that take over your thoughts, or you have thoughts of hurting yourself.  Summary  · Most miscarriages happen in the first 3 months of pregnancy. Sometimes miscarriage happens before a woman even knows that she is pregnant.  · Follow your health care provider's instruction for home care. Keep all follow-up appointments.  · To help you and your partner with the process of grieving, talk with your health care provider or seek counseling.  This information is not intended to replace advice given to you by your health care provider. Make sure you discuss any questions you have with your health care provider.  Document Released: 04/04/2001 Document Revised: 11/14/2016 Document Reviewed: 11/14/2016  Elsevier Interactive Patient Education © 2019 [employer]

## 2018-11-26 NOTE — MAU Provider Note (Addendum)
History     CSN: [id number]  Arrival date and time: 11/26/18 0901   First Provider Initiated Contact with Patient 11/26/18 0932      Chief Complaint  Patient presents with  . Vaginal Bleeding   G2P1001 with known MAB sent from office for increased VB. She is s/p Cytotec x2. Reports increased VB since second dose yesterday. Soaking pads q15-60 minutes. Has passed some clots.    OB History    Gravida  2   Para  1   Term  1   Preterm      AB      Living  1     SAB      TAB      Ectopic      Multiple      Live Births  1           Past Medical History:  Diagnosis Date  . Arthritis    knee  . Knee pain   . Migraines     Past Surgical History:  Procedure Laterality Date  . CESAREAN SECTION    . CESAREAN SECTION  2008  . WISDOM TOOTH EXTRACTION      Family History  Problem Relation Age of Onset  . Diabetes Mother   . Diabetes Maternal Aunt   . Hearing loss Maternal Aunt   . Heart disease Maternal Aunt   . Hypertension Maternal Aunt   . Stroke Maternal Aunt   . Alcohol abuse Maternal Uncle   . Diabetes Maternal Uncle   . Drug abuse Maternal Uncle   . Hyperlipidemia Maternal Uncle   . Hypertension Maternal Uncle   . Heart disease Maternal Uncle   . Cancer Maternal Grandmother   . COPD Maternal Grandfather     Social History   Tobacco Use  . Smoking status: Never Smoker  . Smokeless tobacco: Never Used  Substance Use Topics  . Alcohol use: Yes    Alcohol/week: 0.0 standard drinks    Comment: 0-1  . Drug use: No    Allergies:  Allergies  Allergen Reactions  . Penicillin G Hives    Medications Prior to Admission  Medication Sig Dispense Refill Last Dose  . ferrous sulfate 325 (65 FE) MG tablet Take 1 tablet (325 mg total) by mouth daily. 30 tablet 0 11/26/2018 at Unknown time  . HYDROcodone-acetaminophen (NORCO/VICODIN) 5-325 MG tablet Take 1 tablet by mouth every 6 (six) hours as needed for severe pain. 10 tablet 0 11/26/2018 at  Unknown time  . misoprostol (CYTOTEC) 200 MCG tablet Place four tablets in between your gums and cheeks (two tablets on each side) 4 tablet 1 11/25/2018 at Unknown time  . Multiple Vitamin (MULTIVITAMIN) capsule Take 1 capsule by mouth daily.   11/25/2018 at Unknown time  . ondansetron (ZOFRAN ODT) 4 MG disintegrating tablet Take 1 tablet (4 mg total) by mouth every 8 (eight) hours as needed for nausea or vomiting. (Patient not taking: Reported on 11/23/2018) 15 tablet 0 Not Taking    Review of Systems  Constitutional: Negative for fever.  Gastrointestinal: Negative for abdominal pain.  Genitourinary: Positive for vaginal bleeding.   Physical Exam   Blood pressure (!) 102/45, pulse 68, temperature 98.9 F (37.2 C), temperature source Oral, resp. rate 18, height 5' (1.524 m), weight 80.6 kg, SpO2 95 %, unknown if currently breastfeeding.  Physical Exam  Nursing note and vitals reviewed. Constitutional: She is oriented to person, place, and time. She appears well-developed and well-nourished. No distress.  HENT:  Head: Normocephalic and atraumatic.  Neck: Normal range of motion.  Cardiovascular: Normal rate.  Respiratory: Effort normal. No respiratory distress.  GI: Soft. She exhibits no distension. There is no abdominal tenderness.  Genitourinary:    Genitourinary Comments: External: no lesions or erythema Vagina: rugated, pink, moist, moderate bloody discharge, POCs protruding from os Cervix 1 cm    Musculoskeletal: Normal range of motion.  Neurological: She is alert and oriented to person, place, and time.  Skin: Skin is warm and dry.  Psychiatric: She has a normal mood and affect.   Results for orders placed or performed during the hospital encounter of 11/26/18 (from the past 24 hour(s))  CBC     Status: Abnormal   Collection Time: 11/26/18  9:25 AM  Result Value Ref Range   WBC 11.7 (H) 4.0 - 10.5 K/uL   RBC 2.98 (L) 3.87 - 5.11 MIL/uL   Hemoglobin 9.0 (L) 12.0 - 15.0 g/dL    HCT 26.4 (L) 36.0 - 46.0 %   MCV 88.6 80.0 - 100.0 fL   MCH 30.2 26.0 - 34.0 pg   MCHC 34.1 30.0 - 36.0 g/dL   RDW 12.8 11.5 - 15.5 %   Platelets 259 150 - 400 K/uL   nRBC 0.0 0.0 - 0.2 %  Type and screen     Status: None   Collection Time: 11/26/18  9:25 AM  Result Value Ref Range   ABO/RH(D) O POS    Antibody Screen NEG    Sample Expiration      11/29/2018 Performed at [facility], [address] [place], [place] [address]    MAU Course  Procedures Orders Placed This Encounter  Procedures  . CBC    Standing Status:   Standing    Number of Occurrences:   1  . Place and maintain sequential compression device    Standing Status:   Standing    Number of Occurrences:   1  . Type and screen    Standing Status:   Standing    Number of Occurrences:   1  . ABO/Rh    Standing Status:   Standing    Number of Occurrences:   1   Meds ordered this encounter  Medications  . HYDROmorphone (DILAUDID) injection 1 mg  . lactated ringers bolus 1,000 mL  . doxycycline (VIBRAMYCIN) 100 mg in sodium chloride 0.9 % 250 mL IVPB    Order Specific Question:   Antibiotic Indication:    Answer:   Other Indication (list below)  . lactated ringers infusion  . sodium citrate-citric acid (ORACIT) solution 30 mL  . famotidine (PEPCID) IVPB 20 mg premix  . ondansetron (ZOFRAN) 8 mg in sodium chloride 0.9 % 50 mL IVPB   MDM Labs ordered and reviewed. POCs seen at os, attempt of removal with ring forcep but pt intolerant, will give analgesic and try again, pt agrees.  Second attempt to remove POCs from os unsuccessful, able to visualize but tissue breaking apart. Consult with Dr. Ilda Basset, plan to examine and attempt removal.  Attempt unsuccessful by Dr. Ilda Basset, plan for [facility] today.   Assessment and Plan  Retained POCs Rh pos Admit to OR Mngt per Dr. Barrie Lyme, CNM 11/26/2018, 12:29 PM

## 2018-11-26 NOTE — Op Note (Signed)
Operative Note   11/26/2018  PRE-OP DIAGNOSIS: Failed medical management of missed abortion at 8 weeks. Anemia. Vaginal bleeding   POST-OP DIAGNOSIS: Same  SURGEON: Surgeon(s) and Role:    Aletha Halim, MD - Primary  ASSISTANT: None  PROCEDURE:  Suction dilation and curettage  ANESTHESIA: Spinal and paracervical block  ESTIMATED BLOOD LOSS: 106mL  DRAINS: None  TOTAL IV FLUIDS: per anesthesia note  SPECIMENS: products of conception to pathology  VTE PROPHYLAXIS: SCDs to the bilateral lower extremities  ANTIBIOTICS: Doxycycline 100mg  IV x 1 pre op  COMPLICATIONS: none  DISPOSITION: PACU - hemodynamically stable.  CONDITION: stable  BLOOD TYPE: O POS. Rhogam given:not applicable  FINDINGS: Exam under anesthesia revealed 8 week sized uterus with no masses and bilateral adnexa without masses or fullness. Necrotic appearing products of conception were seen, with gritty texture in all four quadrants.  PROCEDURE IN DETAIL:  After informed consent was obtained, the patient was taken to the operating room where anesthesia was obtained without difficulty. The patient was positioned in the dorsal lithotomy position in [place]. The patient was examined under anesthesia, with the above noted findings.  The bi-valved speculum was placed inside the patient's vagina, and the the anterior lip of the cervix was seen and grasped with the tenaculum.  A paracervical block was achieved with 9mL of 1% lidocaine and then the cervix was already dilated to a  #8 cannula.  The suction was then calibrated to 76mmHg and connected to the number 8 cannula, which was then introduced with the above noted findings. A gentle curettage was done at the end and yield no products of conception.   The suction was then done one more time to remove any remaining curettage material.   Excellent hemostasis was noted, and all instruments were removed, with excellent hemostasis noted throughout.  She was then  taken out of dorsal lithotomy. The patient tolerated the procedure well.  Sponge, lap and instrument counts were correct x2.  The patient was taken to recovery room in excellent condition.  Durene Romans MD Attending Center for [employer])

## 2018-11-26 NOTE — Anesthesia Procedure Notes (Signed)
Spinal  Patient location during procedure: OR Start time: 11/26/2018 12:24 PM End time: 11/26/2018 12:26 PM Staffing Anesthesiologist: Lyn Hollingshead, MD Performed: anesthesiologist  Preanesthetic Checklist Completed: patient identified, site marked, surgical consent, pre-op evaluation, timeout performed, IV checked, risks and benefits discussed and monitors and equipment checked Spinal Block Patient position: sitting Prep: site prepped and draped and DuraPrep Patient monitoring: continuous pulse ox and blood pressure Approach: midline Location: L3-4 Injection technique: single-shot Needle Needle type: Pencan  Needle gauge: 24 G Needle length: 10 cm Needle insertion depth: 7 cm Assessment Sensory level: T10

## 2018-11-26 NOTE — Progress Notes (Signed)
Patient ID: Judy James, female   DOB: 1985/12/01, 33 y.o.   MRN: [id number]  Chief Complaint  Patient presents with  . Follow-up    HPI Judy James is a 33 y.o. female.  G2P1001 Unknown No LMP recorded. On 1/22 she had an US showing failed pregnancy with 8+ wk MSD no pole. S/P 2 doses of cytotec with heavy bleeding pain and no tissue passage, feels lightheaded. HPI  Past Medical History:  Diagnosis Date  . Arthritis    knee  . Knee pain   . Migraines     Past Surgical History:  Procedure Laterality Date  . CESAREAN SECTION    . CESAREAN SECTION  2008  . WISDOM TOOTH EXTRACTION      Family History  Problem Relation Age of Onset  . Diabetes Mother   . Diabetes Maternal Aunt   . Hearing loss Maternal Aunt   . Heart disease Maternal Aunt   . Hypertension Maternal Aunt   . Stroke Maternal Aunt   . Alcohol abuse Maternal Uncle   . Diabetes Maternal Uncle   . Drug abuse Maternal Uncle   . Hyperlipidemia Maternal Uncle   . Hypertension Maternal Uncle   . Heart disease Maternal Uncle   . Cancer Maternal Grandmother   . COPD Maternal Grandfather     Social History Social History   Tobacco Use  . Smoking status: Never Smoker  . Smokeless tobacco: Never Used  Substance Use Topics  . Alcohol use: Yes    Alcohol/week: 0.0 standard drinks    Comment: 0-1  . Drug use: No    Allergies  Allergen Reactions  . Penicillin G Hives    Current Outpatient Medications  Medication Sig Dispense Refill  . ferrous sulfate 325 (65 FE) MG tablet Take 1 tablet (325 mg total) by mouth daily. 30 tablet 0  . HYDROcodone-acetaminophen (NORCO/VICODIN) 5-325 MG tablet Take 1 tablet by mouth every 6 (six) hours as needed for severe pain. 10 tablet 0  . misoprostol (CYTOTEC) 200 MCG tablet Place four tablets in between your gums and cheeks (two tablets on each side) 4 tablet 1  . Multiple Vitamin (MULTIVITAMIN) capsule Take 1 capsule by mouth daily.    . ondansetron (ZOFRAN ODT) 4  MG disintegrating tablet Take 1 tablet (4 mg total) by mouth every 8 (eight) hours as needed for nausea or vomiting. (Patient not taking: Reported on 11/23/2018) 15 tablet 0   No current facility-administered medications for this visit.     Review of Systems Review of Systems  Constitutional: Positive for fatigue.  Genitourinary: Positive for pelvic pain and vaginal bleeding.  Neurological: Positive for dizziness.    Blood pressure 103/69, pulse (!) 116, weight 177 lb 14.4 oz (80.7 kg), unknown if currently breastfeeding.  Physical Exam Physical Exam Cardiovascular:     Rate and Rhythm: Tachycardia present.  Pulmonary:     Effort: Pulmonary effort is normal.  Genitourinary:    General: Normal vulva.     Comments: Blood and clots in vagina and no tissue, cx posterior not open 6-8 weeks  Neurological:     Mental Status: She is alert.  Psychiatric:     Comments: Crying at times     Data Reviewed CLINICAL DATA:  Empty gestational sac on bedside ultrasound  EXAM: OBSTETRIC <14 WK [place] AND TRANSVAGINAL OB US  TECHNIQUE: Both transabdominal and transvaginal ultrasound examinations were performed for complete evaluation of the gestation as well as the maternal uterus, adnexal regions, and  pelvic cul-de-sac. Transvaginal technique was performed to assess early pregnancy.  COMPARISON:  None  FINDINGS: Intrauterine gestational sac: Present, single  Yolk sac:  Non identified  Embryo:  None identified  Cardiac Activity: N/A  Heart Rate: N/A  bpm  MSD: 34.1 mm   8 w   4 d  CRL:    mm    w    d                  [place] EDC:  Subchorionic hemorrhage:  None identified  Maternal uterus/adnexae:  Mild sac irregularity  RIGHT ovary normal size and morphology 3.1 x 1.6 x 2.1 cm.  LEFT ovary measures 3.2 x 1.9 x 2.7 cm and contains a bright hyperechoic nodule without shadowing 1.7 cm diameter likely a small dermoid tumor; this was only imaged/measured in a single  plane.  No free pelvic fluid or other adnexal masses.  IMPRESSION: Gestational sac within uterus lacking a fetal pole and a yolk sac.  Based on sac size and the absence of an embryo, findings meet definitive criteria for failed pregnancy. This follows SRU consensus guidelines: Diagnostic Criteria for Nonviable Pregnancy Early in the First Trimester. Alison Stalling J Med [phone].  1.7 cm diameter hyperechoic nodule within RIGHT ovary likely representing a dermoid tumor, though this was not imaged in a second plane.   Electronically Signed   By: Lavonia Dana M.D.  Assessment    Incomplete abortion with bleeding, sx of anemia    Plan    To MAU for evaluation for possible D&E. Pt to remain NPO       Emeterio Reeve 11/26/2018, 8:52 AM

## 2018-11-26 NOTE — Discharge Instructions (Addendum)
Post Anesthesia Home Care Instructions  Activity: Get plenty of rest for the remainder of the day. A responsible individual must stay with you for 24 hours following the procedure.  For the next 24 hours, DO NOT: -Drive a car -Paediatric nurse -Drink alcoholic beverages -Take any medication unless instructed by your physician -Make any legal decisions or sign important papers.  Meals: Start with liquid foods such as gelatin or soup. Progress to regular foods as tolerated. Avoid greasy, spicy, heavy foods. If nausea and/or vomiting occur, drink only clear liquids until the nausea and/or vomiting subsides. Call your physician if vomiting continues.  Special Instructions/Symptoms: Your throat may feel dry or sore from the anesthesia or the breathing tube placed in your throat during surgery. If this causes discomfort, gargle with warm salt water. The discomfort should disappear within 24 hours.  If you had a scopolamine patch placed behind your ear for the management of post- operative nausea and/or vomiting:  1. The medication in the patch is effective for 72 hours, after which it should be removed.  Wrap patch in a tissue and discard in the trash. Wash hands thoroughly with soap and water. 2. You may remove the patch earlier than 72 hours if you experience unpleasant side effects which may include dry mouth, dizziness or visual disturbances. 3. Avoid touching the patch. Wash your hands with soap and water after contact with the patch.      We will discuss your surgery once again in detail at your post-op visit in two to four weeks. If you havent already done so, please call to make your appointment as soon as possible.  Dilation and Curettage or Vacuum Curettage, Care After These instructions give you information on caring for yourself after your procedure. Your doctor may also give you more specific instructions. Call your doctor if you have any problems or questions after your  procedure. HOME CARE  Do not drive for 24 hours.  Wait 1 week before doing any activities that wear you out.  Do not stand for a long time.  Limit stair climbing to once or twice a day.  Rest often.  Continue with your usual diet.  Drink enough fluids to keep your pee (urine) clear or pale yellow.  If you have a hard time pooping (constipation), you may:  Take a medicine to help you go poop (laxative) as told by your doctor.  Eat more fruit and bran.  Drink more fluids.  Take showers, not baths, for as long as told by your doctor.  Do not swim or use a hot tub until your doctor says it is okay.  Have someone with you for 1day after the procedure.  Do not douche, use tampons, or have sex (intercourse) until seen by your doctor  Only take medicines as told by your doctor. Do not take aspirin. It can cause bleeding.  Keep all doctor visits. GET HELP IF:  You have cramps or pain not helped by medicine.  You have new pain in the belly (abdomen).  You have a bad smelling fluid coming from your vagina.  You have a rash.  You have problems with any medicine. GET HELP RIGHT AWAY IF:   You start to bleed more than a regular period.  You have a fever.  You have chest pain.  You have trouble breathing.  You feel dizzy or feel like passing out (fainting).  You pass out.  You have pain in the tops of your shoulders.  You  have vaginal bleeding with or without clumps of blood (blood clots). MAKE SURE YOU:  Understand these instructions.  Will watch your condition.  Will get help right away if you are not doing well or get worse. Document Released: 07/18/2008 Document Revised: 10/14/2013 Document Reviewed: 05/08/2013 [facility] Patient Information 2015 [place], [place]. This information is not intended to replace advice given to you by your health care provider. Make sure you discuss any questions you have with your health care provider.

## 2018-11-26 NOTE — H&P (Signed)
Obstetrics & Gynecology H&P   Date of Admission: 11/26/2018   Requesting Provider: MAU  Primary OBGYN: [facility] Primary Care Provider: Lucille James  Reason for Admission: MAU for VB after missed AB and medical management.   History of Present Illness: [name] is a 33 y.o. G2P1011 (No LMP recorded.), with the above CC. PMHx is significant for nothing.  Dx with 8/6wk missed AB on 1/22 and given cytotec. She went to the ED on 2/1 and u/s still showed retained POCs. ED d/w OB on call and they recommended rpt cytotec.   Patient has been having bleeding since. No fevers.   ROS: A 12-point review of systems was performed and negative, except as stated in the above HPI.  OBGYN History: As per HPI. OB History  Gravida Para Term Preterm AB Living  2 1 1     1   SAB TAB Ectopic Multiple Live Births          1    # Outcome Date GA Lbr Len/2nd Weight Sex Delivery Anes PTL Lv  2 Gravida           1 Term 01/17/07 [redacted]w[redacted]d   F CS-LTranv   LIV     Complications: Fetal Intolerance      Past Medical History: Past Medical History:  Diagnosis Date  . Arthritis    knee  . Knee pain   . Migraines     Past Surgical History: Past Surgical History:  Procedure Laterality Date  . CESAREAN SECTION    . CESAREAN SECTION  2008  . WISDOM TOOTH EXTRACTION      Family History:  Family History  Problem Relation Age of Onset  . Diabetes Mother   . Diabetes Maternal Aunt   . Hearing loss Maternal Aunt   . Heart disease Maternal Aunt   . Hypertension Maternal Aunt   . Stroke Maternal Aunt   . Alcohol abuse Maternal Uncle   . Diabetes Maternal Uncle   . Drug abuse Maternal Uncle   . Hyperlipidemia Maternal Uncle   . Hypertension Maternal Uncle   . Heart disease Maternal Uncle   . Cancer Maternal Grandmother   . COPD Maternal Grandfather    Social History:  Social History   Socioeconomic History  . Marital status: Single    Spouse name: Not on file  . Number of children: Not on file  .  Years of education: Not on file  . Highest education level: Not on file  Occupational History  . Not on file  Social Needs  . Financial resource strain: Not on file  . Food insecurity:    Worry: Not on file    Inability: Not on file  . Transportation needs:    Medical: Not on file    Non-medical: Not on file  Tobacco Use  . Smoking status: Never Smoker  . Smokeless tobacco: Never Used  Substance and Sexual Activity  . Alcohol use: Yes    Alcohol/week: 0.0 standard drinks    Comment: 0-1  . Drug use: No  . Sexual activity: Yes  Lifestyle  . Physical activity:    Days per week: Not on file    Minutes per session: Not on file  . Stress: Not on file  Relationships  . Social connections:    Talks on phone: Not on file    Gets together: Not on file    Attends religious service: Not on file    Active member of club or organization: Not  on file    Attends meetings of clubs or organizations: Not on file    Relationship status: Not on file  . Intimate partner violence:    Fear of current or ex partner: Not on file    Emotionally abused: Not on file    Physically abused: Not on file    Forced sexual activity: Not on file  Other Topics Concern  . Not on file  Social History Narrative  . Not on file     Allergy: Allergies  Allergen Reactions  . Penicillin G Hives    Current Outpatient Medications: Medications Prior to Admission  Medication Sig Dispense Refill Last Dose  . ferrous sulfate 325 (65 FE) MG tablet Take 1 tablet (325 mg total) by mouth daily. 30 tablet 0 Taking  . HYDROcodone-acetaminophen (NORCO/VICODIN) 5-325 MG tablet Take 1 tablet by mouth every 6 (six) hours as needed for severe pain. 10 tablet 0 Taking  . misoprostol (CYTOTEC) 200 MCG tablet Place four tablets in between your gums and cheeks (two tablets on each side) 4 tablet 1 Taking  . Multiple Vitamin (MULTIVITAMIN) capsule Take 1 capsule by mouth daily.   Not Taking  . ondansetron (ZOFRAN ODT) 4 MG  disintegrating tablet Take 1 tablet (4 mg total) by mouth every 8 (eight) hours as needed for nausea or vomiting. (Patient not taking: Reported on 11/23/2018) 15 tablet 0 Not Taking     Hospital Medications: Current Facility-Administered Medications  Medication Dose Route Frequency Provider Last Rate Last Dose  . HYDROmorphone (DILAUDID) injection 1 mg  1 mg Intravenous Q2H PRN Julianne Handler, CNM   1 mg at 11/26/18 1003     Physical Exam:  Current Vital Signs 24h Vital Sign Ranges  T 98.9 F (37.2 C) Temp  Avg: 98.9 F (37.2 C)  Min: 98.9 F (37.2 C)  Max: 98.9 F (37.2 C)  BP (!) 102/45 BP  Min: 87/50  Max: 103/69  HR 68 Pulse  Avg: 87.3  Min: 68  Max: 116  RR 18 Resp  Avg: 18  Min: 18  Max: 18  SaO2 95 %   SpO2  Avg: 97.8 %  Min: 93 %  Max: 100 %       24 Hour I/O Current Shift I/O  Time Ins Outs No intake/output data recorded. No intake/output data recorded.   Body mass index is 34.69 kg/m. General appearance: Well nourished, well developed female in no acute distress.  Cardiovascular: S1, S2 normal, no murmur, rub or gallop, regular rate and rhythm Respiratory:  Clear to auscultation bilateral. Normal respiratory effort Abdomen: positive bowel sounds and no masses, hernias; diffusely non tender to palpation, non distended Neuro/Psych:  Normal mood and affect.  Skin:  Warm and dry.  Extremities: no clubbing, cyanosis, or edema.  Lymphatic:  No inguinal lymphadenopathy.   Pelvic exam: EGBUS normal Vault with approx 69mL of blood clot and 81mL of blood. Mild to moderate amount of bleeding. Some clots and ?tissue removed from the canal but still some in there.   Laboratory: O POS  Recent Labs  Lab 11/23/18 1218 11/23/18 1720 11/26/18 0925  WBC 11.6* 11.3* 11.7*  HGB 10.5* 9.8* 9.0*  HCT 32.2* 29.8* 26.4*  PLT 243 238 259   Recent Labs  Lab 11/23/18 1218  NA 137  K 3.8  CL 104  CO2 25  BUN <5*  CREATININE 0.59  CALCIUM 8.9  GLUCOSE 147*   No  results for input(s): APTT, INR, PTT in the last  168 hours.  Invalid input(s): DRHAPTT Recent Labs  Lab 11/26/18 0925  ABORH O POS    Imaging:  Bedside u/s: non diagnostic  Assessment: [name] is a 33 y.o. G2P1001 (No LMP recorded.) with VB, anemia and likely rPOCs. Pt stable  Plan: D/w her re: recommendation for need for d&c and pt is amenable to this Last PO was some almonds at 0800.   Durene Romans MD Attending Center for [facility])

## 2018-11-26 NOTE — Anesthesia Postprocedure Evaluation (Signed)
Anesthesia Post Note  Patient: Judy James  Procedure(s) Performed: DILATATION AND EVACUATION (N/A Vagina )     Patient location during evaluation: PACU Anesthesia Type: Spinal Level of consciousness: awake Pain management: pain level controlled Vital Signs Assessment: post-procedure vital signs reviewed and stable Respiratory status: spontaneous breathing Cardiovascular status: stable Postop Assessment: no headache, no backache, spinal receding and no apparent nausea or vomiting    Last Vitals:  Vitals:   11/26/18 1400 11/26/18 1415  BP: 93/63 97/63  Pulse: 72 71  Resp: 18 13  Temp:    SpO2: 100% 99%    Last Pain:  Vitals:   11/26/18 1400  TempSrc:   PainSc: 0-No pain   Pain Goal:    LLE Motor Response: Purposeful movement (11/26/18 1400) LLE Sensation: Tingling (11/26/18 1400) RLE Motor Response: Purposeful movement (11/26/18 1400) RLE Sensation: Tingling (11/26/18 1400)        Huston Foley

## 2018-11-26 NOTE — Progress Notes (Signed)
Pt is here for f/u miscarriage, she has been prescribed cytotec twice. Pt is currently taking iron due to heavy bleeding. Pt is having c/o fatigue, heavy bleeding and clots.

## 2018-11-26 NOTE — Anesthesia Preprocedure Evaluation (Signed)
Anesthesia Evaluation  Patient identified by MRN, date of birth, ID band Patient awake    Reviewed: Allergy & Precautions, H&P , NPO status , Patient's Chart, lab work & pertinent test results  Airway Mallampati: II  TM Distance: >3 FB Neck ROM: full    Dental no notable dental hx. (+) Teeth Intact   Pulmonary neg pulmonary ROS,    Pulmonary exam normal breath sounds clear to auscultation       Cardiovascular negative cardio ROS Normal cardiovascular exam Rhythm:regular Rate:Normal     Neuro/Psych negative psych ROS   GI/Hepatic negative GI ROS, Neg liver ROS,   Endo/Other  negative endocrine ROS  Renal/GU negative Renal ROS  negative genitourinary   Musculoskeletal   Abdominal (+) + obese,   Peds  Hematology negative hematology ROS (+)   Anesthesia Other Findings   Reproductive/Obstetrics (+) Pregnancy                             Anesthesia Physical Anesthesia Plan  ASA: II  Anesthesia Plan: Spinal   Post-op Pain Management:    Induction:   PONV Risk Score and Plan: 3 and Ondansetron and Dexamethasone  Airway Management Planned: Natural Airway and Nasal Cannula  Additional Equipment:   Intra-op Plan:   Post-operative Plan:   Informed Consent: I have reviewed the patients History and Physical, chart, labs and discussed the procedure including the risks, benefits and alternatives for the proposed anesthesia with the patient or authorized representative who has indicated his/her understanding and acceptance.       Plan Discussed with: CRNA  Anesthesia Plan Comments:         Anesthesia Quick Evaluation

## 2018-11-27 ENCOUNTER — Encounter (HOSPITAL_COMMUNITY): Payer: Self-pay | Admitting: Obstetrics and Gynecology

## 2018-12-19 ENCOUNTER — Ambulatory Visit (INDEPENDENT_AMBULATORY_CARE_PROVIDER_SITE_OTHER): Payer: BLUE CROSS/BLUE SHIELD | Admitting: Certified Nurse Midwife

## 2018-12-19 ENCOUNTER — Encounter: Payer: Self-pay | Admitting: Certified Nurse Midwife

## 2018-12-19 VITALS — BP 112/71 | HR 87 | Wt 175.6 lb

## 2018-12-19 DIAGNOSIS — Z9889 Other specified postprocedural states: Secondary | ICD-10-CM | POA: Diagnosis not present

## 2018-12-19 NOTE — Progress Notes (Signed)
Pt is here for post op D&C on 11/26/18

## 2018-12-20 LAB — CBC
Hematocrit: 30.6 % — ABNORMAL LOW (ref 34.0–46.6)
Hemoglobin: 9.7 g/dL — ABNORMAL LOW (ref 11.1–15.9)
MCH: 27.1 pg (ref 26.6–33.0)
MCHC: 31.7 g/dL (ref 31.5–35.7)
MCV: 86 fL (ref 79–97)
Platelets: 282 10*3/uL (ref 150–450)
RBC: 3.58 x10E6/uL — ABNORMAL LOW (ref 3.77–5.28)
RDW: 13.3 % (ref 11.7–15.4)
WBC: 5.8 10*3/uL (ref 3.4–10.8)

## 2018-12-20 NOTE — Progress Notes (Signed)
History:  Judy James is a 33 y.o. G2P1001 who presents to clinic today for f/u visit from [facility] that was performed on 11/26/2018 for failed medical management of missed abortion at 8 weeks.   The following portions of the patient's history were reviewed and updated as appropriate: allergies, current medications, family history, past medical history, social history, past surgical history and problem list.  Review of Systems:  Review of Systems  Constitutional: Negative.   Respiratory: Negative.   Cardiovascular: Negative.   Gastrointestinal: Negative.   Genitourinary: Negative.    Objective:  Physical Exam BP 112/71   Pulse 87   Wt 175 lb 9.6 oz (79.7 kg)   Breastfeeding No   BMI 34.29 kg/m  Physical Exam Vitals signs reviewed.  Cardiovascular:     Rate and Rhythm: Normal rate and regular rhythm.     Pulses: Normal pulses.  Pulmonary:     Effort: Pulmonary effort is normal. No respiratory distress.     Breath sounds: Normal breath sounds. No wheezing.  Abdominal:     General: Bowel sounds are normal. There is no distension.     Palpations: Abdomen is soft.     Tenderness: There is no abdominal tenderness.  Skin:    General: Skin is warm and dry.  Neurological:     Mental Status: She is alert and oriented to person, place, and time.  Psychiatric:        Mood and Affect: Mood normal.        Thought Content: Thought content normal.        Judgment: Judgment normal.    Labs and Imaging Results for orders placed or performed in visit on 12/19/18 (from the past 24 hour(s))  CBC     Status: Abnormal   Collection Time: 12/19/18 10:49 AM  Result Value Ref Range   WBC 5.8 3.4 - 10.8 x10E3/uL   RBC 3.58 (L) 3.77 - 5.28 x10E6/uL   Hemoglobin 9.7 (L) 11.1 - 15.9 g/dL   Hematocrit 30.6 (L) 34.0 - 46.6 %   MCV 86 79 - 97 fL   MCH 27.1 26.6 - 33.0 pg   MCHC 31.7 31.5 - 35.7 g/dL   RDW 13.3 11.7 - 15.4 %   Platelets 282 150 - 450 x10E3/uL   Narrative   Performed at:  [address] [place], [place]  [id number] Lab Director: Rush Farmer MD, Phone:  [phone]   Assessment & Plan:  1. S/P D&C (status post dilation and curettage) - Patient doing well, no complaints denies continued vaginal bleeding  - Reports she is currently taking iron supplements due to anemia, repeat CBC obtained in office and will manage accordingly  - Educated and discussed birth control options in detail, patient declines contraception at this time and does not intend to get pregnant as this time  - Discussed preventing pregnancy with condoms until 2 normal cycles to allow body to heal, patient verbalizes understanding.  - CBC  Lajean Manes, CNM 12/20/2018 8:31 AM

## 2019-02-05 ENCOUNTER — Telehealth: Payer: Self-pay | Admitting: Family Medicine

## 2019-02-05 NOTE — Telephone Encounter (Signed)
Called and could not leave a vm for patient. Calling to schedule virtual visit with Dr. Deborra Medina.

## 2019-07-23 ENCOUNTER — Telehealth: Payer: Self-pay

## 2019-07-23 NOTE — Telephone Encounter (Signed)
Patient called stating that she got a positive upt at home. She is requesting that she be seen earlier than 12 weeks due to a miscarriage earlier this year. I advised patient that the earliest we do initial OB visits is 10 weeks. However, she will need to come in to have a pregnancy confirmation completed before an ob visit can be scheduled. Patient verbalized understanding. Patient transferred to front staff to schedule nurse visit for pregnancy confirmation

## 2019-07-31 ENCOUNTER — Encounter (HOSPITAL_COMMUNITY): Payer: Self-pay | Admitting: *Deleted

## 2019-07-31 ENCOUNTER — Inpatient Hospital Stay (HOSPITAL_COMMUNITY): Payer: BC Managed Care – PPO

## 2019-07-31 ENCOUNTER — Other Ambulatory Visit: Payer: Self-pay

## 2019-07-31 ENCOUNTER — Inpatient Hospital Stay (HOSPITAL_COMMUNITY)
Admission: EM | Admit: 2019-07-31 | Discharge: 2019-07-31 | Disposition: A | Discharge status: Home / Self Care | Payer: BC Managed Care – PPO | Attending: Family Medicine | Admitting: Family Medicine

## 2019-07-31 DIAGNOSIS — O3481 Maternal care for other abnormalities of pelvic organs, first trimester: Secondary | ICD-10-CM | POA: Diagnosis not present

## 2019-07-31 DIAGNOSIS — Z833 Family history of diabetes mellitus: Secondary | ICD-10-CM | POA: Insufficient documentation

## 2019-07-31 DIAGNOSIS — O209 Hemorrhage in early pregnancy, unspecified: Secondary | ICD-10-CM

## 2019-07-31 DIAGNOSIS — Z880 Allergy status to penicillin: Secondary | ICD-10-CM | POA: Insufficient documentation

## 2019-07-31 DIAGNOSIS — O468X1 Other antepartum hemorrhage, first trimester: Secondary | ICD-10-CM

## 2019-07-31 DIAGNOSIS — N83209 Unspecified ovarian cyst, unspecified side: Secondary | ICD-10-CM

## 2019-07-31 DIAGNOSIS — Z3A01 Less than 8 weeks gestation of pregnancy: Secondary | ICD-10-CM | POA: Diagnosis not present

## 2019-07-31 DIAGNOSIS — O208 Other hemorrhage in early pregnancy: Secondary | ICD-10-CM | POA: Diagnosis not present

## 2019-07-31 DIAGNOSIS — O418X10 Other specified disorders of amniotic fluid and membranes, first trimester, not applicable or unspecified: Secondary | ICD-10-CM | POA: Diagnosis not present

## 2019-07-31 DIAGNOSIS — Z79899 Other long term (current) drug therapy: Secondary | ICD-10-CM | POA: Diagnosis not present

## 2019-07-31 DIAGNOSIS — O34219 Maternal care for unspecified type scar from previous cesarean delivery: Secondary | ICD-10-CM | POA: Insufficient documentation

## 2019-07-31 DIAGNOSIS — Z809 Family history of malignant neoplasm, unspecified: Secondary | ICD-10-CM | POA: Diagnosis not present

## 2019-07-31 LAB — URINALYSIS, ROUTINE W REFLEX MICROSCOPIC
Bacteria, UA: NONE SEEN
Bilirubin Urine: NEGATIVE
Glucose, UA: NEGATIVE mg/dL
Ketones, ur: NEGATIVE mg/dL
Leukocytes,Ua: NEGATIVE
Nitrite: NEGATIVE
Protein, ur: NEGATIVE mg/dL
Specific Gravity, Urine: 1.015 (ref 1.005–1.030)
pH: 6 (ref 5.0–8.0)

## 2019-07-31 LAB — HCG, QUANTITATIVE, PREGNANCY: hCG, Beta Chain, Quant, S: 33576 m[IU]/mL — ABNORMAL HIGH (ref <5)

## 2019-07-31 LAB — CBC
HCT: 36.4 % (ref 36.0–46.0)
Hemoglobin: 12.5 g/dL (ref 12.0–15.0)
MCH: 28.6 pg (ref 26.0–34.0)
MCHC: 34.3 g/dL (ref 30.0–36.0)
MCV: 83.3 fL (ref 80.0–100.0)
Platelets: 230 10*3/uL (ref 150–400)
RBC: 4.37 MIL/uL (ref 3.87–5.11)
RDW: 15.9 % — ABNORMAL HIGH (ref 11.5–15.5)
WBC: 9.9 10*3/uL (ref 4.0–10.5)
nRBC: 0 % (ref 0.0–0.2)

## 2019-07-31 LAB — POCT PREGNANCY, URINE: Preg Test, Ur: POSITIVE — AB

## 2019-07-31 MED ORDER — ACETAMINOPHEN 500 MG PO TABS
1000.0000 mg | ORAL_TABLET | Freq: Once | ORAL | Status: AC
  Administered 2019-07-31: 1000 mg via ORAL
  Filled 2019-07-31: qty 2

## 2019-07-31 NOTE — MAU Provider Note (Addendum)
History     CSN: [id number]  Arrival date and time: 07/31/19 1710   First Provider Initiated Contact with Patient 07/31/19 1919      Chief Complaint  Patient presents with  . Vaginal Bleeding   HPI Judy James is a 33 y.o. G3P1011 at [redacted]w[redacted]d who presents to MAU with chief complaint vaginal spotting. This is a new problem, onset early this afternoon. She endorses seeing scant to small spots of blood when she wiped after voiding. She became concerned when she passed a large clot around dinnertime.   Patient reports additional complaint of bilateral lower abdominal pain. This is a new problem, onset shortly after she passed the clot. She rates her pain as 6/10, non radiating. She has not taken medication or tried other treatments for this complaint.  Patient is remote from intercourse. She denies abdominal tenderness, dysuria, low back or flank pain, weakness, fever or recent illness.  OB History    Gravida  3   Para  1   Term  1   Preterm      AB  1   Living  1     SAB  1   TAB      Ectopic      Multiple      Live Births  1           Past Medical History:  Diagnosis Date  . Arthritis    knee  . Knee pain   . Migraines     Past Surgical History:  Procedure Laterality Date  . CESAREAN SECTION    . CESAREAN SECTION  2008  . DILATION AND EVACUATION N/A 11/26/2018   Procedure: DILATATION AND EVACUATION;  Surgeon: Aletha Halim, MD;  Location: [facility];  Service: Gynecology;  Laterality: N/A;  . WISDOM TOOTH EXTRACTION      Family History  Problem Relation Age of Onset  . Diabetes Mother   . Diabetes Maternal Aunt   . Hearing loss Maternal Aunt   . Heart disease Maternal Aunt   . Hypertension Maternal Aunt   . Stroke Maternal Aunt   . Alcohol abuse Maternal Uncle   . Diabetes Maternal Uncle   . Drug abuse Maternal Uncle   . Hyperlipidemia Maternal Uncle   . Hypertension Maternal Uncle   . Heart disease Maternal Uncle   . Cancer Maternal  Grandmother   . COPD Maternal Grandfather     Social History   Tobacco Use  . Smoking status: Never Smoker  . Smokeless tobacco: Never Used  Substance Use Topics  . Alcohol use: Not Currently    Alcohol/week: 0.0 standard drinks    Comment: 0-1  . Drug use: No    Allergies:  Allergies  Allergen Reactions  . Penicillin G Hives    Medications Prior to Admission  Medication Sig Dispense Refill Last Dose  . Prenatal Vit-Fe Fumarate-FA (PRENATAL MULTIVITAMIN) TABS tablet Take 1 tablet by mouth daily at 12 noon.   07/31/2019 at Unknown time  . docusate sodium (COLACE) 100 MG capsule Take 1 capsule (100 mg total) by mouth 2 (two) times daily as needed. (Patient not taking: Reported on 12/19/2018) 30 capsule 2   . ferrous sulfate 325 (65 FE) MG tablet Take 1 tablet (325 mg total) by mouth 2 (two) times daily with a meal for 30 days. 60 tablet 0   . HYDROcodone-acetaminophen (NORCO/VICODIN) 5-325 MG tablet Take 1 tablet by mouth every 6 (six) hours as needed for severe pain. (Patient not  taking: Reported on 12/19/2018) 10 tablet 0   . ibuprofen (ADVIL,MOTRIN) 600 MG tablet Take 1 tablet (600 mg total) by mouth every 6 (six) hours as needed. (Patient not taking: Reported on 12/19/2018) 60 tablet 3   . Multiple Vitamin (MULTIVITAMIN) capsule Take 1 capsule by mouth daily.     . ondansetron (ZOFRAN ODT) 4 MG disintegrating tablet Take 1 tablet (4 mg total) by mouth every 8 (eight) hours as needed for nausea or vomiting. (Patient not taking: Reported on 11/23/2018) 15 tablet 0     Review of Systems  Constitutional: Negative for chills, fatigue and fever.  Respiratory: Negative for shortness of breath.   Gastrointestinal: Positive for abdominal pain.  Genitourinary: Positive for vaginal bleeding. Negative for dysuria.  Musculoskeletal: Negative for back pain.  Neurological: Negative for dizziness, syncope and weakness.  All other systems reviewed and are negative.  Physical Exam   Blood  pressure (!) 107/57, pulse 67, temperature 98.7 F (37.1 C), temperature source Oral, resp. rate 16, height 5\' 1"  (1.549 m), weight 73.9 kg, last menstrual period 06/16/2019.  Physical Exam  Nursing note and vitals reviewed. Constitutional: She is oriented to person, place, and time. She appears well-developed and well-nourished.  Cardiovascular: Normal rate.  Respiratory: Effort normal.  GI: Soft. She exhibits no distension. There is no abdominal tenderness. There is no rebound, no guarding and no CVA tenderness.  Genitourinary:    Vaginal discharge present.     Genitourinary Comments: Small amount of dark red bleeding visible at introitus and throughout vaginal vault. No CMT   Neurological: She is alert and oriented to person, place, and time.  Skin: Skin is warm and dry.  Psychiatric: She has a normal mood and affect. Her behavior is normal. Judgment and thought content normal.    MAU Course/MDM  Procedures: sterile speculum exam, ultrasound  Patient Vitals for the past 24 hrs:  BP Temp Temp src Pulse Resp Height Weight  07/31/19 1911 (!) 107/57 98.7 F (37.1 C) Oral 67 16 - -  07/31/19 1837 (!) 124/55 98.5 F (36.9 C) - 69 18 5\' 1"  (1.549 m) 73.9 kg   Orders Placed This Encounter  Procedures  . US OB LESS THAN 14 WEEKS WITH OB TRANSVAGINAL  . Urinalysis, Routine w reflex microscopic  . CBC  . hCG, quantitative, pregnancy  . Pregnancy, urine POC   Meds ordered this encounter  Medications  . acetaminophen (TYLENOL) tablet 1,000 mg   Report given to M. Jimmye Norman, CNM who assumes care at this time.  Mallie Snooks, MSN, CNM Certified Nurse Midwife, Faculty [facility] 07/31/19 8:59 PM   Results for orders placed or performed during the hospital encounter of 07/31/19 (from the past 24 hour(s))  Urinalysis, Routine w reflex microscopic     Status: Abnormal   Collection Time: 07/31/19  6:44 PM  Result Value Ref Range   Color, Urine YELLOW YELLOW   APPearance CLEAR CLEAR    Specific Gravity, Urine 1.015 1.005 - 1.030   pH 6.0 5.0 - 8.0   Glucose, UA NEGATIVE NEGATIVE mg/dL   Hgb urine dipstick MODERATE (A) NEGATIVE   Bilirubin Urine NEGATIVE NEGATIVE   Ketones, ur NEGATIVE NEGATIVE mg/dL   Protein, ur NEGATIVE NEGATIVE mg/dL   Nitrite NEGATIVE NEGATIVE   Leukocytes,Ua NEGATIVE NEGATIVE   RBC / HPF 0-5 0 - 5 RBC/hpf   WBC, UA 0-5 0 - 5 WBC/hpf   Bacteria, UA NONE SEEN NONE SEEN   Squamous Epithelial / LPF 0-5 0 - 5  Mucus PRESENT   Pregnancy, urine POC     Status: Abnormal   Collection Time: 07/31/19  6:46 PM  Result Value Ref Range   Preg Test, Ur POSITIVE (A) NEGATIVE  CBC     Status: Abnormal   Collection Time: 07/31/19  8:00 PM  Result Value Ref Range   WBC 9.9 4.0 - 10.5 K/uL   RBC 4.37 3.87 - 5.11 MIL/uL   Hemoglobin 12.5 12.0 - 15.0 g/dL   HCT 36.4 36.0 - 46.0 %   MCV 83.3 80.0 - 100.0 fL   MCH 28.6 26.0 - 34.0 pg   MCHC 34.3 30.0 - 36.0 g/dL   RDW 15.9 (H) 11.5 - 15.5 %   Platelets 230 150 - 400 K/uL   nRBC 0.0 0.0 - 0.2 %  hCG, quantitative, pregnancy     Status: Abnormal   Collection Time: 07/31/19  8:00 PM  Result Value Ref Range   hCG, Beta Chain, Quant, S 33,576 (H) <5 mIU/mL    US Ob Less Than 14 Weeks With Ob Transvaginal  Result Date: 07/31/2019 CLINICAL DATA:  Bleeding and cramping positive urine pregnancy test EXAM: OBSTETRIC <14 WK [place] AND TRANSVAGINAL OB US TECHNIQUE: Both transabdominal and transvaginal ultrasound examinations were performed for complete evaluation of the gestation as well as the maternal uterus, adnexal regions, and pelvic cul-de-sac. Transvaginal technique was performed to assess early pregnancy. COMPARISON:  None. FINDINGS: Intrauterine gestational sac: Single Yolk sac:  Visualized. Embryo:  Visualized. Cardiac Activity: Visualized. Heart Rate: 89 bpm CRL: 2.2 mm   5 w   5 d                  [place] EDC: 03/27/2020 Subchorionic hemorrhage: Small subchorionic hemorrhage along the right inferior aspect of the  sac. Maternal uterus/adnexae: Right ovary measures 3.9 x 2.5 x 2.5 cm. There is a corpus luteum in the right ovary. Within the right adnexa, there is a solid echogenic mass measuring 5 x 4 x 3.3 cm, previously 3.9 x 2.9 x 3.8 cm. Left ovary measures 2.8 x 2.1 x 2.4 cm. Solid echogenic mass measuring 1.8 x 1.5 x 1.7 cm, previously 1.3 x 1.8 x 1.4 cm. Small area of convex bulging into the gestational sac measuring 0.5 x 0.2 x 0.6 cm. IMPRESSION: 1. Single viable intrauterine pregnancy as above with borderline fetal bradycardia. Small subchorionic hemorrhage. 2. Small convex area of bulging into the gestational sac measuring 6 mm, possible chorionic bump 3. Solid echogenic masses within the right adnexa and left ovary as above, possibly dermoids. This measures slightly larger on today's ultrasound and is grossly stable on the left side. Electronically Signed   By: Donavan Foil M.D.   On: 07/31/2019 21:15   Discussed results with patient Reviewed that the borderline low FHR may be concerning, but may improve Reviwed small [facility] may cause persistent bleeding for a while Reviewed need to followup in 2-3 wks with [place] and visit, cancel appt tomorrow  Message sent to clinic  Assessment and Plan  SIUP at [redacted]w[redacted]d SCH Borderline Fetal heart rate Bilateral ovarian cysts  Discharge home Pelvic rest Followup as above Encouraged to return here or to other Urgent Care/ED if she develops worsening of symptoms, increase in pain, fever, or other concerning symptoms.    Seabron Spates, CNM

## 2019-07-31 NOTE — MAU Note (Addendum)
Had positive HPT> Pt reports she had some spotting today when she wipped today Also c/o som abd cramping that started after the bleeding.

## 2019-07-31 NOTE — Discharge Instructions (Signed)
Vaginal Bleeding During Pregnancy, First Trimester ° °A small amount of bleeding (spotting) from the vagina is common during early pregnancy. Sometimes the bleeding is normal and does not cause problems. At other times, though, bleeding may be a sign of something serious. Tell your doctor about any bleeding from your vagina right away. °Follow these instructions at home: °Activity °· Follow your doctor's instructions about how active you can be. °· If needed, make plans for someone to help with your normal activities. °· Do not have sex or orgasms until your doctor says that this is safe. °General instructions °· Take over-the-counter and prescription medicines only as told by your doctor. °· Watch your condition for any changes. °· Write down: °? The number of pads you use each day. °? How often you change pads. °? How soaked (saturated) your pads are. °· Do not use tampons. °· Do not douche. °· If you pass any tissue from your vagina, save it to show to your doctor. °· Keep all follow-up visits as told by your doctor. This is important. °Contact a doctor if: °· You have vaginal bleeding at any time while you are pregnant. °· You have cramps. °· You have a fever. °Get help right away if: °· You have very bad cramps in your back or belly (abdomen). °· You pass large clots or a lot of tissue from your vagina. °· Your bleeding gets worse. °· You feel light-headed. °· You feel weak. °· You pass out (faint). °· You have chills. °· You are leaking fluid from your vagina. °· You have a gush of fluid from your vagina. °Summary °· Sometimes vaginal bleeding during pregnancy is normal and does not cause problems. At other times, bleeding may be a sign of something serious. °· Tell your doctor about any bleeding from your vagina right away. °· Follow your doctor's instructions about how active you can be. You may need someone to help you with your normal activities. °This information is not intended to replace advice given to  you by your health care provider. Make sure you discuss any questions you have with your health care provider. °Document Released: 02/23/2014 Document Revised: 01/28/2019 Document Reviewed: 01/10/2017 °Elsevier Patient Education © 2020 [employer] ° °

## 2019-08-05 ENCOUNTER — Other Ambulatory Visit: Payer: Self-pay | Admitting: Advanced Practice Midwife

## 2019-08-05 DIAGNOSIS — O368390 Maternal care for abnormalities of the fetal heart rate or rhythm, unspecified trimester, not applicable or unspecified: Secondary | ICD-10-CM

## 2019-08-05 NOTE — Progress Notes (Signed)
Orders only for followup US for fetal bradycardia

## 2019-08-13 ENCOUNTER — Ambulatory Visit (HOSPITAL_COMMUNITY)
Admission: RE | Admit: 2019-08-13 | Discharge: 2019-08-13 | Disposition: A | Discharge status: Home / Self Care | Payer: BC Managed Care – PPO | Source: Ambulatory Visit | Attending: Advanced Practice Midwife | Admitting: Advanced Practice Midwife

## 2019-08-13 ENCOUNTER — Ambulatory Visit (INDEPENDENT_AMBULATORY_CARE_PROVIDER_SITE_OTHER): Payer: BC Managed Care – PPO | Admitting: Nurse Practitioner

## 2019-08-13 ENCOUNTER — Encounter: Payer: Self-pay | Admitting: Nurse Practitioner

## 2019-08-13 ENCOUNTER — Other Ambulatory Visit: Payer: Self-pay

## 2019-08-13 DIAGNOSIS — O368390 Maternal care for abnormalities of the fetal heart rate or rhythm, unspecified trimester, not applicable or unspecified: Secondary | ICD-10-CM

## 2019-08-13 DIAGNOSIS — D279 Benign neoplasm of unspecified ovary: Secondary | ICD-10-CM

## 2019-08-13 DIAGNOSIS — O368310 Maternal care for abnormalities of the fetal heart rate or rhythm, first trimester, not applicable or unspecified: Secondary | ICD-10-CM | POA: Diagnosis not present

## 2019-08-13 DIAGNOSIS — O3480 Maternal care for other abnormalities of pelvic organs, unspecified trimester: Secondary | ICD-10-CM | POA: Insufficient documentation

## 2019-08-13 DIAGNOSIS — Z3A01 Less than 8 weeks gestation of pregnancy: Secondary | ICD-10-CM | POA: Diagnosis not present

## 2019-08-13 DIAGNOSIS — O3481 Maternal care for other abnormalities of pelvic organs, first trimester: Secondary | ICD-10-CM

## 2019-08-13 DIAGNOSIS — Z3491 Encounter for supervision of normal pregnancy, unspecified, first trimester: Secondary | ICD-10-CM

## 2019-08-13 MED ORDER — VITAFOL ULTRA 29-0.6-0.4-200 MG PO CAPS: 1.0000 | ORAL_CAPSULE | Freq: Every day | ORAL | 11 refills | Status: AC

## 2019-08-13 NOTE — Progress Notes (Signed)
Virtual Appt To discuss  U/S results.

## 2019-08-13 NOTE — Patient Instructions (Signed)
Morning Sickness  Morning sickness is when you feel sick to your stomach (nauseous) during pregnancy. You may feel sick to your stomach and throw up (vomit). You may feel sick in the morning, but you can feel this way at any time of day. Some women feel very sick to their stomach and cannot stop throwing up (hyperemesis gravidarum). Follow these instructions at home: Medicines  Take over-the-counter and prescription medicines only as told by your doctor. Do not take any medicines until you talk with your doctor about them first.  Taking multivitamins before getting pregnant can stop or lessen the harshness of morning sickness. Eating and drinking  Eat dry toast or crackers before getting out of bed.  Eat 5 or 6 small meals a day.  Eat dry and bland foods like rice and baked potatoes.  Do not eat greasy, fatty, or spicy foods.  Have someone cook for you if the smell of food causes you to feel sick or throw up.  If you feel sick to your stomach after taking prenatal vitamins, take them at night or with a snack.  Eat protein when you need a snack. Nuts, yogurt, and cheese are good choices.  Drink fluids throughout the day.  Try ginger ale made with real ginger, ginger tea made from fresh grated ginger, or ginger candies. General instructions  Do not use any products that have nicotine or tobacco in them, such as cigarettes and e-cigarettes. If you need help quitting, ask your doctor.  Use an air purifier to keep the air in your house free of smells.  Get lots of fresh air.  Try to avoid smells that make you feel sick.  Try: ? Wearing a bracelet that is used for seasickness (acupressure wristband). ? Going to a doctor who puts thin needles into certain body points (acupuncture) to improve how you feel. Contact a doctor if:  You need medicine to feel better.  You feel dizzy or light-headed.  You are losing weight. Get help right away if:  You feel very sick to your  stomach and cannot stop throwing up.  You pass out (faint).  You have very bad pain in your belly. Summary  Morning sickness is when you feel sick to your stomach (nauseous) during pregnancy.  You may feel sick in the morning, but you can feel this way at any time of day.  Making some changes to what you eat may help your symptoms go away. This information is not intended to replace advice given to you by your health care provider. Make sure you discuss any questions you have with your health care provider. Document Released: 11/16/2004 Document Revised: 09/21/2017 Document Reviewed: 11/09/2016 Elsevier Patient Education  2020 Else[employer] Inc.  

## 2019-08-13 NOTE — Progress Notes (Signed)
Judy James 33 y.o.  On virtual visit today on [employer] to review ultrasound results. Reviewed today's ultrasound results and discussed with client. Very happy to be pregnant at [redacted]w[redacted]d with [place] of 166. Reviewed 2 bilateral dermoid cysts. Has appointments to start prenatal care. PNV prescribed No smoking, no drugs, no alcohol.  Take a prenatal vitamin one by mouth every day.  Eat small frequent snacks to avoid nausea.  Begin prenatal care as soon as possible. Visit 5 minutes DX  Pregnancy IUP [place] bilateral ovarian csts See plan above.  Earlie Server, RN, MSN, NP-BC Nurse Practitioner, [facility] for [employer], [facility] 08/13/2019 4:57 PM

## 2019-08-26 ENCOUNTER — Ambulatory Visit (INDEPENDENT_AMBULATORY_CARE_PROVIDER_SITE_OTHER): Payer: BC Managed Care – PPO | Admitting: *Deleted

## 2019-08-26 DIAGNOSIS — Z3481 Encounter for supervision of other normal pregnancy, first trimester: Secondary | ICD-10-CM

## 2019-08-26 MED ORDER — BLOOD PRESSURE MONITOR KIT: 1.0000 | PACK | Freq: Once | 0 refills | Status: DC

## 2019-08-26 MED ORDER — BLOOD PRESSURE MONITOR KIT: 1.0000 | PACK | Freq: Once | 0 refills | Status: AC

## 2019-08-26 NOTE — Progress Notes (Signed)
I connected with  Judy James on 08/26/19 by a video enabled telemedicine application and verified that I am speaking with the correct person using two identifiers.   I discussed the limitations of evaluation and management by telemedicine. The patient expressed understanding and agreed to proceed.   PRENATAL INTAKE SUMMARY  [name] presents today New OB Nurse Interview.  OB History    Gravida  3   Para  1   Term  1   Preterm      AB  1   Living  1     SAB  1   TAB      Ectopic      Multiple      Live Births  1          I have reviewed the patient's medical, obstetrical, social, and family histories, medications, and available lab results.  SUBJECTIVE She has no unusual complaints  OBJECTIVE Telehealth visit for OB Intake Initial Physical Exam (New OB)  GENERAL APPEARANCE: alert, sounds well via televisit.   ASSESSMENT Normal pregnancy  PLAN Prenatal care Babyscripts ordered today BP cuff ordered today.

## 2019-08-26 NOTE — Progress Notes (Signed)
I have reviewed this chart and agree with the RN/CMA assessment and management.    K. Meryl Wasim Hurlbut, M.D. Attending [facility] (Faculty Practice)   

## 2019-09-05 ENCOUNTER — Other Ambulatory Visit (HOSPITAL_COMMUNITY)
Admission: RE | Admit: 2019-09-05 | Discharge: 2019-09-05 | Disposition: A | Discharge status: Home / Self Care | Payer: BC Managed Care – PPO | Source: Ambulatory Visit | Attending: Family Medicine | Admitting: Family Medicine

## 2019-09-05 ENCOUNTER — Encounter: Payer: Self-pay | Admitting: Family Medicine

## 2019-09-05 ENCOUNTER — Ambulatory Visit (INDEPENDENT_AMBULATORY_CARE_PROVIDER_SITE_OTHER): Payer: BC Managed Care – PPO | Admitting: Family Medicine

## 2019-09-05 ENCOUNTER — Other Ambulatory Visit: Payer: Self-pay

## 2019-09-05 VITALS — BP 112/74 | HR 77 | Wt 168.0 lb

## 2019-09-05 DIAGNOSIS — Z3401 Encounter for supervision of normal first pregnancy, first trimester: Secondary | ICD-10-CM | POA: Insufficient documentation

## 2019-09-05 DIAGNOSIS — Z3A11 11 weeks gestation of pregnancy: Secondary | ICD-10-CM

## 2019-09-05 DIAGNOSIS — O34219 Maternal care for unspecified type scar from previous cesarean delivery: Secondary | ICD-10-CM

## 2019-09-05 DIAGNOSIS — Z3481 Encounter for supervision of other normal pregnancy, first trimester: Secondary | ICD-10-CM

## 2019-09-05 DIAGNOSIS — D279 Benign neoplasm of unspecified ovary: Secondary | ICD-10-CM

## 2019-09-05 DIAGNOSIS — O3481 Maternal care for other abnormalities of pelvic organs, first trimester: Secondary | ICD-10-CM

## 2019-09-05 DIAGNOSIS — Z3480 Encounter for supervision of other normal pregnancy, unspecified trimester: Secondary | ICD-10-CM | POA: Insufficient documentation

## 2019-09-05 DIAGNOSIS — O99891 Other specified diseases and conditions complicating pregnancy: Secondary | ICD-10-CM

## 2019-09-05 NOTE — Progress Notes (Signed)
Last pap 11/2017- Normal

## 2019-09-05 NOTE — Progress Notes (Addendum)
Subjective:  Judy James is a 33 y.o. G3P1011 at [redacted]w[redacted]d being seen today for her first prenatal visit. This pregnancy was not planned, but is desired. She is currently monitored for the following issues for this low-risk pregnancy and has Screen for STD (sexually transmitted disease); Amenorrhea; STD exposure; Incomplete abortion; Ovarian dermoid cyst complicating pregnancy, antepartum, unspecified trimester; Encounter for supervision of normal first pregnancy in first trimester; and History of cesarean delivery, currently pregnant on their problem list.  Patient reports nausea with prenatal vitamin.  Contractions: Not present. Vag. Bleeding: None.   . Denies leaking of fluid.   The following portions of the patient's history were reviewed and updated as appropriate: allergies, current medications, past family history, past medical history, past social history, past surgical history and problem list. Problem list updated.  Objective:   Vitals:   09/05/19 0912  BP: 112/74  Pulse: 77  Weight: 168 lb (76.2 kg)    Fetal Status: Fetal Heart Rate (bpm): 165         General:  Alert, oriented and cooperative. Patient is in no acute distress.  Skin: Skin is warm and dry. No rash noted.   Cardiovascular: Normal heart rate noted  Respiratory: Normal respiratory effort, no problems with respiration noted  Abdomen: Soft, gravid, appropriate for gestational age. Pain/Pressure: Absent     Pelvic: Vag. Bleeding: None     Cervical exam deferred        Extremities: Normal range of motion.     Mental Status: Normal mood and affect. Normal behavior. Normal judgment and thought content.     Assessment and Plan:  Pregnancy: G3P1011 at [redacted]w[redacted]d  1. Encounter for supervision of normal first pregnancy in first trimester - Pap 2019 normal, repeat due 2022 - Obstetric Panel, Including HIV - Culture, OB Urine - Urine cytology ancillary only([place])  2. History of cesarean delivery, currently  pregnant - Reports labor for 31 hours, CS after pushing 3 hours, NRFHR - Desires TOLAC, discussed getting Op Note first  3. Ovarian dermoid cyst complicating pregnancy, antepartum, unspecified trimester -reviewed: Right 4.8 cm, Left 1.7 cm on [place] (08/13/19)  Preterm labor symptoms and general obstetric precautions including but not limited to vaginal bleeding, contractions, leaking of fluid and fetal movement were reviewed in detail with the patient. Please refer to After Visit Summary for other counseling recommendations.  Return in about 4 weeks (around 10/03/2019) for ROB.   Sharetta Ricchio L, DO

## 2019-09-06 LAB — OBSTETRIC PANEL, INCLUDING HIV
Antibody Screen: NEGATIVE
Basophils Absolute: 0 10*3/uL (ref 0.0–0.2)
Basos: 0 %
EOS (ABSOLUTE): 0.1 10*3/uL (ref 0.0–0.4)
Eos: 1 %
HIV Screen 4th Generation wRfx: NONREACTIVE
Hematocrit: 36.5 % (ref 34.0–46.6)
Hemoglobin: 12.2 g/dL (ref 11.1–15.9)
Hepatitis B Surface Ag: NEGATIVE
Immature Grans (Abs): 0.1 10*3/uL (ref 0.0–0.1)
Immature Granulocytes: 1 %
Lymphocytes Absolute: 1.7 10*3/uL (ref 0.7–3.1)
Lymphs: 19 %
MCH: 28.7 pg (ref 26.6–33.0)
MCHC: 33.4 g/dL (ref 31.5–35.7)
MCV: 86 fL (ref 79–97)
Monocytes Absolute: 0.6 10*3/uL (ref 0.1–0.9)
Monocytes: 7 %
Neutrophils Absolute: 6.8 10*3/uL (ref 1.4–7.0)
Neutrophils: 72 %
Platelets: 227 10*3/uL (ref 150–450)
RBC: 4.25 x10E6/uL (ref 3.77–5.28)
RDW: 15.9 % — ABNORMAL HIGH (ref 11.7–15.4)
RPR Ser Ql: NONREACTIVE
Rh Factor: POSITIVE
Rubella Antibodies, IGG: 2.88 index (ref >0.99)
WBC: 9.3 10*3/uL (ref 3.4–10.8)

## 2019-09-08 LAB — URINE CULTURE, OB REFLEX

## 2019-09-08 LAB — CULTURE, OB URINE

## 2019-09-08 LAB — URINE CYTOLOGY ANCILLARY ONLY
Chlamydia: NEGATIVE
Comment: NEGATIVE
Comment: NORMAL
Neisseria Gonorrhea: NEGATIVE

## 2019-10-03 ENCOUNTER — Encounter: Payer: Self-pay | Admitting: Medical

## 2019-10-03 ENCOUNTER — Telehealth (INDEPENDENT_AMBULATORY_CARE_PROVIDER_SITE_OTHER): Payer: BC Managed Care – PPO | Admitting: Medical

## 2019-10-03 VITALS — Ht 61.0 in

## 2019-10-03 DIAGNOSIS — Z3A15 15 weeks gestation of pregnancy: Secondary | ICD-10-CM

## 2019-10-03 DIAGNOSIS — O900 Disruption of cesarean delivery wound: Secondary | ICD-10-CM | POA: Diagnosis not present

## 2019-10-03 DIAGNOSIS — O34219 Maternal care for unspecified type scar from previous cesarean delivery: Secondary | ICD-10-CM

## 2019-10-03 DIAGNOSIS — O3482 Maternal care for other abnormalities of pelvic organs, second trimester: Secondary | ICD-10-CM

## 2019-10-03 DIAGNOSIS — D279 Benign neoplasm of unspecified ovary: Secondary | ICD-10-CM

## 2019-10-03 DIAGNOSIS — O339 Maternal care for disproportion, unspecified: Secondary | ICD-10-CM | POA: Diagnosis not present

## 2019-10-03 DIAGNOSIS — G43809 Other migraine, not intractable, without status migrainosus: Secondary | ICD-10-CM

## 2019-10-03 DIAGNOSIS — Z3401 Encounter for supervision of normal first pregnancy, first trimester: Secondary | ICD-10-CM

## 2019-10-03 MED ORDER — COMFORT FIT MATERNITY SUPP LG MISC: 1.0000 [IU] | Freq: Every day | 0 refills | Status: AC | PRN

## 2019-10-03 MED ORDER — BLOOD PRESSURE KIT DEVI: 1.0000 | 0 refills | Status: DC

## 2019-10-03 NOTE — Patient Instructions (Addendum)
AREA PEDIATRIC/FAMILY PRACTICE PHYSICIANS  [place] ([address]) . [facility] o Chambliss, MD; Eniola, MD; Hale, MD; Hensel, MD; McDiarmid, MD; McIntyer, MD; Neal, MD; Walden, MD o [address] [place], [place] [address] o [phone] o Mon-Fri 8:30-12:30, 1:30-5:00 o Providers come to see babies at [facility] o Accepting Medicaid . [facility] o Limited providers who accept newborns: Koirala, MD; Morrow, MD; Wolters, MD o [address], [place], [place] [address] o [phone] o Mon-Fri 8:00-5:30 o Babies seen by providers at [facility] o Does NOT accept Medicaid o Please call early in hospitalization for appointment (limited availability)  . [facility] o Mulberry, MD o [address] [place], [place] [address] o [phone] o Mon, Tue, Thur, Fri 8:30-5:00, Wed 10:00-7:00 (closed 1-2pm) o Babies seen by [facility] providers o Accepting Medicaid . [facility] - Pediatrician o Rubin, MD o [address], [place], [place] [address] o [phone] o Mon-Fri 8:30-5:00, Sat 8:30-12:00 o Provider comes to see babies at [facility] o Accepting Medicaid o Must have been referred from current patients or contacted office prior to delivery . [facility] ([facility]) o Brown, MD; Chandler, MD; Ettefagh, MD; Grant, MD; Lester, MD; McCormick, MD; McQueen, MD; Prose, MD; Simha, MD; Stanley, MD; Stryffeler, NP; Tebben, NP o [address], [place], [place] [address] o [phone] o Mon, Tue, Thur, Fri 8:30-5:30, Wed 9:30-5:30, Sat 8:30-12:30 o Babies seen by [facility] providers o Accepting Medicaid o Only accepting infants of first-time parents or siblings of current patients o Hospital discharge coordinator will make follow-up appointment . Jack Amos o [address],  [place], [place]  [address] o [phone]   Fax - [phone] . [facility] o [address], [place], [place]  [address] o Phone - [phone]   Fax - [phone] . Shilpa Gosrani o [address], [place], [place]  [address] o [phone]  [place]ort[place] ([address]) . [facility] o Bates, MD; Brassfield, MD; Cooper, Cox, MD; MD; Davis, MD; Dovico, MD; Ettefaugh, MD; Little, MD; Lowe, MD; Keiffer, MD; Melvin, MD; Sumner, MD; Williams, MD o [address], [place], [place] [address] o [phone] o Mon-Fri 8:30-5:00 (extended evenings Mon-Thur as needed), Sat-Sun 10:00-1:00 o Providers come to see babies at [facility] o Accepting Medicaid for families of first-time babies and families with all children in the household age 3 and under. Must register with office prior to making appointment (M-F only). . [facility] o Henson, NP; Knapp, MD; Lalonde, MD; Tysinger, PA o [address] [place], [place] [address] o [phone] o Mon-Fri 8:00-5:00 o Babies seen by providers at [facility] o Does NOT accept Medicaid/Commercial Insurance Only . [facility] ([facility])  o Artis, MD; Barnes, MD; Bratton, MD; Coccaro, MD; Lockett Gardner, MD; Kramer, MD; Marshall, MD; Netherton, MD; Poleto, MD; Skinner, MD o [address] [place], [place] [address] o [phone] o Mon-Fri 8:30-5:30, Sat (Oct.-Mar.) 9:00-1:00 o Babies seen by providers at [facility] o Accepting Medicaid  [place] ([address]) . [facility] o Reid, MD; Warner, MD o [address], [place], [place] [address] o [phone] o Mon-Fri 8:30-5:00, Sat 8:30-12:00 o Providers come to see babies at [facility] o Does NOT accept Medicaid . [facility] o Becker, PA; Hagler, MD; Scifres, PA; Sun, MD; Swayne, MD o [address],  [place], [place] [address] o [phone] o Mon-Fri 8:00-5:00 o Babies seen by providers at [facility] o Does NOT accept Medicaid o Only accepting babies of parents who are patients o Please call early in hospitalization for appointment (limited availability) . [facility] o Clark, MD; Frye, MD; Kelleher, MD; Mack, NP; Miller, MD; O'Keller, MD; Patterson, NP; Pudlo, MD; Puzio, MD; Thomas, MD; Tucker, MD; Twiselton, MD o [address], [place], [place] [address] o [phone] o Mon-Fri 8:00-5:00, Sat 9:00-12:00 o Providers come to see babies at [facility] o Does NOT accept Medicaid  [place] ([address]) . [facility] o Limited providers accepting new patients: Brake, NP; Wharton, PA o [address], [place], [place] [address] o [phone] o Mon-Fri 8:00-5:00 o Babies seen by providers at [facility] o Does NOT accept Medicaid o Only accepting babies of parents who are patients o Please call early in hospitalization for appointment (limited availability) . [facility] o Gay, MD; Quinlan, MD o [address] [place], [place] [address] o [phone] (press 1 to schedule appointment) o Mon-Fri 8:00-5:00 o Providers come to see babies at [facility] o Does NOT accept Medicaid . [facility] o Mazer, MD o [address] [place], [place] [address] o [phone] o Mon-Fri 8:30-5:00 (lunch 12:30-1:00), extended hours by appointment only Wed 5:00-6:30 o Babies seen by [facility] providers o Accepting Medicaid . [facility] o Banks, MD; Jordan, MD; Koberlein, MD o [address], [place], [place] [address] o [phone] o Mon-Fri 8:00-5:00 o Babies seen by [facility] providers o Does NOT accept Medicaid . [facility] o Parker, MD; Hunter, MD; Wallace, DO o [address] [place], [place]  [address] o [phone] o Mon-Fri 8:00-5:00 o Babies seen by [facility] providers o Does NOT accept Medicaid . [facility] o Brandon, PA; Brecken, PA; Christy, NP; Dees, MD; DeClaire, MD; DeWeese, MD; Hansen, NP; Mills, NP; Parrish, NP; Smoot, NP; Summer, MD; Vapne, MD o [address] [place], [place] [address] o [phone] o Mon-Fri 8:30-5:00, Sat 10:00-1:00 o Providers come to see babies at [facility] o Does NOT accept Medicaid o Free prenatal information session Tuesdays at 4:45pm . [facility] o Bouska, MD; Gordon, PA; Jeffery, PA; Weber, PA o [address] [place] [address] o [phone] o Mon-Fri 7:30-5:30 o Babies seen by [facility] providers . [facility] o [address], [place], [place]  [address] o [phone]   Fax - [phone]  [place] ([address] & [address]) . [facility] o Reese, MD o [address] [place], [place] [address] o [phone] o Mon-Thur 8:00-6:00 o Providers come to see babies at [facility] o Accepting Medicaid . [facility] o Anderson, NP; Badger, MD; Beal, PA; Spencer, PA o [address] [place], [place] [address] o [phone] o Mon-Thur 7:30-7:30, Fri 7:30-4:30 o Babies seen by [facility] providers o Accepting Medicaid . [facility] o Agbuya, MD; Klett, NP; Romgoolam, MD o [address], [place],  [address] o [phone] o Mon-Fri 8:30-5:00, Sat 8:30-12:00 o Providers come to see babies at [facility] o Accepting Medicaid o Must have "Meet & Greet" appointment at office prior to delivery . [facility] ([facility] o McCord,   MD; Juleen China, MD; Clydene Laming, [place] [address], [place], [place] [address] o [phone] o Mon-Wed 8:00-6:00, Thur-Fri 8:00-5:00, Sat 9:00-12:00 o Providers come to  see babies at [facility] o Does NOT accept Medicaid o Only accepting siblings of current patients . [facility], [place], [place], [place]  [address] o [phone]   Fax [phone] . [facility] [place] [address], [place], [place]  [address] o [phone]   Fax - Morton Gorman [phone] & [phone]) . Therapist, music at [place], DO; [place], [address] [place], [place] [address] o [phone] o Mon-Fri 7:00-5:00 o Babies seen by [facility] providers o Does NOT accept Medicaid . [place], MD; [place], [place]; [place], [place], [place], [place] [address] o [phone] o Mon-Fri 8:00-5:00 o Babies seen by [facility] providers o Accepting Medicaid . [place], MD; [place], [place]; [place], NP; [place], [place], [place], [place] [address] o [phone] o Mon-Fri 8:00-5:00 o Babies seen by providers at [facility] [place] [phone]) . [facility], [place] o [address] [place], [place] [address] o [phone] o Mon-Fri 8:00-5:00 o Babies seen by [facility] providers o Does NOT accept Medicaid o Limited availability, please call early in hospitalization to schedule follow-up . [facility] Leilani Merl, PA; Maisie Fus, MD; [place], MD; [place], [place]; Jeannine Kitten, MD; [place], [place] [facility] [address], [place], [place] [address] o [phone] o Mon-Fri 8:30-5:00, Sat 9:00-12:00 o Babies seen by providers at [facility] o Accepting Medicaid o Please register online then schedule online or call office o [email or web address] . [place] [facility] [facility]  [place]) Kristian Covey, NP; Dwyane Dee, MD; Leonidas Romberg, PA o [address] [place], [place], [place] [address] o [phone] o Mon-Fri 8:00-5:00 o Babies seen by providers at [facility] o Accepting Medicaid . [place] ([facility]) Dairl Ponder, MD; Rayvon Char, NP; Melina Modena, MD o [address] [place], [place], [place] [address] o [phone] o Mon-Fri 8:00-5:30, Sat&Sun by appointment (phones open at 8:30) o Babies seen by [facility] providers o Accepting Medicaid o Must be a first-time baby or sibling of current patient . [place], [address], [place], [place]  [address] o [phone]   Fax - [phone]  [place] [phone] & [phone]) . [place], [place]; [place], [place]; Benjamine Mola, MD; [place], [place]; Harrell Lark, MD o [address] [place], [place] [address] o [phone] o Mon-Thur 8:00-7:00, Fri 8:00-5:00, Sat 8:00-12:00, Sun 9:00-12:00 o Babies seen by [facility] providers o Accepting Medicaid . [facility], MD; Ruthann Cancer, MD; [facility], MD o [address], [place], [place] [address] o [phone] o Mon-Thur 8:00-5:00 o Babies seen by providers at [facility] o Accepting Medicaid . [facility] [place], MD; Coe-Goins, MD; Amedeo Plenty, MD; Bobby Rumpf, MD; List, MD; Lavonia Drafts, MD; Ruthann Cancer, MD; Selinda Eon, MD; Audie Box, MD; Jim Like, MD; Christie Nottingham, MD; Hubbard Hartshorn, MD; Modena Nunnery, MD o [address] [place], [place]  [address] o [phone] o Mon-Fri 8:00-5:30, Sat (Oct.-Mar.) 9:00-1:00 o Babies seen by providers at [facility] o Accepting Medicaid o Must fill out new patient packet, available online at [email or web address] . [place] ([facility]) Barnabas Lister, NP; Kenton Kingfisher, NP; Claiborne Billings, NP; Rolla Plate, MD;  [place], [place]; Carola Rhine, MD; Tyron Russell, MD; Delia Chimes, NP o [address], [place], [place] [address] o [phone] o Mon-Thur 8:00-5:30, Fri 8:00-5:00 o Babies seen by providers at [place] [phone]) . [place], [place]; Marvin, MD; Dennard Schaumann, MD; [place], [place] o [address] [place], [place] [address] o [phone] o Mon-Fri 8:00-5:00 o Babies seen by providers at [place] [phone]) . [facility] [place], [place]; Olen Pel, MD; [place], [place], [place], [place] [address] o [phone] o Mon-Fri 8:00-5:00 o Babies seen by providers at [facility] o Does NOT accept Medicaid o Limited appointment availability, please call early in hospitalization  . Therapist, music at [place], [place]; [place], [place], [place] [address] o [phone] o Mon-Fri 8:00-5:00 o Babies seen by [facility] providers o Does NOT accept Medicaid . [facility] Su Grand, MD; Guy Sandifer, MD; [place], [place]; [place], [address], [place], [place] [address] o [phone] o Mon-Fri 8:00-5:00 o After hours clinic [facility] [place], [place] [address]) [phone] Mon-Fri 5:00-8:00, Sat 12:00-6:00, Sun 10:00-4:00 o Babies seen by [facility] providers o Accepting Medicaid . [place] [facility] o [address] [place], [place]  [address] o [phone]   Fax - [phone]  [place] [phone]) . Therapist, music at [facility], MD o [address] [place], [place], [place] [address] o [phone] o Mon-Fri 8:00-5:00 o Babies seen by [facility] providers o Does NOT accept Medicaid . [place] ([place] [facility] [place]) Bing Neighbors, MD o [address] [place], [place],   [address] o [phone] o Mon-Thur 8:00-7:00, Fri 8:00-5:00, Sat 8:00-12:00 o Babies seen by providers at [facility] o Accepting Medicaid - but does not have vaccinations in office (must be received elsewhere) o Limited availability, please call early in hospitalization  [place] ([address]) . [place], [place] [address], [place] [address] o [phone]  Fax [phone]   Second Trimester of Pregnancy  The second trimester is from week 14 through week 27 (month 4 through 6). This is often the time in pregnancy that you feel your best. Often times, morning sickness has lessened or quit. You may have more energy, and you may get hungry more often. Your unborn baby is growing rapidly. At the end of the sixth month, he or she is about 9 inches long and weighs about 1 pounds. You will likely feel the baby move between 18 and 20 weeks of pregnancy. Follow these instructions at home: Medicines  Take over-the-counter and prescription medicines only as told by your doctor. Some medicines are safe and some medicines are not safe during pregnancy.  Take a prenatal vitamin that contains at least 600 micrograms (mcg) of folic acid.  If you have trouble pooping (constipation), take medicine that will make your stool soft (stool softener) if your doctor approves. Eating and drinking  Eat regular, healthy meals.  Avoid raw meat and uncooked cheese.  If you get low calcium from the food you eat, talk to your doctor about taking a daily calcium supplement.  Avoid foods that are high in fat and sugars, such as fried and sweet foods.  If you feel sick to your stomach (nauseous) or throw up (vomit): ? Eat 4 or 5 small meals a day instead of 3 large meals. ? Try eating a few soda crackers. ? Drink liquids between meals instead of during meals.  To prevent constipation: ? Eat foods that are high in fiber, like fresh fruits and vegetables, whole  grains, and beans. ? Drink enough fluids to keep your pee (urine) clear or pale yellow. Activity  Exercise only as told by your doctor. Stop exercising if you start to have cramps.  Do not exercise if it is too hot, too humid, or if you are in a place of great height (high altitude).  Avoid heavy lifting.  Wear low-heeled shoes. Sit and stand up straight.  You can continue to have sex unless your doctor tells you not to. Relieving pain and discomfort  Wear a good support bra if your breasts are tender.  Take warm water baths (sitz baths) to soothe pain or discomfort caused by hemorrhoids. Use hemorrhoid cream if your doctor approves.  Rest with your legs raised if you have leg cramps or low back pain.  If you develop puffy, bulging veins (varicose veins) in your legs: ? Wear support hose or compression stockings as told by your doctor. ? Raise (elevate) your feet for 15 minutes, 3-4 times a day. ? Limit salt in your food. Prenatal care  Write down your questions. Take them to your prenatal visits.  Keep all your prenatal visits as told by your doctor. This is important. Safety  Wear your seat belt when driving.  Make a list of emergency phone numbers, including numbers for family, friends, the hospital, and police and fire departments. General instructions  Ask your doctor about the right foods to eat or for help finding a counselor, if you need these services.  Ask your doctor about local prenatal classes. Begin classes before month 6 of your pregnancy.  Do not use hot tubs, steam rooms, or saunas.  Do not douche or use tampons or scented sanitary pads.  Do not cross your legs for long periods of time.  Visit your dentist if you have not done so. Use a soft toothbrush to brush your teeth. Floss gently.  Avoid all smoking, herbs, and alcohol. Avoid drugs that are not approved by your doctor.  Do not use any products that contain nicotine or tobacco, such as  cigarettes and e-cigarettes. If you need help quitting, ask your doctor.  Avoid cat litter boxes and soil used by cats. These carry germs that can cause birth defects in the baby and can cause a loss of your baby (miscarriage) or stillbirth. Contact a doctor if:  You have mild cramps or pressure in your lower belly.  You have pain when you pee (urinate).  You have bad smelling fluid coming from your vagina.  You continue to feel sick to your stomach (nauseous), throw up (vomit), or have watery poop (diarrhea).  You have a nagging pain in your belly area.  You feel dizzy. Get help right away if:  You have a fever.  You are leaking fluid from your vagina.  You have spotting or bleeding from your vagina.  You  have severe belly cramping or pain.  You lose or gain weight rapidly.  You have trouble catching your breath and have chest pain.  You notice sudden or extreme puffiness (swelling) of your face, hands, ankles, feet, or legs.  You have not felt the baby move in over an hour.  You have severe headaches that do not go away when you take medicine.  You have trouble seeing. Summary  The second trimester is from week 14 through week 27 (months 4 through 6). This is often the time in pregnancy that you feel your best.  To take care of yourself and your unborn baby, you will need to eat healthy meals, take medicines only if your doctor tells you to do so, and do activities that are safe for you and your baby.  Call your doctor if you get sick or if you notice anything unusual about your pregnancy. Also, call your doctor if you need help with the right food to eat, or if you want to know what activities are safe for you. This information is not intended to replace advice given to you by your health care provider. Make sure you discuss any questions you have with your health care provider. Document Released: 01/03/2010 Document Revised: 01/31/2019 Document Reviewed:  11/14/2016 Elsevier Patient Education  [address], [place], [place] [address] Phone: [phone]  Monday     8:30AM-5PM Tuesday 8:30AM-5PM Wednesday 8:30AM-5PM Thursday 8:30AM-5PM Friday  8:30AM-5PM Saturday Closed Sunday Closed

## 2019-10-03 NOTE — Progress Notes (Signed)
I connected with Judy James on 10/03/19 at  8:30 AM EST by: MyChart and verified that I am speaking with the correct person using two identifiers.  Patient is located at home and provider is located at [facility].     The purpose of this virtual visit is to provide medical care while limiting exposure to the novel coronavirus. I discussed the limitations, risks, security and privacy concerns of performing an evaluation and management service by MyChart and the availability of in person appointments. I also discussed with the patient that there may be a patient responsible charge related to this service. By engaging in this virtual visit, you consent to the provision of healthcare.  Additionally, you authorize for your insurance to be billed for the services provided during this visit.  The patient expressed understanding and agreed to proceed.  The following staff members participated in the virtual visit:  Jasmine Awe    PRENATAL VISIT NOTE  Subjective:  Judy James is a 33 y.o. G3P1011 at [redacted]w[redacted]d  for phone visit for ongoing prenatal care.  She is currently monitored for the following issues for this low-risk pregnancy and has Migraines; Incomplete abortion; Ovarian dermoid cyst complicating pregnancy, antepartum, unspecified trimester; Encounter for supervision of normal first pregnancy in first trimester; and History of cesarean delivery, currently pregnant on their problem list.  Patient reports backache.  Contractions: Not present. Vag. Bleeding: None.  Movement: Present. Denies leaking of fluid.   The following portions of the patient's history were reviewed and updated as appropriate: allergies, current medications, past family history, past medical history, past social history, past surgical history and problem list.   Objective:   Vitals:   10/03/19 0833  Height: 5\' 1"  (1.549 m)   Self-Obtained  Fetal Status:     Movement: Present     Assessment and Plan:  Pregnancy:  G3P1011 at [redacted]w[redacted]d 1. Encounter for supervision of normal first pregnancy in first trimester - Doing well - Anatomy US scheduled 10/31/19 - BP Cuff resent to [employer]. Patient aware.   2. History of cesarean delivery, currently pregnant - Planning TOLAC - Op note under media - Plan to sign consent at 28 weeks   3. Ovarian dermoid cyst complicating pregnancy, antepartum, unspecified trimester  4. Other migraine without status migrainosus, not intractable - Ambulatory referral to Neurology  5. Back pain in pregnancy - Abdominal binder ordered and information for [employer] given   Preterm labor/ second trimester warning symptoms and general obstetric precautions including but not limited to vaginal bleeding, contractions, leaking of fluid and fetal movement were reviewed in detail with the patient.  Return in about 4 weeks (around 10/31/2019) for LOB, In-Person. patient requests in-person visit. Advised that depending on COVID cases in [place] this may need to be changed as the appointment approaches. Patient voiced understanding.   Future Appointments  Date Time Provider [place]  10/31/2019 10:00 AM WH-MFC [place] 3 WH-MFCUS MFC-US     Time spent on virtual visit: 15 minutes  Kerry Hough, PA-C

## 2019-10-03 NOTE — Progress Notes (Signed)
I connected with  [name] on 10/03/19 by a video enabled telemedicine application and verified that I am speaking with the correct person using two identifiers.   I discussed the limitations of evaluation and management by telemedicine. The patient expressed understanding and agreed to proceed.  ROB.  She forgot to pick up BP Cuff.  It has been reordered.  C/o severe headaches  25/10 x1 year, it hurts to breathe, or function daily, lower back pain 10/10 x 6 weeks.  Needs RX for headache, PNV, Iron tablet.

## 2019-10-08 ENCOUNTER — Other Ambulatory Visit: Payer: Self-pay

## 2019-10-08 ENCOUNTER — Ambulatory Visit (INDEPENDENT_AMBULATORY_CARE_PROVIDER_SITE_OTHER): Payer: BC Managed Care – PPO | Admitting: Diagnostic Neuroimaging

## 2019-10-08 ENCOUNTER — Encounter: Payer: Self-pay | Admitting: Diagnostic Neuroimaging

## 2019-10-08 VITALS — BP 106/58 | HR 77 | Temp 97.8°F | Ht 61.0 in | Wt 178.2 lb

## 2019-10-08 DIAGNOSIS — R519 Headache, unspecified: Secondary | ICD-10-CM

## 2019-10-08 DIAGNOSIS — O26891 Other specified pregnancy related conditions, first trimester: Secondary | ICD-10-CM

## 2019-10-08 NOTE — Progress Notes (Signed)
[facility]  PATIENT: Judy James DOB: 10-Dec-1985  REFERRING CLINICIAN: Lucille Passy, MD [place],  [place] [address]  HISTORY FROM: patient  REASON FOR VISIT: new consult    HISTORICAL  CHIEF COMPLAINT:  Chief Complaint  Patient presents with  . Migraine    rm 6 New Pt "hx of migraines and CT scans, progressed with pregnancy; daily headache, some worse than others; Tylenol, Aleve, Ibuprofen not helpful    HISTORY OF PRESENT ILLNESS:   33 year old female G3P1011 here for evaluation of headaches.  Patient is currently [redacted] weeks pregnant.  Patient has history of migraine headache starting at age 49 years old consisting of right sided and right eye pain, throbbing sensation, nausea, photophobia, phonophobia.  No specific triggering factors identified in the past.  She used over-the-counter medications as needed in the past.  Patient has had slight worsening of headaches with her other 2 pregnancies.  With current pregnancy patient having increasing headaches almost on daily basis.  They feel similar to her prior migraine headaches.  Patient is working, in school, lives with boyfriend and 13 year old daughter at home.  Stress levels are higher and patient has been sleeping less.  Patient is used some Tylenol without relief.   REVIEW OF SYSTEMS: Full 14 system review of systems performed and negative with exception of: As per HPI.  ALLERGIES: Allergies  Allergen Reactions  . Penicillin G Hives    HOME MEDICATIONS: Outpatient Medications Prior to Visit  Medication Sig Dispense Refill  . ferrous sulfate 325 (65 FE) MG tablet Take 1 tablet (325 mg total) by mouth 2 (two) times daily with a meal for 30 days. 60 tablet 0  . Prenatal Vit-Fe Fumarate-FA (MULTIVITAMIN-PRENATAL) 27-0.8 MG TABS tablet Take 1 tablet by mouth daily at 12 noon.    . Blood Pressure Monitoring (BLOOD PRESSURE KIT) DEVI 1 kit by Does not apply route once a week.  Check BP regularly and record readings into the [employer] App. Large Cuff. DX O90.0 (Patient not taking: Reported on 10/08/2019) 1 each 0  . docusate sodium (COLACE) 100 MG capsule Take 1 capsule (100 mg total) by mouth 2 (two) times daily as needed. (Patient not taking: Reported on 12/19/2018) 30 capsule 2  . Elastic Bandages & Supports (COMFORT FIT MATERNITY SUPP LG) MISC 1 Units by Does not apply route daily as needed. (Patient not taking: Reported on 10/08/2019) 1 each 0  . ondansetron (ZOFRAN ODT) 4 MG disintegrating tablet Take 1 tablet (4 mg total) by mouth every 8 (eight) hours as needed for nausea or vomiting. (Patient not taking: Reported on 11/23/2018) 15 tablet 0   No facility-administered medications prior to visit.    PAST MEDICAL HISTORY: Past Medical History:  Diagnosis Date  . Arthritis    knee  . Knee pain   . Migraines     PAST SURGICAL HISTORY: Past Surgical History:  Procedure Laterality Date  . CESAREAN SECTION  2008  . DILATION AND EVACUATION N/A 11/26/2018   Procedure: DILATATION AND EVACUATION;  Surgeon: Aletha Halim, MD;  Location: [facility];  Service: Gynecology;  Laterality: N/A;  . WISDOM TOOTH EXTRACTION      FAMILY HISTORY: Family History  Problem Relation Age of Onset  . Diabetes Mother   . Diabetes Maternal Aunt   . Hearing loss Maternal Aunt   . Heart disease Maternal Aunt   . Hypertension Maternal Aunt   . Stroke Maternal Aunt   . Alcohol abuse Maternal Uncle   .  Diabetes Maternal Uncle   . Drug abuse Maternal Uncle   . Hyperlipidemia Maternal Uncle   . Hypertension Maternal Uncle   . Heart disease Maternal Uncle   . Cancer Maternal Grandmother   . Diabetes Maternal Grandmother   . COPD Maternal Grandfather     SOCIAL HISTORY: Social History   Socioeconomic History  . Marital status: Single    Spouse name: Not on file  . Number of children: 1  . Years of education: Not on file  . Highest education level: Some college, no degree    Occupational History  . Not on file  Tobacco Use  . Smoking status: Never Smoker  . Smokeless tobacco: Never Used  Substance and Sexual Activity  . Alcohol use: Not Currently    Alcohol/week: 0.0 standard drinks    Comment: 0-1  . Drug use: No  . Sexual activity: Yes  Other Topics Concern  . Not on file  Social History Narrative   Lives with cild   Caffeine- rare   Social Determinants of Health   Financial Resource Strain:   . Difficulty of Paying Living Expenses: Not on file  Food Insecurity:   . Worried About Charity fundraiser in the Last Year: Not on file  . Ran Out of Food in the Last Year: Not on file  Transportation Needs:   . Lack of Transportation (Medical): Not on file  . Lack of Transportation (Non-Medical): Not on file  Physical Activity:   . Days of Exercise per Week: Not on file  . Minutes of Exercise per Session: Not on file  Stress:   . Feeling of Stress : Not on file  Social Connections:   . Frequency of Communication with Friends and Family: Not on file  . Frequency of Social Gatherings with Friends and Family: Not on file  . Attends Religious Services: Not on file  . Active Member of Clubs or Organizations: Not on file  . Attends Archivist Meetings: Not on file  . Marital Status: Not on file  Intimate Partner Violence:   . Fear of Current or Ex-Partner: Not on file  . Emotionally Abused: Not on file  . Physically Abused: Not on file  . Sexually Abused: Not on file     PHYSICAL EXAM  GENERAL EXAM/CONSTITUTIONAL: Vitals:  Vitals:   10/08/19 1344  BP: (!) 106/58  Pulse: 77  Temp: 97.8 F (36.6 C)  Weight: 178 lb 3.2 oz (80.8 kg)  Height: _0  (1.549 m)     Body mass index is 33.67 kg/m. Wt Readings from Last 3 Encounters:  10/08/19 178 lb 3.2 oz (80.8 kg)  09/05/19 168 lb (76.2 kg)  07/31/19 163 lb (73.9 kg)     Patient is in no distress; well developed, nourished and groomed; neck is  supple  CARDIOVASCULAR:  Examination of carotid arteries is normal; no carotid bruits  Regular rate and rhythm, no murmurs  Examination of peripheral vascular system by observation and palpation is normal  EYES:  Ophthalmoscopic exam of optic discs and posterior segments is normal; no papilledema or hemorrhages  No exam data present  MUSCULOSKELETAL:  Gait, strength, tone, movements noted in Neurologic exam below  NEUROLOGIC: MENTAL STATUS:  No flowsheet data found.  awake, alert, oriented to person, place and time  recent and remote memory intact  normal attention and concentration  language fluent, comprehension intact, naming intact  fund of knowledge appropriate  CRANIAL NERVE:   2nd - no papilledema  on fundoscopic exam  2nd, 3rd, 4th, 6th - pupils equal and reactive to light, visual fields full to confrontation, extraocular muscles intact, no nystagmus  5th - facial sensation symmetric  7th - facial strength symmetric  8th - hearing intact  9th - palate elevates symmetrically, uvula midline  11th - shoulder shrug symmetric  12th - tongue protrusion midline  MOTOR:   normal bulk and tone, full strength in the BUE, BLE  SENSORY:   normal and symmetric to light touch, temperature, vibration  COORDINATION:   finger-nose-finger, fine finger movements normal  REFLEXES:   deep tendon reflexes present and symmetric  GAIT/STATION:   narrow based gait     DIAGNOSTIC DATA (LABS, IMAGING, TESTING) - I reviewed patient records, labs, notes, testing and imaging myself where available.  Lab Results  Component Value Date   WBC 9.3 09/05/2019   HGB 12.2 09/05/2019   HCT 36.5 09/05/2019   MCV 86 09/05/2019   PLT 227 09/05/2019      Component Value Date/Time   NA 137 11/23/2018 1218   K 3.8 11/23/2018 1218   CL 104 11/23/2018 1218   CO2 25 11/23/2018 1218   GLUCOSE 147 (H) 11/23/2018 1218   BUN <5 (L) 11/23/2018 1218   CREATININE 0.59  11/23/2018 1218   CALCIUM 8.9 11/23/2018 1218   PROT 6.5 12/04/2016 1234   ALBUMIN 4.1 12/04/2016 1234   AST 14 12/04/2016 1234   ALT 11 12/04/2016 1234   ALKPHOS 45 12/04/2016 1234   BILITOT 0.4 12/04/2016 1234   GFRNONAA >60 11/23/2018 1218   GFRAA >60 11/23/2018 1218   Lab Results  Component Value Date   CHOL 129 12/04/2016   HDL 51.40 12/04/2016   LDLCALC 67 12/04/2016   TRIG 51.0 12/04/2016   CHOLHDL 3 12/04/2016   No results found for: HGBA1C No results found for: VITAMINB12 Lab Results  Component Value Date   TSH 0.45 12/04/2016    12/01/09 CT head (without)  1. Negative for bleed or other acute intracranial process. 2. Early bilateral basal ganglia mineralization for age, which is nonspecific and can be associated with abnormal calcium / phosphate metabolism and Fahr's disease.    ASSESSMENT AND PLAN  33 y.o. year old female here with history of migraine headache since age 47 years old, now [redacted] weeks pregnant, G3P1, with slightly increased headaches since pregnancy onset.  Headaches are similar characteristics, and neurologic exam is unremarkable.    Dx:  1. Pregnancy headache in first trimester      PLAN:  - Discussed options for nonpharmacologic treatment of headaches, safety of medications during pregnancy and other options for the duration of pregnancy.  Patient was reassured and will try to manage using Tylenol and conservative treatment.  I have reviewed other possible treatments for headache in pregnancy, referencing uptodate review article below.   MIGRAINE DURING PREGNANCY  To prevent or relieve headaches, try the following:  Cool Compress. Lie down and place a cool compress on your head.   Avoid headache triggers. If certain foods or odors seem to have triggered your migraines in the past, avoid them. A headache diary might help you identify triggers.   Include physical activity in your daily routine.   Manage stress. Find healthy ways to cope  with the stressors, such as delegating tasks on your to-do list.   Practice relaxation techniques. Try deep breathing, yoga, massage and visualization.   Eat regularly. Eating regularly scheduled meals and maintaining a healthy diet might help prevent  headaches. Also, drink plenty of fluids.   Follow a regular sleep schedule. Sleep deprivation might contribute to headaches  Consider biofeedback. With this mind-body technique, you learn to control certain bodily functions -- such as muscle tension, heart rate and blood pressure -- to prevent headaches or reduce headache pain.  May consider the following in the future: http://carter.biz/ ACUTE MIGRAINE TREATMENTS "Most women with migraine headache report improvement during pregnancy. For women who request drug therapy, we suggest acetaminophen as first-line acute (abortive) therapy (Grade 2C). For women who do not respond to acetaminophen alone, we suggest acetaminophen combination therapy (Grade 2C). Options include acetaminophen (650 to 1000 mg) and metoclopramide (10 mg); acetaminophen-codeine (30 to 60 mg); or acetaminophen (325 mg), caffeine (40 mg), and butalbital (50 mg). If unsuccessful, our second-line approach is a nonsteroidal anti-inflammatory drug for <48 hours, followed by third-line options, beginning with an opioid and then moving to sumatriptan if the opioid is unsuccessful or needed frequently. (See 'Acute migraine treatment' above.)"  MIGRAINE PREVENTION "?Short- and long-acting calcium channel blockers are commonly used for treatment of hypertension and preterm labor without adverse fetal/pregnancy effects. An increase in congenital anomalies has not been reported in humans, although information is limited [31]. Verapamil is the preferred agent because it is relatively safe and has good tolerability and ease of use. ?Cyproheptadine is an older antihistamine agent  sometimes used as a preventive therapy and does not appear to have adverse pregnancy effects."   Return for pending if symptoms worsen or fail to improve.    Penni Bombard, MD 84/21/0312, 8:11 PM Certified in Neurology, Neurophysiology and Neuroimaging  [facility] [address], [place], [place] [address] [phone]

## 2019-10-08 NOTE — Patient Instructions (Signed)
MIGRAINE DURING PREGNANCY  To prevent or relieve headaches, try the following:  Cool Compress. Lie down and place a cool compress on your head.   Avoid headache triggers. If certain foods or odors seem to have triggered your migraines in the past, avoid them. A headache diary might help you identify triggers.   Include physical activity in your daily routine.   Manage stress. Find healthy ways to cope with the stressors, such as delegating tasks on your to-do list.   Practice relaxation techniques. Try deep breathing, yoga, massage and visualization.   Eat regularly. Eating regularly scheduled meals and maintaining a healthy diet might help prevent headaches. Also, drink plenty of fluids.   Follow a regular sleep schedule. Sleep deprivation might contribute to headaches  Consider biofeedback. With this mind-body technique, you learn to control certain bodily functions -- such as muscle tension, heart rate and blood pressure -- to prevent headaches or reduce headache pain.

## 2019-10-09 ENCOUNTER — Other Ambulatory Visit: Payer: Self-pay | Admitting: *Deleted

## 2019-10-09 ENCOUNTER — Encounter: Payer: Self-pay | Admitting: Diagnostic Neuroimaging

## 2019-10-09 NOTE — Telephone Encounter (Signed)
Received a fax from [facility] requesting refill for Ferrous Sulfate 325mg . Per chart review is a [facility] patient - will forward to that office.  Irania Durell,RN

## 2019-10-10 MED ORDER — FERROUS SULFATE 325 (65 FE) MG PO TABS: 325.0000 mg | ORAL_TABLET | Freq: Two times a day (BID) | ORAL | 0 refills | Status: AC

## 2019-10-10 NOTE — Telephone Encounter (Signed)
Refill request from pharmacy for Iron.    Please send if approved

## 2019-10-31 ENCOUNTER — Encounter: Payer: Self-pay | Admitting: Medical

## 2019-10-31 ENCOUNTER — Ambulatory Visit (HOSPITAL_COMMUNITY)
Admission: RE | Admit: 2019-10-31 | Discharge: 2019-10-31 | Disposition: A | Discharge status: Home / Self Care | Payer: BC Managed Care – PPO | Source: Ambulatory Visit | Attending: Obstetrics and Gynecology | Admitting: Obstetrics and Gynecology

## 2019-10-31 ENCOUNTER — Other Ambulatory Visit (HOSPITAL_COMMUNITY): Payer: Self-pay | Admitting: *Deleted

## 2019-10-31 ENCOUNTER — Other Ambulatory Visit: Payer: Self-pay

## 2019-10-31 ENCOUNTER — Ambulatory Visit (INDEPENDENT_AMBULATORY_CARE_PROVIDER_SITE_OTHER): Payer: BC Managed Care – PPO | Admitting: Medical

## 2019-10-31 VITALS — BP 116/73 | HR 89 | Wt 180.9 lb

## 2019-10-31 DIAGNOSIS — Z3A19 19 weeks gestation of pregnancy: Secondary | ICD-10-CM

## 2019-10-31 DIAGNOSIS — O99212 Obesity complicating pregnancy, second trimester: Secondary | ICD-10-CM

## 2019-10-31 DIAGNOSIS — Z3401 Encounter for supervision of normal first pregnancy, first trimester: Secondary | ICD-10-CM | POA: Insufficient documentation

## 2019-10-31 DIAGNOSIS — Z3482 Encounter for supervision of other normal pregnancy, second trimester: Secondary | ICD-10-CM

## 2019-10-31 DIAGNOSIS — Z362 Encounter for other antenatal screening follow-up: Secondary | ICD-10-CM

## 2019-10-31 DIAGNOSIS — O3482 Maternal care for other abnormalities of pelvic organs, second trimester: Secondary | ICD-10-CM

## 2019-10-31 DIAGNOSIS — O99210 Obesity complicating pregnancy, unspecified trimester: Secondary | ICD-10-CM | POA: Insufficient documentation

## 2019-10-31 DIAGNOSIS — O34219 Maternal care for unspecified type scar from previous cesarean delivery: Secondary | ICD-10-CM | POA: Diagnosis not present

## 2019-10-31 DIAGNOSIS — D279 Benign neoplasm of unspecified ovary: Secondary | ICD-10-CM

## 2019-10-31 NOTE — Progress Notes (Signed)
   PRENATAL VISIT NOTE  Subjective:  Judy James is a 34 y.o. G3P1011 at [redacted]w[redacted]d being seen today for ongoing prenatal care.  She is currently monitored for the following issues for this high-risk pregnancy and has Migraines; Ovarian dermoid cyst complicating pregnancy, antepartum, unspecified trimester; Encounter for supervision of normal first pregnancy in first trimester; History of cesarean delivery, currently pregnant; and Obesity affecting pregnancy on their problem list.  Patient reports no complaints.  Contractions: Not present. Vag. Bleeding: None.  Movement: Present. Denies leaking of fluid.   The following portions of the patient's history were reviewed and updated as appropriate: allergies, current medications, past family history, past medical history, past social history, past surgical history and problem list.   Objective:   Vitals:   10/31/19 0857  BP: 116/73  Pulse: 89  Weight: 180 lb 14.4 oz (82.1 kg)    Fetal Status: Fetal Heart Rate (bpm): 148   Movement: Present     General:  Alert, oriented and cooperative. Patient is in no acute distress.  Skin: Skin is warm and dry. No rash noted.   Cardiovascular: Normal heart rate noted  Respiratory: Normal respiratory effort, no problems with respiration noted  Abdomen: Soft, gravid, appropriate for gestational age.  Pain/Pressure: Absent     Pelvic: Cervical exam deferred        Extremities: Normal range of motion.  Edema: Trace  Mental Status: Normal mood and affect. Normal behavior. Normal judgment and thought content.   Assessment and Plan:  Pregnancy: G3P1011 at [redacted]w[redacted]d 1. Encounter for supervision of other normal pregnancy in second trimester - AFP only (15.0-22.6) - Declined flu vaccine today  - Peds list given - Anatomy US is today  2. History of cesarean delivery, currently pregnant - Planning TOLAC, will sign consent at 28 weeks   3. Obesity affecting pregnancy in second trimester - Advised to start ASA  daily, patient agrees   Preterm labor/ second trimester warning symptoms and general obstetric precautions including but not limited to vaginal bleeding, contractions, leaking of fluid and fetal movement were reviewed in detail with the patient. Please refer to After Visit Summary for other counseling recommendations.   Return in about 4 weeks (around 11/28/2019) for LOB, Virtual.  Future Appointments  Date Time Provider [place]  11/28/2019 10:45 AM Ephraim Hamburger, Rona Ravens, NP [facility] None    Kerry Hough, PA-C

## 2019-10-31 NOTE — Patient Instructions (Addendum)
Take 1 Baby Aspirin (81mg ) daily until delivery    [place] [phone]) . [facility] Davy Pique, MD; Gwendlyn Deutscher, MD; Walker Kehr, MD; Andria Frames, MD; McDiarmid, MD; Dutch Quint, MD; Nori Riis, MD; [place], [address] [place], [place] [address] o [phone] o Mon-Fri 8:30-12:30, 1:30-5:00 o Providers come to see babies at [facility] o Accepting Medicaid . [place] [facility] [place] providers who accept newborns: Dorthy Cooler, MD; Orland Mustard, MD; Stephanie Acre, MD o [place], [place], [place] [address] o [phone] o Mon-Fri 8:00-5:30 o Babies seen by providers at [facility] o Does NOT accept Medicaid o Please call early in hospitalization for appointment (limited availability)  . Mustard Chisholm, MD o [address] [place], [place] [address] o [phone] o Mon, Tue, Thur, Fri 8:30-5:00, Wed 10:00-7:00 (closed 1-2pm) o Babies seen by [facility] providers o Accepting Medicaid . [place], MD o [place], [place], [place] [address] o [phone] o Mon-Fri 8:30-5:00, Sat 8:30-12:00 o Provider comes to see babies at [place] Medicaid o Must have been referred from current patients or contacted office prior to delivery . [facility] ([place] for [place]) Franne Forts, MD; Tamera Punt, MD; Doneen Poisson, MD; Fatima Sanger, MD; Wynetta Emery, MD; Jess Barters, MD; Tami Ribas, MD; Herbert Moors, MD; Derrell Lolling, MD; Dorothyann Peng, MD; Lucious Groves, NP; Baldo Ash, NP o [address], [place], [place] [address] o [phone] o Mon, Tue, Thur, Fri 8:30-5:30, Wed 9:30-5:30, Sat 8:30-12:30 o Babies seen by [facility] providers o Accepting Medicaid o Only accepting infants of first-time parents or siblings of current patients [facility] discharge coordinator will make  follow-up appointment . Baltazar Najjar o [place] [address], [place], [place]  [address] o [phone]   Fax - [phone] . [facility] o [id number] [address], [place], [place]  [address] o Phone - [phone]   Fax [phone] . [place], [place], [place], [place]  [address] o [phone]  [place] [phone]) . [facility] Reginal Lutes, MD; Jacklynn Ganong, MD; Torrie Mayers, MD; MD; Rosana Hoes, MD; Servando Salina, MD; Rose Fillers, MD; Rex Kras, MD; Corinna Capra, MD; Volney American, MD; Trilby Drummer, MD; Janann Colonel, MD; Jimmye Norman, [place], [place], [place] [address] o [phone] o Mon-Fri 8:30-5:00 (extended evenings Mon-Thur as needed), Sat-Sun 10:00-1:00 o Providers come to see babies at [place] Medicaid for families of first-time babies and families with all children in the household age 49 and under. Must register with office prior to making appointment (M-F only). . [place], NP; Tomi Bamberger, MD; Redmond School, MD; [place], [place] [address] [place],  [address] o [phone] o Mon-Fri 8:00-5:00 o Babies seen by providers at [facility] o Does NOT accept Medicaid/Commercial Insurance Only . [facility] [place] [facility])  Marnee Guarneri, MD; Drema Dallas, MD; Montine Circle, MD; Vilma Prader, MD; [name], MD; Alfonso Ramus, MD; Ruthann Cancer, MD; Roxanne Mins, MD; Rosalva Ferron, MD; Polly Cobia, MD o [place]., [place],  [address] o [phone] o Mon-Fri 8:30-5:30, Sat (Oct.-Mar.) 9:00-1:00 o Babies seen by providers at [place] [phone]) . [facility] Peers, MD; Suzan Slick, MD o [address], [place],  [address] o [phone] o Mon-Fri 8:30-5:00, Sat 8:30-12:00 o Providers come to see babies at [facility] o Does NOT accept Medicaid . [place], [place]; [place], MD; [place],  [place]; Nancy Fetter, MD; Moreen Fowler, [place], [place], [place] [address] o [phone] o Mon-Fri 8:00-5:00 o Babies seen by providers at [facility] o Does NOT accept Medicaid o Only accepting babies of parents who are patients o Please call early in hospitalization for appointment (limited availability) . [facility] Blanca Friend, MD; Sharlene Motts, MD; Rod Can, MD; Warner Mccreedy, NP; Sabra Heck, MD; Ermalinda Memos, MD; Sharlett Iles, NP; Aurther Loft, MD; Jerrye Beavers, MD; Marcello Moores, MD; Berline Lopes, MD; Charolette Forward, MD o [place]. [place], [place], [place] [address] o [phone] o Mon-Fri 8:00-5:00, Sat 9:00-12:00 o Providers come to see babies at [facility] o Does NOT accept [facility] [phone]) . [place] [facility] [place] providers accepting new patients: Dayna Ramus, NP; [place], [place], [place], [place] [address] o [phone] o Mon-Fri 8:00-5:00 o Babies seen by providers at [facility] o Does NOT accept Medicaid o Only accepting babies of parents who are patients o Please call early in hospitalization for appointment (limited availability) . [facility] Oswaldo Conroy, MD; Sheran Lawless, MD o [address] [place], [place] [address] o [phone] (press 1 to schedule appointment) o Mon-Fri 8:00-5:00 o Providers come to see babies at [facility] o Does NOT accept Medicaid . [facility] Jodi Mourning, MD o [address] [place], [place] [address] o [phone] o Mon-Fri 8:30-5:00 (lunch 12:30-1:00), extended hours by appointment only Wed 5:00-6:30 o Babies seen by [facility] providers o Accepting Medicaid . [place] at Evalyn Casco, MD; [place], MD; Ethlyn Gallery, MD o [place], [place], [place] [address] o [phone] o Mon-Fri 8:00-5:00 o Babies seen by [facility] providers o Does NOT accept Medicaid . Therapist, music at [place], MD; Yong Channel, MD; [place],  [address] [place], [place] [address] o [phone] o Mon-Fri 8:00-5:00 o Babies seen by [facility] providers o Does NOT accept Medicaid . [place], [place]; [place], [place]; [place], NP; Albertina Parr, MD; Frederic Jericho, MD; Ronney Lion, MD; Carlos Levering, NP; Jerelene Redden, NP; Tomasita Crumble, NP; Ronelle Nigh, NP; Corinna Lines, MD; [place], MD o [place]., [place], [place] [address] o [phone] o Mon-Fri 8:30-5:00, Sat 10:00-1:00 o Providers come to see babies at [facility] o Does NOT accept Medicaid o Free prenatal information session Tuesdays at 4:45pm . [facility] Porfirio Oar, MD; [place], [place]; [place], [place]; [place], [place]., [place] [address] o [phone] o Mon-Fri 7:30-5:30 o Babies seen by [facility] providers . [facility]'s Doctor o [address] [place], [place], [place]  [address] o [phone]   Fax - [phone]  [place] [phone] & [phone]) . [place], MD o [address] [place], [place] [address] o [phone] o Mon-Thur 8:00-6:00 o Providers come to see babies at [place] Medicaid . [place], NP; Melford Aase, MD; [place], [place]; [place], [address]., [place], [place] [address] o [phone] o Mon-Thur 7:30-7:30, Fri 7:30-4:30 o Babies seen by [facility] providers o Accepting Medicaid . [facility] Nyra Jabs, MD; Cristino Martes, NP; Gertie Baron, MD o [place] [place] [address], [place], [place] [address] o [phone] o Mon-Fri 8:30-5:00, Sat 8:30-12:00 o Providers come to see babies at [place] Medicaid o Must have "Meet & Greet" appointment at office prior to delivery .  [place] ([place]) Jodene Nam, MD; Juleen China, MD; Clydene Laming, [place] [address], [place], [place] [address] o [phone] o Mon-Wed 8:00-6:00, Thur-Fri  8:00-5:00, Sat 9:00-12:00 o Providers come to see babies at [facility] o Does NOT accept Medicaid o Only accepting siblings of current patients . [facility] [place], [place], [place], [place]  [address] o [phone]   Fax [phone] . [place] [facility] [place] [address] [place], [place]  [address] o [phone]   Fax - [place] [phone] & [phone]) . Therapist, music at [place], DO; [place], [place]., [place], [place] [address] o [phone] o Mon-Fri 7:00-5:00 o Babies seen by [facility] providers o Does NOT accept Medicaid . [place], MD; [place], [place]; [place], [place], [place], [place] [address] o [phone] o Mon-Fri 8:00-5:00 o Babies seen by [facility] providers o Accepting Medicaid . [place], MD; [place], [place]; [place], NP; [place], [place], [place], [place] [address] o [phone] o Mon-Fri 8:00-5:00 o Babies seen by providers at [place] [phone]) . [facility] [place], [place] o [place]., [place], [place] [address] o [phone] o Mon-Fri 8:00-5:00 o Babies seen by [facility] providers o Does NOT accept Medicaid o Limited availability, please call early in hospitalization to schedule follow-up . [facility] Leilani Merl, PA; Maisie Fus, MD; [place], MD; [place], [place]; Jeannine Kitten, MD; [place], [place] [facility] [address], [place], [place] [address] o [phone] o Mon-Fri 8:30-5:00, Sat 9:00-12:00 o Babies seen by providers at [facility] o Accepting Medicaid o Please register online then schedule online or call office o [email or web address] . [place] ([place] [facility] [place]) Kristian Covey, NP; Dwyane Dee, MD; Leonidas Romberg, PA o [address] [place], [place], [place] [address] o [phone] o Mon-Fri 8:00-5:00 o Babies seen by providers at [facility] o Accepting Medicaid . [place] ([facility] [employer]) Dairl Ponder, MD; Rayvon Char, NP; Melina Modena, MD o [address] Ramos, [place], [place] [address] o [phone] o Mon-Fri 8:00-5:30, Sat&Sun by appointment (phones open at 8:30) o Babies seen by [facility] providers o Accepting Medicaid o Must be a first-time baby or sibling of current patient . [place], [id number], [place], [place]  [address] o [phone]   Fax - [phone]  [place] [phone] & [phone]) . [place], [place]; [place], [place]; Benjamine Mola, MD; [place], [place]; Harrell Lark, MD o [address] [place], [place] [address] o [phone] o Mon-Thur 8:00-7:00, Fri 8:00-5:00, Sat 8:00-12:00, Sun 9:00-12:00 o Babies seen by [facility] providers o Accepting Medicaid . [facility], MD; Ruthann Cancer, MD; [facility], MD o [address], [place],  [address] o [phone] o Mon-Thur 8:00-5:00 o Babies seen by providers at [facility] o Accepting Medicaid . [facility] [place], MD; Coe[place], MD; Amedeo Plenty, MD; Bobby Rumpf, MD; List, MD; [place] Drafts, MD; Ruthann Cancer, MD; Selinda Eon, MD; Audie Box, MD; Jim Like, MD; Christie Nottingham, MD; [place],  MD; [place]ena Nunnery, MD o [address] [place], [place] [address] o [phone] o Mon-Fri 8:00-5:30, Sat (Oct.-Mar.) 9:00-1:00 o Babies seen by providers at [facility] o Accepting Medicaid o Must fill out new patient packet, available online at [email or web address] . [place] ([facility]) Barnabas Lister,  NP; Kenton Kingfisher, NP; Claiborne Billings, NP; Rolla Plate, MD; [place], [place]; [place] Rhine, MD; [place], MD; Delia Chimes, NP o [address], [place], [place] [address] o [phone] o Mon-Thur 8:00-5:30, Fri 8:00-5:00 o Babies seen by providers at [place] [phone]) . [place], [place]; [place], MD; Dennard Schaumann, MD; [place], [place] o [address] [place], [place] [address] o [phone] o Mon-Fri 8:00-5:00 o Babies seen by providers at [place] [phone]) . [place] at [place], [place]; Olen Pel, MD; [place], [place], [place], [place] [address] o [phone] o Mon-Fri 8:00-5:00 o Babies seen by providers at [facility] o Does NOT accept Medicaid o Limited appointment availability, please call early in hospitalization  . Therapist, music at [place], [place]; [place], [place] [address], [place], [place] [address] o [phone] o Mon-Fri 8:00-5:00 o Babies seen by [facility] providers o Does NOT accept Medicaid . [facility] Su Grand, MD; Guy Sandifer, MD; [place], [place]; [place], [place] [address], [place], [place] [address] o [phone] o Mon-Fri 8:00-5:00 o After hours clinic [facility] [place], [place] [address]) [phone] Mon-Fri 5:00-8:00, Sat 12:00-6:00, Sun 10:00-4:00 o Babies seen by [facility] providers o Accepting Medicaid . [place] at [facility] o [address] [place], [place]  [address] o [phone]   Fax - [phone]  [place] [phone]) . Therapist, music at [facility], MD o [address] [place], [place], [place] [address] o [phone] o Mon-Fri 8:00-5:00 o Babies seen by [facility] providers o Does NOT accept Medicaid . [place] ([place] [facility] [place]) Bing Neighbors, MD o [address]  [place], [place],  [address] o [phone] o Mon-Thur 8:00-7:00, Fri 8:00-5:00, Sat 8:00-12:00 o Babies seen by providers at [facility] o Accepting Medicaid - but does not have vaccinations in office (must be received elsewhere) o Limited availability, please call early in hospitalization  [place] ([id number]) . [place], [place] [address], [place] [address] o [phone]  Fax [phone]

## 2019-10-31 NOTE — Progress Notes (Signed)
Pt is here for ROB. [redacted]w[redacted]d. Pt has c/o tingling in her hands at night.

## 2019-11-02 LAB — AFP, SERUM, OPEN SPINA BIFIDA
AFP MoM: 1.27
AFP Value: 61.9 ng/mL
Gest. Age on Collection Date: 19 weeks
Maternal Age At EDD: 31.4 yr
OSBR Risk 1 IN: 10000
Test Results:: NEGATIVE
Weight: 180 [lb_av]

## 2019-11-19 ENCOUNTER — Other Ambulatory Visit: Payer: Self-pay

## 2019-11-19 ENCOUNTER — Encounter (HOSPITAL_COMMUNITY): Payer: Self-pay | Admitting: Emergency Medicine

## 2019-11-19 ENCOUNTER — Inpatient Hospital Stay (HOSPITAL_COMMUNITY)
Admission: EM | Admit: 2019-11-19 | Discharge: 2019-11-20 | Disposition: A | Discharge status: Home / Self Care | Payer: BC Managed Care – PPO | Attending: Emergency Medicine | Admitting: Emergency Medicine

## 2019-11-19 DIAGNOSIS — O26892 Other specified pregnancy related conditions, second trimester: Secondary | ICD-10-CM | POA: Diagnosis not present

## 2019-11-19 DIAGNOSIS — R109 Unspecified abdominal pain: Secondary | ICD-10-CM

## 2019-11-19 DIAGNOSIS — R1084 Generalized abdominal pain: Secondary | ICD-10-CM | POA: Insufficient documentation

## 2019-11-19 DIAGNOSIS — Z3A22 22 weeks gestation of pregnancy: Secondary | ICD-10-CM | POA: Diagnosis not present

## 2019-11-19 DIAGNOSIS — R1011 Right upper quadrant pain: Secondary | ICD-10-CM

## 2019-11-19 DIAGNOSIS — Z3A21 21 weeks gestation of pregnancy: Secondary | ICD-10-CM | POA: Diagnosis not present

## 2019-11-19 DIAGNOSIS — R140 Abdominal distension (gaseous): Secondary | ICD-10-CM

## 2019-11-19 MED ORDER — SODIUM CHLORIDE 0.9% FLUSH: 3.0000 mL | Freq: Once | INTRAVENOUS | Status: DC

## 2019-11-19 NOTE — ED Triage Notes (Addendum)
Patient is 5 1/2 months pregnant G3P1 reports right abdominal cramping onset this morning , denies emesis or diarrhea , no vaginal bleeding or PROM , mild whitish vaginal discharge , denies fever or chills . Patient medically screened by PA at triage cleared to be transferred to MAU. Report given to MAU RN , transporter notified .

## 2019-11-20 ENCOUNTER — Inpatient Hospital Stay (HOSPITAL_COMMUNITY): Payer: BC Managed Care – PPO

## 2019-11-20 ENCOUNTER — Other Ambulatory Visit: Payer: Self-pay

## 2019-11-20 ENCOUNTER — Encounter (HOSPITAL_COMMUNITY): Payer: Self-pay | Admitting: Obstetrics & Gynecology

## 2019-11-20 DIAGNOSIS — Z3A21 21 weeks gestation of pregnancy: Secondary | ICD-10-CM | POA: Diagnosis not present

## 2019-11-20 DIAGNOSIS — O26892 Other specified pregnancy related conditions, second trimester: Secondary | ICD-10-CM

## 2019-11-20 DIAGNOSIS — R1011 Right upper quadrant pain: Secondary | ICD-10-CM | POA: Diagnosis not present

## 2019-11-20 DIAGNOSIS — R109 Unspecified abdominal pain: Secondary | ICD-10-CM

## 2019-11-20 LAB — COMPREHENSIVE METABOLIC PANEL
ALT: 39 U/L (ref 0–44)
AST: 28 U/L (ref 15–41)
Albumin: 2.9 g/dL — ABNORMAL LOW (ref 3.5–5.0)
Alkaline Phosphatase: 41 U/L (ref 38–126)
Anion gap: 8 (ref 5–15)
BUN: <5 mg/dL — ABNORMAL LOW (ref 6–20)
CO2: 22 mmol/L (ref 22–32)
Calcium: 8.7 mg/dL — ABNORMAL LOW (ref 8.9–10.3)
Chloride: 107 mmol/L (ref 98–111)
Creatinine, Ser: 0.59 mg/dL (ref 0.44–1.00)
GFR calc Af Amer: >60 mL/min (ref >60)
GFR calc non Af Amer: >60 mL/min (ref >60)
Glucose, Bld: 103 mg/dL — ABNORMAL HIGH (ref 70–99)
Potassium: 4 mmol/L (ref 3.5–5.1)
Sodium: 137 mmol/L (ref 135–145)
Total Bilirubin: 0.3 mg/dL (ref 0.3–1.2)
Total Protein: 5.7 g/dL — ABNORMAL LOW (ref 6.5–8.1)

## 2019-11-20 LAB — CBC
HCT: 35.2 % — ABNORMAL LOW (ref 36.0–46.0)
Hemoglobin: 12.1 g/dL (ref 12.0–15.0)
MCH: 30.9 pg (ref 26.0–34.0)
MCHC: 34.4 g/dL (ref 30.0–36.0)
MCV: 90 fL (ref 80.0–100.0)
Platelets: 180 10*3/uL (ref 150–400)
RBC: 3.91 MIL/uL (ref 3.87–5.11)
RDW: 14.4 % (ref 11.5–15.5)
WBC: 12 10*3/uL — ABNORMAL HIGH (ref 4.0–10.5)
nRBC: 0 % (ref 0.0–0.2)

## 2019-11-20 LAB — URINALYSIS, ROUTINE W REFLEX MICROSCOPIC
Bilirubin Urine: NEGATIVE
Glucose, UA: 50 mg/dL — AB
Hgb urine dipstick: NEGATIVE
Ketones, ur: NEGATIVE mg/dL
Leukocytes,Ua: NEGATIVE
Nitrite: NEGATIVE
Protein, ur: NEGATIVE mg/dL
Specific Gravity, Urine: 1.011 (ref 1.005–1.030)
pH: 7 (ref 5.0–8.0)

## 2019-11-20 LAB — LIPASE, BLOOD: Lipase: 22 U/L (ref 11–51)

## 2019-11-20 MED ORDER — HYOSCYAMINE SULFATE 0.125 MG SL SUBL
0.1250 mg | SUBLINGUAL_TABLET | Freq: Once | SUBLINGUAL | Status: AC
  Administered 2019-11-20: 0.125 mg via SUBLINGUAL
  Filled 2019-11-20: qty 1

## 2019-11-20 MED ORDER — SIMETHICONE 80 MG PO CHEW
80.0000 mg | CHEWABLE_TABLET | Freq: Once | ORAL | Status: AC
  Administered 2019-11-20: 02:00:00 80 mg via ORAL
  Filled 2019-11-20: qty 1

## 2019-11-20 MED ORDER — SIMETHICONE 80 MG PO CHEW: 80.0000 mg | CHEWABLE_TABLET | Freq: Four times a day (QID) | ORAL | 0 refills | Status: AC | PRN

## 2019-11-20 NOTE — Progress Notes (Signed)
Marie Williams CNM in to discuss test results and d/c plan. Written and verbal d/c instructions given and understanding voiced.  

## 2019-11-20 NOTE — ED Provider Notes (Signed)
MSE was initiated and I personally evaluated the patient and placed orders (if any) at  12:04 AM on November 20, 2019.  The patient appears stable so that the remainder of the MSE may be completed by another provider.  Judy James is a 34 y.o. female G3P1 at 3 weeks who presents to the emergency department with right-sided abdominal cramping onset this morning which has been persistent.  Patient denies rupture of membranes or vaginal bleeding.  She denies emesis or diarrhea, fevers or chills.  She reports good fetal movement.  She took 2 Tylenol without relief of her pain.  BP 113/62   Pulse 90   Temp 98.5 F (36.9 C)   Resp 18   LMP 06/16/2019   SpO2 97%   Face to face Exam:   General: Awake  HEENT: Atraumatic  Resp: Normal effort  Abd: Gravid, soft, tender along the right side and RUQ  MSK: moves all extremities  12:05 AM Discussed with MAU APP who accepts in transfer.  Abdominal pain, unspecified abdominal location        [employer]ito [employer] 11/20/19 [place], [place], DO 11/20/19 2112

## 2019-11-20 NOTE — Discharge Instructions (Signed)
Abdominal Pain During Pregnancy  Abdominal pain is common during pregnancy, and has many possible causes. Some causes are more serious than others, and sometimes the cause is not known. Abdominal pain can be a sign that labor is starting. It can also be caused by normal growth and stretching of muscles and ligaments during pregnancy. Always tell your health care provider if you have any abdominal pain. Follow these instructions at home:  Do not have sex or put anything in your vagina until your pain goes away completely.  Get plenty of rest until your pain improves.  Drink enough fluid to keep your urine pale yellow.  Take over-the-counter and prescription medicines only as told by your health care provider.  Keep all follow-up visits as told by your health care provider. This is important. Contact a health care provider if:  Your pain continues or gets worse after resting.  You have lower abdominal pain that: ? Comes and goes at regular intervals. ? Spreads to your back. ? Is similar to menstrual cramps.  You have pain or burning when you urinate. Get help right away if:  You have a fever or chills.  You have vaginal bleeding.  You are leaking fluid from your vagina.  You are passing tissue from your vagina.  You have vomiting or diarrhea that lasts for more than 24 hours.  Your baby is moving less than usual.  You feel very weak or faint.  You have shortness of breath.  You develop severe pain in your upper abdomen. Summary  Abdominal pain is common during pregnancy, and has many possible causes.  If you experience abdominal pain during pregnancy, tell your health care provider right away.  Follow your health care provider's home care instructions and keep all follow-up visits as directed. This information is not intended to replace advice given to you by your health care provider. Make sure you discuss any questions you have with your health care  provider. Document Revised: 01/27/2019 Document Reviewed: 01/11/2017 Elsevier Patient Education  [place].  Abdominal Bloating When you have abdominal bloating, your abdomen may feel full, tight, or painful. It may also look bigger than normal or swollen (distended). Common causes of abdominal bloating include:  Swallowing air.  Constipation.  Problems digesting food.  Eating too much.  Irritable bowel syndrome. This is a condition that affects the large intestine.  Lactose intolerance. This is an inability to digest lactose, a natural sugar in dairy products.  Celiac disease. This is a condition that affects the ability to digest gluten, a protein found in some grains.  Gastroparesis. This is a condition that slows down the movement of food in the stomach and small intestine. It is more common in people with diabetes mellitus.  Gastroesophageal reflux disease (GERD). This is a digestive condition that makes stomach acid flow back into the esophagus.  Urinary retention. This means that the body is holding onto urine, and the bladder cannot be emptied all the way. Follow these instructions at home: Eating and drinking  Avoid eating too much.  Try not to swallow air while talking or eating.  Avoid eating while lying down.  Avoid these foods and drinks: ? Foods that cause gas, such as broccoli, cabbage, cauliflower, and baked beans. ? Carbonated drinks. ? Hard candy. ? Chewing gum. Medicines  Take over-the-counter and prescription medicines only as told by your health care provider.  Take probiotic medicines. These medicines contain live bacteria or yeasts that can help digestion.  Take coated peppermint oil capsules. Activity  Try to exercise regularly. Exercise may help to relieve bloating that is caused by gas and relieve constipation. General instructions  Keep all follow-up visits as told by your health care provider. This is important. Contact a  health care provider if:  You have nausea and vomiting.  You have diarrhea.  You have abdominal pain.  You have unusual weight loss or weight gain.  You have severe pain, and medicines do not help. Get help right away if:  You have severe chest pain.  You have trouble breathing.  You have shortness of breath.  You have trouble urinating.  You have darker urine than normal.  You have blood in your stools or have dark, tarry stools. Summary  Abdominal bloating means that the abdomen is swollen.  Common causes of abdominal bloating are swallowing air, constipation, and problems digesting food.  Avoid eating too much and avoid swallowing air.  Avoid foods that cause gas, carbonated drinks, hard candy, and chewing gum. This information is not intended to replace advice given to you by your health care provider. Make sure you discuss any questions you have with your health care provider. Document Revised: 01/27/2019 Document Reviewed: 11/10/2016 Elsevier Patient Education  [place].

## 2019-11-20 NOTE — MAU Note (Signed)
Patient reports to MAU c/o RUQ pain that started 1/27 @0730  pt took tylenol extra strength at 1630 with no relief. Pt reports it hurts to do anything even lying down. No bleeding. Pt reports white discharge. No urinary symptoms. +FM

## 2019-11-20 NOTE — MAU Provider Note (Signed)
Chief Complaint:  51/2 months pregnant/ Abdominal Cramping   First Provider Initiated Contact with Patient 11/20/19 0039     HPI: Judy James is a 33 y.o. G3P1011 at 21w6dwho presents to maternity admissions reporting pain in right upper abdomen since 7am.  Some nausea but no vomiting.  No constipation or diarrhea. . She reports good fetal movement, denies LOF, vaginal bleeding, vaginal itching/burning, urinary symptoms, h/a, dizziness, diarrhea, constipation or fever/chills.  .  Abdominal Pain This is a new problem. The current episode started today. The problem occurs constantly. The problem has been gradually worsening. The pain is located in the RUQ. The pain is severe. The quality of the pain is aching, dull and cramping. The abdominal pain does not radiate. Associated symptoms include frequency and nausea. Pertinent negatives include no constipation, diarrhea, dysuria, fever, headaches, myalgias or vomiting. The pain is aggravated by palpation. The pain is relieved by nothing. She has tried nothing for the symptoms.    RN Note Patient reports to MAU c/o RUQ pain that started 1/27 @0730 pt took tylenol extra strength at 1630 with no relief. Pt reports it hurts to do anything even lying down. No bleeding. Pt reports white discharge. No urinary symptoms. +FM  Past Medical History: Past Medical History:  Diagnosis Date  . Arthritis    knee  . Knee pain   . Migraines     Past obstetric history: OB History  Gravida Para Term Preterm AB Living  3 1 1   1 1  SAB TAB Ectopic Multiple Live Births  1       1    # Outcome Date GA Lbr Len/2nd Weight Sex Delivery Anes PTL Lv  3 Current           2 SAB 10/23/18          1 Term 01/17/07 [redacted]w[redacted]d   F CS-LTranv   LIV     Complications: Fetal Intolerance    Past Surgical History: Past Surgical History:  Procedure Laterality Date  . CESAREAN SECTION  2008  . DILATION AND EVACUATION N/A 11/26/2018   Procedure: DILATATION AND EVACUATION;   Surgeon: Pickens, Charlie, MD;  Location: [facility];  Service: Gynecology;  Laterality: N/A;  . WISDOM TOOTH EXTRACTION      Family History: Family History  Problem Relation Age of Onset  . Diabetes Mother   . Diabetes Maternal Aunt   . Hearing loss Maternal Aunt   . Heart disease Maternal Aunt   . Hypertension Maternal Aunt   . Stroke Maternal Aunt   . Alcohol abuse Maternal Uncle   . Diabetes Maternal Uncle   . Drug abuse Maternal Uncle   . Hyperlipidemia Maternal Uncle   . Hypertension Maternal Uncle   . Heart disease Maternal Uncle   . Cancer Maternal Grandmother   . Diabetes Maternal Grandmother   . COPD Maternal Grandfather     Social History: Social History   Tobacco Use  . Smoking status: Never Smoker  . Smokeless tobacco: Never Used  Substance Use Topics  . Alcohol use: Not Currently    Alcohol/week: 0.0 standard drinks    Comment: 0-1  . Drug use: No    Allergies:  Allergies  Allergen Reactions  . Penicillin G Hives    Meds:  Medications Prior to Admission  Medication Sig Dispense Refill Last Dose  . Blood Pressure Monitoring (BLOOD PRESSURE KIT) DEVI 1 kit by Does not apply route once a week. Check BP regularly and record   readings into the Babyscripts App. Large Cuff. DX O90.0 (Patient not taking: Reported on 10/08/2019) 1 each 0   . docusate sodium (COLACE) 100 MG capsule Take 1 capsule (100 mg total) by mouth 2 (two) times daily as needed. (Patient not taking: Reported on 12/19/2018) 30 capsule 2   . Elastic Bandages & Supports (COMFORT FIT MATERNITY SUPP LG) MISC 1 Units by Does not apply route daily as needed. (Patient not taking: Reported on 10/08/2019) 1 each 0   . ferrous sulfate 325 (65 FE) MG tablet Take 1 tablet (325 mg total) by mouth 2 (two) times daily with a meal. 60 tablet 0   . ondansetron (ZOFRAN ODT) 4 MG disintegrating tablet Take 1 tablet (4 mg total) by mouth every 8 (eight) hours as needed for nausea or vomiting. (Patient not taking:  Reported on 11/23/2018) 15 tablet 0   . Prenatal Vit-Fe Fumarate-FA (MULTIVITAMIN-PRENATAL) 27-0.8 MG TABS tablet Take 1 tablet by mouth daily at 12 noon.       I have reviewed patient's Past Medical Hx, Surgical Hx, Family Hx, Social Hx, medications and allergies.   ROS:  Review of Systems  Constitutional: Negative for fever.  Gastrointestinal: Positive for abdominal pain and nausea. Negative for constipation, diarrhea and vomiting.  Genitourinary: Positive for frequency. Negative for dysuria.  Musculoskeletal: Negative for myalgias.  Neurological: Negative for headaches.   Other systems negative  Physical Exam   Patient Vitals for the past 24 hrs:  BP Temp Temp src Pulse Resp SpO2  11/20/19 0021 (!) 111/55 98.9 F (37.2 C) Oral 86 17 -  11/19/19 2344 113/62 98.5 F (36.9 C) - 90 18 97 %   Constitutional: Well-developed, well-nourished female in no acute distress.  Cardiovascular: normal rate and rhythm Respiratory: normal effort, clear to auscultation bilaterally GI: Abd soft, tender over RUQ, gravid appropriate for gestational age.   No rebound, but mild guarding. MS: Extremities nontender, no edema, normal ROM Neurologic: Alert and oriented x 4.  GU: Neg CVAT.  PELVIC EXAM: deferred  FHT: 144   Labs: Results for orders placed or performed during the hospital encounter of 11/19/19 (from the past 24 hour(s))  Lipase, blood     Status: None   Collection Time: 11/19/19 11:58 PM  Result Value Ref Range   Lipase 22 11 - 51 U/L  Comprehensive metabolic panel     Status: Abnormal   Collection Time: 11/19/19 11:58 PM  Result Value Ref Range   Sodium 137 135 - 145 mmol/L   Potassium 4.0 3.5 - 5.1 mmol/L   Chloride 107 98 - 111 mmol/L   CO2 22 22 - 32 mmol/L   Glucose, Bld 103 (H) 70 - 99 mg/dL   BUN <5 (L) 6 - 20 mg/dL   Creatinine, Ser 0.59 0.44 - 1.00 mg/dL   Calcium 8.7 (L) 8.9 - 10.3 mg/dL   Total Protein 5.7 (L) 6.5 - 8.1 g/dL   Albumin 2.9 (L) 3.5 - 5.0 g/dL    AST 28 15 - 41 U/L   ALT 39 0 - 44 U/L   Alkaline Phosphatase 41 38 - 126 U/L   Total Bilirubin 0.3 0.3 - 1.2 mg/dL   GFR calc non Af Amer >60 >60 mL/min   GFR calc Af Amer >60 >60 mL/min   Anion gap 8 5 - 15  CBC     Status: Abnormal   Collection Time: 11/19/19 11:58 PM  Result Value Ref Range   WBC 12.0 (H) 4.0 - 10.5 K/uL  RBC 3.91 3.87 - 5.11 MIL/uL   Hemoglobin 12.1 12.0 - 15.0 g/dL   HCT 35.2 (L) 36.0 - 46.0 %   MCV 90.0 80.0 - 100.0 fL   MCH 30.9 26.0 - 34.0 pg   MCHC 34.4 30.0 - 36.0 g/dL   RDW 14.4 11.5 - 15.5 %   Platelets 180 150 - 400 K/uL   nRBC 0.0 0.0 - 0.2 %  Urinalysis, Routine w reflex microscopic     Status: Abnormal   Collection Time: 11/20/19 12:52 AM  Result Value Ref Range   Color, Urine YELLOW YELLOW   APPearance HAZY (A) CLEAR   Specific Gravity, Urine 1.011 1.005 - 1.030   pH 7.0 5.0 - 8.0   Glucose, UA 50 (A) NEGATIVE mg/dL   Hgb urine dipstick NEGATIVE NEGATIVE   Bilirubin Urine NEGATIVE NEGATIVE   Ketones, ur NEGATIVE NEGATIVE mg/dL   Protein, ur NEGATIVE NEGATIVE mg/dL   Nitrite NEGATIVE NEGATIVE   Leukocytes,Ua NEGATIVE NEGATIVE    O/Positive/-- (11/13 0946)  Imaging:  US ABDOMEN LIMITED RUQ  Result Date: 11/20/2019 CLINICAL DATA:  Right upper quadrant pain for 1 day, nearly [redacted] weeks pregnant EXAM: ULTRASOUND ABDOMEN LIMITED RIGHT UPPER QUADRANT COMPARISON:  CT abdomen pelvis 12/14/2011 FINDINGS: Gallbladder: No gallstones or wall thickening visualized. No sonographic Murphy sign noted by sonographer. Common bile duct: Diameter: 4 mm, nondilated. Liver: No focal lesion identified. Within normal limits in parenchymal echogenicity. Portal vein is patent on color Doppler imaging with normal direction of blood flow towards the liver. Other: None. IMPRESSION: Unremarkable right upper quadrant ultrasound. Electronically Signed   By: Lovena Le M.D.   On: 11/20/2019 01:25     MAU Course/MDM: I have ordered labs and reviewed results. Labs are  normal  RUQ [place] is normal.   Suspect pain may be abdominal gas that has sequestered in RUQ.   Mylicon and Levsin given with some relief and did result in passing gas.  Consult Dr Harolyn Rutherford with presentation, exam findings and test results.    Assessment: 1. Abdominal pain, unspecified abdominal location   2.     Pregnancy at 26w6d3.      Abdominal bloating  Plan: Discharge home Preterm Labor precautions and fetal kick counts Rx Mylicon for prn use at home for gas pains Follow up in Office for prenatal visits   Encouraged to return here or to other Urgent Care/ED if she develops worsening of symptoms, increase in pain, fever, or other concerning symptoms.  Pt stable at time of discharge.  MHansel FeinsteinCNM, MSN Certified Nurse-Midwife 11/20/2019 12:39 AM

## 2019-11-27 ENCOUNTER — Encounter: Payer: Self-pay | Admitting: Nurse Practitioner

## 2019-11-28 ENCOUNTER — Other Ambulatory Visit: Payer: Self-pay

## 2019-11-28 ENCOUNTER — Ambulatory Visit (HOSPITAL_COMMUNITY)
Admission: RE | Admit: 2019-11-28 | Discharge: 2019-11-28 | Disposition: A | Discharge status: Home / Self Care | Payer: BC Managed Care – PPO | Source: Ambulatory Visit | Attending: Obstetrics | Admitting: Obstetrics

## 2019-11-28 ENCOUNTER — Telehealth (INDEPENDENT_AMBULATORY_CARE_PROVIDER_SITE_OTHER): Payer: BC Managed Care – PPO | Admitting: Nurse Practitioner

## 2019-11-28 ENCOUNTER — Other Ambulatory Visit (HOSPITAL_COMMUNITY): Payer: Self-pay | Admitting: *Deleted

## 2019-11-28 DIAGNOSIS — D279 Benign neoplasm of unspecified ovary: Secondary | ICD-10-CM

## 2019-11-28 DIAGNOSIS — Z362 Encounter for other antenatal screening follow-up: Secondary | ICD-10-CM

## 2019-11-28 DIAGNOSIS — O34219 Maternal care for unspecified type scar from previous cesarean delivery: Secondary | ICD-10-CM | POA: Diagnosis not present

## 2019-11-28 DIAGNOSIS — O99212 Obesity complicating pregnancy, second trimester: Secondary | ICD-10-CM | POA: Diagnosis not present

## 2019-11-28 DIAGNOSIS — O3482 Maternal care for other abnormalities of pelvic organs, second trimester: Secondary | ICD-10-CM

## 2019-11-28 DIAGNOSIS — Z3A23 23 weeks gestation of pregnancy: Secondary | ICD-10-CM

## 2019-11-28 DIAGNOSIS — Z3482 Encounter for supervision of other normal pregnancy, second trimester: Secondary | ICD-10-CM

## 2019-11-28 DIAGNOSIS — D369 Benign neoplasm, unspecified site: Secondary | ICD-10-CM

## 2019-11-28 MED ORDER — VITAFOL ULTRA 29-0.6-0.4-200 MG PO CAPS: 1.0000 | ORAL_CAPSULE | Freq: Every day | ORAL | 11 refills | Status: AC

## 2019-11-28 NOTE — Progress Notes (Signed)
I connected with@ on 11/28/19 at 10:45 AM EST by: Mychart and verified that I am speaking with the correct person using two identifiers.  Patient is located at home and provider is located at [facility].     The purpose of this virtual visit is to provide medical care while limiting exposure to the novel coronavirus. I discussed the limitations, risks, security and privacy concerns of performing an evaluation and management service by MyChart and the availability of in person appointments. I also discussed with the patient that there may be a patient responsible charge related to this service. By engaging in this virtual visit, you consent to the provision of healthcare.  Additionally, you authorize for your insurance to be billed for the services provided during this visit.  The patient expressed understanding and agreed to proceed.  The following staff members participated in the virtual visit:  Keenan Bachelor, CMA and Earlie Server, NP    PRENATAL VISIT NOTE  Subjective:  Judy James is a 34 y.o. G3P1011 at [redacted]w[redacted]d  for phone visit for ongoing prenatal care.  She is currently monitored for the following issues for this low-risk pregnancy and has Migraines; Ovarian dermoid cyst complicating pregnancy, antepartum, unspecified trimester; Supervision of other normal pregnancy, antepartum; History of cesarean delivery, currently pregnant; and Obesity affecting pregnancy on their problem list.   Had ultrasound this morning.  Baby scripts data reviewed and all BPs are in the normal range.  Patient reports no complaints.  Contractions: Not present. Vag. Bleeding: None.  Movement: Present. Denies leaking of fluid.   The following portions of the patient's history were reviewed and updated as appropriate: allergies, current medications, past family history, past medical history, past social history, past surgical history and problem list.   Objective:  There were no vitals filed for this visit.  Self-Obtained  Fetal Status:     Movement: Present     Assessment and Plan:  Pregnancy: G3P1011 at [redacted]w[redacted]d 1. Encounter for supervision of other normal pregnancy in second trimester Doing well. Baby is moving. No swelling Reviewed pain management planned in labor and suggested she take online childbirth and breastfeeding classes.  Plans a VBAC and did not like the epidural last time. Taking baby aspirin daily Needs to see MD at next visit for [facility] consent.  Preterm labor symptoms and general obstetric precautions including but not limited to vaginal bleeding, contractions, leaking of fluid and fetal movement were reviewed in detail with the patient.  Return in about 4 weeks (around 12/26/2019) for In person with MD and 2 hr GTT.  Future Appointments  Date Time Provider [place]  01/30/2020  1:15 PM [place] 4 [facility]-MFCUS MFC-US     Time spent on virtual visit: 8 minutes  [place]ia [place], NP

## 2019-11-28 NOTE — Progress Notes (Signed)
Virtual ROB  Pt able to check B/P over the phone   B/P:119/57 P:70   CC: None   Pt needs refill on PNV's.

## 2019-12-26 ENCOUNTER — Ambulatory Visit (INDEPENDENT_AMBULATORY_CARE_PROVIDER_SITE_OTHER): Payer: BC Managed Care – PPO | Admitting: Obstetrics and Gynecology

## 2019-12-26 ENCOUNTER — Other Ambulatory Visit: Payer: BC Managed Care – PPO

## 2019-12-26 ENCOUNTER — Encounter: Payer: Self-pay | Admitting: Obstetrics and Gynecology

## 2019-12-26 ENCOUNTER — Other Ambulatory Visit: Payer: Self-pay

## 2019-12-26 VITALS — BP 104/62 | HR 87 | Wt 187.0 lb

## 2019-12-26 DIAGNOSIS — Z3480 Encounter for supervision of other normal pregnancy, unspecified trimester: Secondary | ICD-10-CM

## 2019-12-26 DIAGNOSIS — O99213 Obesity complicating pregnancy, third trimester: Secondary | ICD-10-CM

## 2019-12-26 DIAGNOSIS — E669 Obesity, unspecified: Secondary | ICD-10-CM

## 2019-12-26 DIAGNOSIS — O34219 Maternal care for unspecified type scar from previous cesarean delivery: Secondary | ICD-10-CM

## 2019-12-26 DIAGNOSIS — Z3A27 27 weeks gestation of pregnancy: Secondary | ICD-10-CM

## 2019-12-26 NOTE — Progress Notes (Addendum)
ROB/GTT.  C/o pain under left breast 8/10 x 1 week.  Declined TDAP.

## 2019-12-26 NOTE — Progress Notes (Signed)
   PRENATAL VISIT NOTE  Subjective:  Judy James is a 34 y.o. G3P1011 at [redacted]w[redacted]d being seen today for ongoing prenatal care.  She is currently monitored for the following issues for this low-risk pregnancy and has Migraines; Ovarian dermoid cyst complicating pregnancy, antepartum, unspecified trimester; Supervision of other normal pregnancy, antepartum; History of cesarean delivery, currently pregnant; and Obesity affecting pregnancy on their problem list.  Patient reports no complaints.  Contractions: Not present. Vag. Bleeding: None.  Movement: Present. Denies leaking of fluid.   The following portions of the patient's history were reviewed and updated as appropriate: allergies, current medications, past family history, past medical history, past social history, past surgical history and problem list.   Objective:   Vitals:   12/26/19 0920  BP: 104/62  Pulse: 87  Weight: 187 lb (84.8 kg)    Fetal Status: Fetal Heart Rate (bpm): 137 Fundal Height: 27 cm Movement: Present     General:  Alert, oriented and cooperative. Patient is in no acute distress.  Skin: Skin is warm and dry. No rash noted.   Cardiovascular: Normal heart rate noted  Respiratory: Normal respiratory effort, no problems with respiration noted  Abdomen: Soft, gravid, appropriate for gestational age.  Pain/Pressure: Present     Pelvic: Cervical exam deferred        Extremities: Normal range of motion.  Edema: Trace  Mental Status: Normal mood and affect. Normal behavior. Normal judgment and thought content.   Assessment and Plan:  Pregnancy: G3P1011 at [redacted]w[redacted]d 1. Supervision of other normal pregnancy, antepartum Third trimester labs today Patient is doing well without complaints Patient is researching pediatricians Patient plans natural family planning for contraception  2. Obesity affecting pregnancy in third trimester COntinue ASA  3. History of cesarean delivery, currently pregnant Previous LTCS due to  persistent OT position TOLAC consent signed today  Preterm labor symptoms and general obstetric precautions including but not limited to vaginal bleeding, contractions, leaking of fluid and fetal movement were reviewed in detail with the patient. Please refer to After Visit Summary for other counseling recommendations.   Return in about 2 weeks (around 01/09/2020) for Virtual, ROB, Low risk.  Future Appointments  Date Time Provider [place]  01/30/2020  1:15 PM [facility]-MFC [place] 4 [facility]-MFCUS MFC-US    Gianella Chismar, MD

## 2019-12-27 LAB — CBC
Hematocrit: 35.5 % (ref 34.0–46.6)
Hemoglobin: 12.2 g/dL (ref 11.1–15.9)
MCH: 30.3 pg (ref 26.6–33.0)
MCHC: 34.4 g/dL (ref 31.5–35.7)
MCV: 88 fL (ref 79–97)
Platelets: 158 10*3/uL (ref 150–450)
RBC: 4.03 x10E6/uL (ref 3.77–5.28)
RDW: 13.6 % (ref 11.7–15.4)
WBC: 9.3 10*3/uL (ref 3.4–10.8)

## 2019-12-27 LAB — GLUCOSE TOLERANCE, 2 HOURS W/ 1HR
Glucose, 1 hour: 156 mg/dL (ref 65–179)
Glucose, 2 hour: 156 mg/dL — ABNORMAL HIGH (ref 65–152)
Glucose, Fasting: 83 mg/dL (ref 65–91)

## 2019-12-27 LAB — RPR: RPR Ser Ql: NONREACTIVE

## 2019-12-27 LAB — HIV ANTIBODY (ROUTINE TESTING W REFLEX): HIV Screen 4th Generation wRfx: NONREACTIVE

## 2019-12-29 ENCOUNTER — Encounter: Payer: Self-pay | Admitting: Obstetrics and Gynecology

## 2019-12-29 ENCOUNTER — Other Ambulatory Visit: Payer: Self-pay | Admitting: Obstetrics and Gynecology

## 2019-12-29 DIAGNOSIS — Z3480 Encounter for supervision of other normal pregnancy, unspecified trimester: Secondary | ICD-10-CM

## 2019-12-29 DIAGNOSIS — O24419 Gestational diabetes mellitus in pregnancy, unspecified control: Secondary | ICD-10-CM

## 2019-12-30 ENCOUNTER — Telehealth: Payer: Self-pay

## 2019-12-30 MED ORDER — ACCU-CHEK SOFTCLIX LANCETS MISC: 1.0000 | Freq: Four times a day (QID) | 12 refills | Status: DC

## 2019-12-30 MED ORDER — ACCU-CHEK GUIDE W/DEVICE KIT: 1.0000 | PACK | Freq: Four times a day (QID) | 0 refills | Status: DC

## 2019-12-30 MED ORDER — GLUCOSE BLOOD VI STRP: 1.0000 | ORAL_STRIP | Freq: Four times a day (QID) | 12 refills | Status: DC

## 2019-12-30 NOTE — Telephone Encounter (Signed)
Advised of results, education appt and supplies sent to pharmacy.

## 2020-01-01 ENCOUNTER — Other Ambulatory Visit: Payer: BC Managed Care – PPO

## 2020-01-06 ENCOUNTER — Encounter: Payer: BC Managed Care – PPO | Attending: Obstetrics and Gynecology | Admitting: Registered"

## 2020-01-06 ENCOUNTER — Ambulatory Visit: Payer: BC Managed Care – PPO | Admitting: Registered"

## 2020-01-06 ENCOUNTER — Other Ambulatory Visit: Payer: Self-pay

## 2020-01-06 DIAGNOSIS — Z713 Dietary counseling and surveillance: Secondary | ICD-10-CM | POA: Insufficient documentation

## 2020-01-06 DIAGNOSIS — O24419 Gestational diabetes mellitus in pregnancy, unspecified control: Secondary | ICD-10-CM | POA: Diagnosis not present

## 2020-01-06 DIAGNOSIS — Z3A00 Weeks of gestation of pregnancy not specified: Secondary | ICD-10-CM | POA: Insufficient documentation

## 2020-01-06 NOTE — Progress Notes (Signed)
Patient was seen on 01/06/20 for Gestational Diabetes self-management. EDD 03/26/20. Patient states no history of GDM. Diet history obtained. Patient eats variety of all food groups. Beverages include mostly water, sweet tea, V8 Splash (17 g carbohydrate).  Patient is likely consuming inconsistent carbohydrates cereal/muffin and fruit for snacks.   The following learning objectives were met by the patient :   States the definition of Gestational Diabetes  States why dietary management is important in controlling blood glucose  Describes the effects of carbohydrates on blood glucose levels  Demonstrates ability to create a balanced meal plan  Demonstrates carbohydrate counting   States when to check blood glucose levels  Demonstrates proper blood glucose monitoring techniques  States the effect of stress and exercise on blood glucose levels  States the importance of limiting caffeine and abstaining from alcohol and smoking  Plan:  Aim for 3 Carbohydrate Choices per meal (45 grams) +/- 1 either way  Aim for 1-2 Carbohydrate Choices per snack Begin reading food labels for Total Carbohydrate of foods If OK with your MD, consider  increasing your activity level by walking, Arm Chair Exercises or other activity daily as tolerated Begin checking Blood Glucose before breakfast and 2 hours after first bite of breakfast, lunch and dinner as directed by MD  Baby Scripts: Glucose management added, Pt states she will use to record blood glucose readings. Take medication if directed by MD  Blood glucose monitor given: none - Patient brought meter to visit Blood glucose reading: 93 mg/dL  Patient instructed to monitor glucose levels: FBS: 60 - 95 mg/dl 2 hour: <120 mg/dl  Patient received the following handouts:  Nutrition Diabetes and Pregnancy  Carbohydrate Counting List  Blood glucose Log Sheet  Patient will be seen for follow-up in as needed.

## 2020-01-08 ENCOUNTER — Other Ambulatory Visit: Payer: Self-pay

## 2020-01-09 ENCOUNTER — Telehealth (INDEPENDENT_AMBULATORY_CARE_PROVIDER_SITE_OTHER): Payer: BC Managed Care – PPO | Admitting: Obstetrics

## 2020-01-09 ENCOUNTER — Encounter: Payer: Self-pay | Admitting: Obstetrics

## 2020-01-09 VITALS — BP 121/66 | HR 91

## 2020-01-09 DIAGNOSIS — E669 Obesity, unspecified: Secondary | ICD-10-CM

## 2020-01-09 DIAGNOSIS — O34219 Maternal care for unspecified type scar from previous cesarean delivery: Secondary | ICD-10-CM

## 2020-01-09 DIAGNOSIS — O24419 Gestational diabetes mellitus in pregnancy, unspecified control: Secondary | ICD-10-CM

## 2020-01-09 DIAGNOSIS — O99213 Obesity complicating pregnancy, third trimester: Secondary | ICD-10-CM

## 2020-01-09 DIAGNOSIS — Z3A29 29 weeks gestation of pregnancy: Secondary | ICD-10-CM

## 2020-01-09 DIAGNOSIS — O0990 Supervision of high risk pregnancy, unspecified, unspecified trimester: Secondary | ICD-10-CM

## 2020-01-09 NOTE — Progress Notes (Signed)
   OBSTETRICS PRENATAL VIRTUAL VISIT ENCOUNTER NOTE  Provider location: [facility] [place] [facility] [place]   I connected with Judy James on 01/09/20 at 10:45 AM EDT by MyChart Video Encounter at home and verified that I am speaking with the correct person using two identifiers.   I discussed the limitations, risks, security and privacy concerns of performing an evaluation and management service virtually and the availability of in person appointments. I also discussed with the patient that there may be a patient responsible charge related to this service. The patient expressed understanding and agreed to proceed. Subjective:  Judy James is a 34 y.o. G3P1011 at [redacted]w[redacted]d being seen today for ongoing prenatal care.  She is currently monitored for the following issues for this high-risk pregnancy and has Migraines; Ovarian dermoid cyst complicating pregnancy, antepartum, unspecified trimester; Supervision of other normal pregnancy, antepartum; History of cesarean delivery, currently pregnant; Obesity affecting pregnancy; and Gestational diabetes mellitus (GDM) affecting pregnancy, antepartum on their problem list.  Patient reports backache.  Contractions: Not present. Vag. Bleeding: None.  Movement: Present. Denies any leaking of fluid.   The following portions of the patient's history were reviewed and updated as appropriate: allergies, current medications, past family history, past medical history, past social history, past surgical history and problem list.   Objective:   Vitals:   01/09/20 0958  BP: 121/66  Pulse: 91    Fetal Status:     Movement: Present     General:  Alert, oriented and cooperative. Patient is in no acute distress.  Respiratory: Normal respiratory effort, no problems with respiration noted  Mental Status: Normal mood and affect. Normal behavior. Normal judgment and thought content.  Rest of physical exam deferred due to type of encounter  Imaging: No  results found.  Assessment and Plan:  Pregnancy: G3P1011 at [redacted]w[redacted]d 1. Supervision of high risk pregnancy, antepartum  2. Gestational diabetes mellitus (GDM) affecting pregnancy, antepartum - doing well - FBS = 90 or less.  PP's = 120's  3. History of cesarean delivery, currently pregnant - desires TOLAC.  Need previous op note   4. Obesity affecting pregnancy in third trimester   Preterm labor symptoms and general obstetric precautions including but not limited to vaginal bleeding, contractions, leaking of fluid and fetal movement were reviewed in detail with the patient. I discussed the assessment and treatment plan with the patient. The patient was provided an opportunity to ask questions and all were answered. The patient agreed with the plan and demonstrated an understanding of the instructions. The patient was advised to call back or seek an in-person office evaluation/go to MAU at [facility] for any urgent or concerning symptoms. Please refer to After Visit Summary for other counseling recommendations.   I provided 10 minutes of face-to-face time during this encounter.  Return in about 2 weeks (around 01/23/2020) for MyChart.  Future Appointments  Date Time Provider [place]  01/30/2020  1:15 PM [facility] [place], [place] for [facility] [place] [facility] 01/09/2020

## 2020-01-09 NOTE — Progress Notes (Signed)
S/w pt for virtual visit, pt reports fetal movement and a lot of pressure, denies pain and bleeding.  Fasting 90, Breakfast 108, Lunch 119, Bedtime 127

## 2020-01-22 ENCOUNTER — Telehealth (INDEPENDENT_AMBULATORY_CARE_PROVIDER_SITE_OTHER): Payer: BC Managed Care – PPO | Admitting: Obstetrics

## 2020-01-22 ENCOUNTER — Encounter: Payer: Self-pay | Admitting: Obstetrics

## 2020-01-22 DIAGNOSIS — Z3A30 30 weeks gestation of pregnancy: Secondary | ICD-10-CM

## 2020-01-22 DIAGNOSIS — O24419 Gestational diabetes mellitus in pregnancy, unspecified control: Secondary | ICD-10-CM

## 2020-01-22 NOTE — Progress Notes (Signed)
   OBSTETRICS PRENATAL VIRTUAL VISIT ENCOUNTER NOTE  Provider location: [facility] [place] [facility] [place]   I connected with Judy James on 01/22/20 at 10:30 AM EDT by MyChart Video Encounter at home and verified that I am speaking with the correct person using two identifiers.   I discussed the limitations, risks, security and privacy concerns of performing an evaluation and management service virtually and the availability of in person appointments. I also discussed with the patient that there may be a patient responsible charge related to this service. The patient expressed understanding and agreed to proceed. Subjective:  Judy James is a 34 y.o. G3P1011 at [redacted]w[redacted]d being seen today for ongoing prenatal care.  She is currently monitored for the following issues for this low-risk pregnancy and has Migraines; Ovarian dermoid cyst complicating pregnancy, antepartum, unspecified trimester; Supervision of other normal pregnancy, antepartum; History of cesarean delivery, currently pregnant; Obesity affecting pregnancy; and Gestational diabetes mellitus (GDM) affecting pregnancy, antepartum on their problem list.  Patient reports carpal tunnel symptoms.  Contractions: Not present. Vag. Bleeding: None.  Movement: Present. Denies any leaking of fluid.   The following portions of the patient's history were reviewed and updated as appropriate: allergies, current medications, past family history, past medical history, past social history, past surgical history and problem list.   Objective:   Vitals:   01/22/20 1033  BP: 130/68  Pulse: 99    Fetal Status:     Movement: Present     General:  Alert, oriented and cooperative. Patient is in no acute distress.  Respiratory: Normal respiratory effort, no problems with respiration noted  Mental Status: Normal mood and affect. Normal behavior. Normal judgment and thought content.  Rest of physical exam deferred due to type of  encounter  Imaging: No results found.  Assessment and Plan:  Pregnancy: G3P1011 at [redacted]w[redacted]d 1. Gestational diabetes mellitus (GDM) affecting pregnancy, antepartum   Preterm labor symptoms and general obstetric precautions including but not limited to vaginal bleeding, contractions, leaking of fluid and fetal movement were reviewed in detail with the patient. I discussed the assessment and treatment plan with the patient. The patient was provided an opportunity to ask questions and all were answered. The patient agreed with the plan and demonstrated an understanding of the instructions. The patient was advised to call back or seek an in-person office evaluation/go to MAU at [facility] for any urgent or concerning symptoms. Please refer to After Visit Summary for other counseling recommendations.   I provided 10 minutes of face-to-face time during this encounter.  Return in about 2 weeks (around 02/05/2020) for MyChart.  Future Appointments  Date Time Provider [place]  01/30/2020  1:15 PM [facility] [place], [place] for [facility] [place] [facility] 01/22/2020

## 2020-01-22 NOTE — Progress Notes (Signed)
Pt presents for Mychart visit ROB. Pt identified with 2 patient identifiers. Her bp today is 130/68. Pt has no concerns today.

## 2020-01-30 ENCOUNTER — Ambulatory Visit (HOSPITAL_COMMUNITY)
Admission: RE | Admit: 2020-01-30 | Discharge: 2020-01-30 | Disposition: A | Discharge status: Home / Self Care | Payer: BC Managed Care – PPO | Source: Ambulatory Visit | Attending: Obstetrics and Gynecology | Admitting: Obstetrics and Gynecology

## 2020-01-30 ENCOUNTER — Other Ambulatory Visit (HOSPITAL_COMMUNITY): Payer: Self-pay | Admitting: *Deleted

## 2020-01-30 ENCOUNTER — Other Ambulatory Visit: Payer: Self-pay

## 2020-01-30 DIAGNOSIS — Z3A32 32 weeks gestation of pregnancy: Secondary | ICD-10-CM

## 2020-01-30 DIAGNOSIS — O24410 Gestational diabetes mellitus in pregnancy, diet controlled: Secondary | ICD-10-CM | POA: Diagnosis not present

## 2020-01-30 DIAGNOSIS — O3483 Maternal care for other abnormalities of pelvic organs, third trimester: Secondary | ICD-10-CM

## 2020-01-30 DIAGNOSIS — O99213 Obesity complicating pregnancy, third trimester: Secondary | ICD-10-CM | POA: Diagnosis not present

## 2020-01-30 DIAGNOSIS — O34219 Maternal care for unspecified type scar from previous cesarean delivery: Secondary | ICD-10-CM | POA: Diagnosis not present

## 2020-01-30 DIAGNOSIS — D369 Benign neoplasm, unspecified site: Secondary | ICD-10-CM | POA: Diagnosis not present

## 2020-01-30 DIAGNOSIS — Z362 Encounter for other antenatal screening follow-up: Secondary | ICD-10-CM | POA: Diagnosis not present

## 2020-01-30 DIAGNOSIS — D279 Benign neoplasm of unspecified ovary: Secondary | ICD-10-CM

## 2020-02-05 DIAGNOSIS — Z3493 Encounter for supervision of normal pregnancy, unspecified, third trimester: Secondary | ICD-10-CM

## 2020-02-06 ENCOUNTER — Telehealth (INDEPENDENT_AMBULATORY_CARE_PROVIDER_SITE_OTHER): Payer: BC Managed Care – PPO | Admitting: Family Medicine

## 2020-02-06 ENCOUNTER — Encounter: Payer: Self-pay | Admitting: Family Medicine

## 2020-02-06 VITALS — BP 115/62 | HR 86

## 2020-02-06 DIAGNOSIS — O99213 Obesity complicating pregnancy, third trimester: Secondary | ICD-10-CM

## 2020-02-06 DIAGNOSIS — O34219 Maternal care for unspecified type scar from previous cesarean delivery: Secondary | ICD-10-CM

## 2020-02-06 DIAGNOSIS — Z3480 Encounter for supervision of other normal pregnancy, unspecified trimester: Secondary | ICD-10-CM

## 2020-02-06 DIAGNOSIS — O24419 Gestational diabetes mellitus in pregnancy, unspecified control: Secondary | ICD-10-CM

## 2020-02-06 DIAGNOSIS — Z3A33 33 weeks gestation of pregnancy: Secondary | ICD-10-CM

## 2020-02-06 DIAGNOSIS — O3483 Maternal care for other abnormalities of pelvic organs, third trimester: Secondary | ICD-10-CM

## 2020-02-06 DIAGNOSIS — D279 Benign neoplasm of unspecified ovary: Secondary | ICD-10-CM

## 2020-02-06 NOTE — Progress Notes (Signed)
I connected with  Judy James on 02/06/20 by a video enabled telemedicine application and verified that I am speaking with the correct person using two identifiers.   I discussed the limitations of evaluation and management by telemedicine. The patient expressed understanding and agreed to proceed.  MyChart ROB, c/o cramping 6/10 x 3 days.

## 2020-02-06 NOTE — Progress Notes (Signed)
Patient ID: Judy James, female   DOB: 12/18/1985, 34 y.o.   MRN: [id number]  I connected with@ on 02/06/20 at 10:40 AM EDT by: MyChart and verified that I am speaking with the correct person using two identifiers.  Patient is located at home and provider is located at [facility].     The purpose of this virtual visit is to provide medical care while limiting exposure to the novel coronavirus. I discussed the limitations, risks, security and privacy concerns of performing an evaluation and management service by Mychart and the availability of in person appointments. I also discussed with the patient that there may be a patient responsible charge related to this service. By engaging in this virtual visit, you consent to the provision of healthcare.  Additionally, you authorize for your insurance to be billed for the services provided during this visit.  The patient expressed understanding and agreed to proceed.  The following staff members participated in the virtual visit:  Clarnce Flock, MD/MPH    PRENATAL VISIT NOTE  Subjective:  Judy James is a 34 y.o. G3P1011 at [redacted]w[redacted]d  for phone visit for ongoing prenatal care.  She is currently monitored for the following issues for this high-risk pregnancy and has Migraines; Ovarian dermoid cyst complicating pregnancy, antepartum, unspecified trimester; Supervision of other normal pregnancy, antepartum; History of cesarean delivery, currently pregnant; Obesity affecting pregnancy; and Gestational diabetes mellitus (GDM) affecting pregnancy, antepartum on their problem list.  Patient reports cramping.  Contractions: Not present. Vag. Bleeding: None.  Movement: Present. Denies leaking of fluid.   The following portions of the patient's history were reviewed and updated as appropriate: allergies, current medications, past family history, past medical history, past social history, past surgical history and problem list.   Objective:   Vitals:   02/06/20 1029  BP: 115/62  Pulse: 86   Self-Obtained  Fetal Status:     Movement: Present     Assessment and Plan:  Pregnancy: G3P1011 at [redacted]w[redacted]d 1. Gestational diabetes mellitus (GDM) affecting pregnancy, antepartum Sugars reviewed for past week, does not have all values Fasting: 93,90,83,80,91 PP B 1hr:108,112,108,117 PP L 1hr:119,123,120,116 PP D 1hr:120,123 Overall good control Had f/u growth scan on 01/30/2020, 47% EFW 1935g, normal growth  2. Supervision of other normal pregnancy, antepartum Stable Endorsing some mild R sided pain she was concerned is her cyst, by history more c/w round ligament pain, reassured but reviewed warning signs that should prompt visit to [facility] virtual in 2 weeks ROB in person in 3 weeks for swabs  3. History of cesarean delivery, currently pregnant Desires [facility], consent signed 12/26/2019 Op note in media 09/09/2019  4. Ovarian dermoid cyst complicating pregnancy, antepartum, unspecified trimester Stable on last [place] on 01/30/2020  5. Obesity affecting pregnancy in third trimester   Preterm labor symptoms and general obstetric precautions including but not limited to vaginal bleeding, contractions, leaking of fluid and fetal movement were reviewed in detail with the patient.  Return in 2 weeks (on 02/20/2020).  Future Appointments  Date Time Provider [place]  02/27/2020  1:10 PM [place] [facility]  02/27/2020  1:15 PM [place] 4 [facility]     Time spent on virtual visit: 15 minutes  Clarnce Flock, MD

## 2020-02-06 NOTE — Patient Instructions (Signed)
Contraception Choices Contraception, also called birth control, refers to methods or devices that prevent pregnancy. Hormonal methods Contraceptive implant  A contraceptive implant is a thin, plastic tube that contains a hormone. It is inserted into the upper part of the arm. It can remain in place for up to 3 years. Progestin-only injections Progestin-only injections are injections of progestin, a synthetic form of the hormone progesterone. They are given every 3 months by a health care provider. Birth control pills  Birth control pills are pills that contain hormones that prevent pregnancy. They must be taken once a day, preferably at the same time each day. Birth control patch  The birth control patch contains hormones that prevent pregnancy. It is placed on the skin and must be changed once a week for three weeks and removed on the fourth week. A prescription is needed to use this method of contraception. Vaginal ring  A vaginal ring contains hormones that prevent pregnancy. It is placed in the vagina for three weeks and removed on the fourth week. After that, the process is repeated with a new ring. A prescription is needed to use this method of contraception. Emergency contraceptive Emergency contraceptives prevent pregnancy after unprotected sex. They come in pill form and can be taken up to 5 days after sex. They work best the sooner they are taken after having sex. Most emergency contraceptives are available without a prescription. This method should not be used as your only form of birth control. Barrier methods Female condom  A female condom is a thin sheath that is worn over the penis during sex. Condoms keep sperm from going inside a woman's body. They can be used with a spermicide to increase their effectiveness. They should be disposed after a single use. Female condom  A female condom is a soft, loose-fitting sheath that is put into the vagina before sex. The condom keeps  sperm from going inside a woman's body. They should be disposed after a single use. Diaphragm  A diaphragm is a soft, dome-shaped barrier. It is inserted into the vagina before sex, along with a spermicide. The diaphragm blocks sperm from entering the uterus, and the spermicide kills sperm. A diaphragm should be left in the vagina for 6-8 hours after sex and removed within 24 hours. A diaphragm is prescribed and fitted by a health care provider. A diaphragm should be replaced every 1-2 years, after giving birth, after gaining more than 15 lb (6.8 kg), and after pelvic surgery. Cervical cap  A cervical cap is a round, soft latex or plastic cup that fits over the cervix. It is inserted into the vagina before sex, along with spermicide. It blocks sperm from entering the uterus. The cap should be left in place for 6-8 hours after sex and removed within 48 hours. A cervical cap must be prescribed and fitted by a health care provider. It should be replaced every 2 years. Sponge  A sponge is a soft, circular piece of polyurethane foam with spermicide on it. The sponge helps block sperm from entering the uterus, and the spermicide kills sperm. To use it, you make it wet and then insert it into the vagina. It should be inserted before sex, left in for at least 6 hours after sex, and removed and thrown away within 30 hours. Spermicides Spermicides are chemicals that kill or block sperm from entering the cervix and uterus. They can come as a cream, jelly, suppository, foam, or tablet. A spermicide should be inserted into  the vagina with an applicator at least 10-15 minutes before sex to allow time for it to work. The process must be repeated every time you have sex. Spermicides do not require a prescription. Intrauterine contraception Intrauterine device (IUD) An IUD is a T-shaped device that is put in a woman's uterus. There are two types:  Hormone IUD.This type contains progestin, a synthetic form of the  hormone progesterone. This type can stay in place for 3-5 years.  Copper IUD.This type is wrapped in copper wire. It can stay in place for 10 years.  Permanent methods of contraception Female tubal ligation In this method, a woman's fallopian tubes are sealed, tied, or blocked during surgery to prevent eggs from traveling to the uterus. Hysteroscopic sterilization In this method, a small, flexible insert is placed into each fallopian tube. The inserts cause scar tissue to form in the fallopian tubes and block them, so sperm cannot reach an egg. The procedure takes about 3 months to be effective. Another form of birth control must be used during those 3 months. Female sterilization This is a procedure to tie off the tubes that carry sperm (vasectomy). After the procedure, the man can still ejaculate fluid (semen). Natural planning methods Natural family planning In this method, a couple does not have sex on days when the woman could become pregnant. Calendar method This means keeping track of the length of each menstrual cycle, identifying the days when pregnancy can happen, and not having sex on those days. Ovulation method In this method, a couple avoids sex during ovulation. Symptothermal method This method involves not having sex during ovulation. The woman typically checks for ovulation by watching changes in her temperature and in the consistency of cervical mucus. Post-ovulation method In this method, a couple waits to have sex until after ovulation. Summary  Contraception, also called birth control, means methods or devices that prevent pregnancy.  Hormonal methods of contraception include implants, injections, pills, patches, vaginal rings, and emergency contraceptives.  Barrier methods of contraception can include female condoms, female condoms, diaphragms, cervical caps, sponges, and spermicides.  There are two types of IUDs (intrauterine devices). An IUD can be put in a woman's  uterus to prevent pregnancy for 3-5 years.  Permanent sterilization can be done through a procedure for males, females, or both.  Natural family planning methods involve not having sex on days when the woman could become pregnant. This information is not intended to replace advice given to you by your health care provider. Make sure you discuss any questions you have with your health care provider. Document Revised: 10/11/2017 Document Reviewed: 11/11/2016 Elsevier Patient Education  2020 [employer]   Breastfeeding  Choosing to breastfeed is one of the best decisions you can make for yourself and your baby. A change in hormones during pregnancy causes your breasts to make breast milk in your milk-producing glands. Hormones prevent breast milk from being released before your baby is born. They also prompt milk flow after birth. Once breastfeeding has begun, thoughts of your baby, as well as his or her sucking or crying, can stimulate the release of milk from your milk-producing glands. Benefits of breastfeeding Research shows that breastfeeding offers many health benefits for infants and mothers. It also offers a cost-free and convenient way to feed your baby. For your baby  Your first milk (colostrum) helps your baby's digestive system to function better.  Special cells in your milk (antibodies) help your baby to fight off infections.  Breastfed babies are   less likely to develop asthma, allergies, obesity, or type 2 diabetes. They are also at lower risk for sudden infant death syndrome (SIDS).  Nutrients in breast milk are better able to meet your baby's needs compared to infant formula.  Breast milk improves your baby's brain development. For you  Breastfeeding helps to create a very special bond between you and your baby.  Breastfeeding is convenient. Breast milk costs nothing and is always available at the correct temperature.  Breastfeeding helps to burn calories. It helps you  to lose the weight that you gained during pregnancy.  Breastfeeding makes your uterus return faster to its size before pregnancy. It also slows bleeding (lochia) after you give birth.  Breastfeeding helps to lower your risk of developing type 2 diabetes, osteoporosis, rheumatoid arthritis, cardiovascular disease, and breast, ovarian, uterine, and endometrial cancer later in life. Breastfeeding basics Starting breastfeeding  Find a comfortable place to sit or lie down, with your neck and back well-supported.  Place a pillow or a rolled-up blanket under your baby to bring him or her to the level of your breast (if you are seated). Nursing pillows are specially designed to help support your arms and your baby while you breastfeed.  Make sure that your baby's tummy (abdomen) is facing your abdomen.  Gently massage your breast. With your fingertips, massage from the outer edges of your breast inward toward the nipple. This encourages milk flow. If your milk flows slowly, you may need to continue this action during the feeding.  Support your breast with 4 fingers underneath and your thumb above your nipple (make the letter "C" with your hand). Make sure your fingers are well away from your nipple and your baby's mouth.  Stroke your baby's lips gently with your finger or nipple.  When your baby's mouth is open wide enough, quickly bring your baby to your breast, placing your entire nipple and as much of the areola as possible into your baby's mouth. The areola is the colored area around your nipple. ? More areola should be visible above your baby's upper lip than below the lower lip. ? Your baby's lips should be opened and extended outward (flanged) to ensure an adequate, comfortable latch. ? Your baby's tongue should be between his or her lower gum and your breast.  Make sure that your baby's mouth is correctly positioned around your nipple (latched). Your baby's lips should create a seal on your  breast and be turned out (everted).  It is common for your baby to suck about 2-3 minutes in order to start the flow of breast milk. Latching Teaching your baby how to latch onto your breast properly is very important. An improper latch can cause nipple pain, decreased milk supply, and poor weight gain in your baby. Also, if your baby is not latched onto your nipple properly, he or she may swallow some air during feeding. This can make your baby fussy. Burping your baby when you switch breasts during the feeding can help to get rid of the air. However, teaching your baby to latch on properly is still the best way to prevent fussiness from swallowing air while breastfeeding. Signs that your baby has successfully latched onto your nipple  Silent tugging or silent sucking, without causing you pain. Infant's lips should be extended outward (flanged).  Swallowing heard between every 3-4 sucks once your milk has started to flow (after your let-down milk reflex occurs).  Muscle movement above and in front of his or her   ears while sucking. Signs that your baby has not successfully latched onto your nipple  Sucking sounds or smacking sounds from your baby while breastfeeding.  Nipple pain. If you think your baby has not latched on correctly, slip your finger into the corner of your baby's mouth to break the suction and place it between your baby's gums. Attempt to start breastfeeding again. Signs of successful breastfeeding Signs from your baby  Your baby will gradually decrease the number of sucks or will completely stop sucking.  Your baby will fall asleep.  Your baby's body will relax.  Your baby will retain a small amount of milk in his or her mouth.  Your baby will let go of your breast by himself or herself. Signs from you  Breasts that have increased in firmness, weight, and size 1-3 hours after feeding.  Breasts that are softer immediately after breastfeeding.  Increased milk  volume, as well as a change in milk consistency and color by the fifth day of breastfeeding.  Nipples that are not sore, cracked, or bleeding. Signs that your baby is getting enough milk  Wetting at least 1-2 diapers during the first 24 hours after birth.  Wetting at least 5-6 diapers every 24 hours for the first week after birth. The urine should be clear or pale yellow by the age of 5 days.  Wetting 6-8 diapers every 24 hours as your baby continues to grow and develop.  At least 3 stools in a 24-hour period by the age of 5 days. The stool should be soft and yellow.  At least 3 stools in a 24-hour period by the age of 7 days. The stool should be seedy and yellow.  No loss of weight greater than 10% of birth weight during the first 3 days of life.  Average weight gain of 4-7 oz (113-198 g) per week after the age of 4 days.  Consistent daily weight gain by the age of 5 days, without weight loss after the age of 2 weeks. After a feeding, your baby may spit up a small amount of milk. This is normal. Breastfeeding frequency and duration Frequent feeding will help you make more milk and can prevent sore nipples and extremely full breasts (breast engorgement). Breastfeed when you feel the need to reduce the fullness of your breasts or when your baby shows signs of hunger. This is called "breastfeeding on demand." Signs that your baby is hungry include:  Increased alertness, activity, or restlessness.  Movement of the head from side to side.  Opening of the mouth when the corner of the mouth or cheek is stroked (rooting).  Increased sucking sounds, smacking lips, cooing, sighing, or squeaking.  Hand-to-mouth movements and sucking on fingers or hands.  Fussing or crying. Avoid introducing a pacifier to your baby in the first 4-6 weeks after your baby is born. After this time, you may choose to use a pacifier. Research has shown that pacifier use during the first year of a baby's life  decreases the risk of sudden infant death syndrome (SIDS). Allow your baby to feed on each breast as long as he or she wants. When your baby unlatches or falls asleep while feeding from the first breast, offer the second breast. Because newborns are often sleepy in the first few weeks of life, you may need to awaken your baby to get him or her to feed. Breastfeeding times will vary from baby to baby. However, the following rules can serve as a guide to   help you make sure that your baby is properly fed:  Newborns (babies 4 weeks of age or younger) may breastfeed every 1-3 hours.  Newborns should not go without breastfeeding for longer than 3 hours during the day or 5 hours during the night.  You should breastfeed your baby a minimum of 8 times in a 24-hour period. Breast milk pumping     Pumping and storing breast milk allows you to make sure that your baby is exclusively fed your breast milk, even at times when you are unable to breastfeed. This is especially important if you go back to work while you are still breastfeeding, or if you are not able to be present during feedings. Your lactation consultant can help you find a method of pumping that works best for you and give you guidelines about how long it is safe to store breast milk. Caring for your breasts while you breastfeed Nipples can become dry, cracked, and sore while breastfeeding. The following recommendations can help keep your breasts moisturized and healthy:  Avoid using soap on your nipples.  Wear a supportive bra designed especially for nursing. Avoid wearing underwire-style bras or extremely tight bras (sports bras).  Air-dry your nipples for 3-4 minutes after each feeding.  Use only cotton bra pads to absorb leaked breast milk. Leaking of breast milk between feedings is normal.  Use lanolin on your nipples after breastfeeding. Lanolin helps to maintain your skin's normal moisture barrier. Pure lanolin is not harmful (not  toxic) to your baby. You may also hand express a few drops of breast milk and gently massage that milk into your nipples and allow the milk to air-dry. In the first few weeks after giving birth, some women experience breast engorgement. Engorgement can make your breasts feel heavy, warm, and tender to the touch. Engorgement peaks within 3-5 days after you give birth. The following recommendations can help to ease engorgement:  Completely empty your breasts while breastfeeding or pumping. You may want to start by applying warm, moist heat (in the shower or with warm, water-soaked hand towels) just before feeding or pumping. This increases circulation and helps the milk flow. If your baby does not completely empty your breasts while breastfeeding, pump any extra milk after he or she is finished.  Apply ice packs to your breasts immediately after breastfeeding or pumping, unless this is too uncomfortable for you. To do this: ? Put ice in a plastic bag. ? Place a towel between your skin and the bag. ? Leave the ice on for 20 minutes, 2-3 times a day.  Make sure that your baby is latched on and positioned properly while breastfeeding. If engorgement persists after 48 hours of following these recommendations, contact your health care provider or a lactation consultant. Overall health care recommendations while breastfeeding  Eat 3 healthy meals and 3 snacks every day. Well-nourished mothers who are breastfeeding need an additional 450-500 calories a day. You can meet this requirement by increasing the amount of a balanced diet that you eat.  Drink enough water to keep your urine pale yellow or clear.  Rest often, relax, and continue to take your prenatal vitamins to prevent fatigue, stress, and low vitamin and mineral levels in your body (nutrient deficiencies).  Do not use any products that contain nicotine or tobacco, such as cigarettes and e-cigarettes. Your baby may be harmed by chemicals from  cigarettes that pass into breast milk and exposure to secondhand smoke. If you need help quitting, ask your   health care provider.  Avoid alcohol.  Do not use illegal drugs or marijuana.  Talk with your health care provider before taking any medicines. These include over-the-counter and prescription medicines as well as vitamins and herbal supplements. Some medicines that may be harmful to your baby can pass through breast milk.  It is possible to become pregnant while breastfeeding. If birth control is desired, ask your health care provider about options that will be safe while breastfeeding your baby. Where to find more information: [employer]: www.llli.org Contact a health care provider if:  You feel like you want to stop breastfeeding or have become frustrated with breastfeeding.  Your nipples are cracked or bleeding.  Your breasts are red, tender, or warm.  You have: ? Painful breasts or nipples. ? A swollen area on either breast. ? A fever or chills. ? Nausea or vomiting. ? Drainage other than breast milk from your nipples.  Your breasts do not become full before feedings by the fifth day after you give birth.  You feel sad and depressed.  Your baby is: ? Too sleepy to eat well. ? Having trouble sleeping. ? More than 1 week old and wetting fewer than 6 diapers in a 24-hour period. ? Not gaining weight by 5 days of age.  Your baby has fewer than 3 stools in a 24-hour period.  Your baby's skin or the white parts of his or her eyes become yellow. Get help right away if:  Your baby is overly tired (lethargic) and does not want to wake up and feed.  Your baby develops an unexplained fever. Summary  Breastfeeding offers many health benefits for infant and mothers.  Try to breastfeed your infant when he or she shows early signs of hunger.  Gently tickle or stroke your baby's lips with your finger or nipple to allow the baby to open his or her mouth.  Bring the baby to your breast. Make sure that much of the areola is in your baby's mouth. Offer one side and burp the baby before you offer the other side.  Talk with your health care provider or lactation consultant if you have questions or you face problems as you breastfeed. This information is not intended to replace advice given to you by your health care provider. Make sure you discuss any questions you have with your health care provider. Document Revised: 01/03/2018 Document Reviewed: 11/10/2016 Elsevier Patient Education  2020 [employer] Inc.  

## 2020-02-20 ENCOUNTER — Telehealth (INDEPENDENT_AMBULATORY_CARE_PROVIDER_SITE_OTHER): Payer: BC Managed Care – PPO | Admitting: Family Medicine

## 2020-02-20 ENCOUNTER — Encounter: Payer: Self-pay | Admitting: Family Medicine

## 2020-02-20 VITALS — BP 120/61 | HR 72

## 2020-02-20 DIAGNOSIS — O34219 Maternal care for unspecified type scar from previous cesarean delivery: Secondary | ICD-10-CM

## 2020-02-20 DIAGNOSIS — O24419 Gestational diabetes mellitus in pregnancy, unspecified control: Secondary | ICD-10-CM

## 2020-02-20 DIAGNOSIS — Z3480 Encounter for supervision of other normal pregnancy, unspecified trimester: Secondary | ICD-10-CM

## 2020-02-20 NOTE — Progress Notes (Signed)
OBSTETRICS PRENATAL VIRTUAL VISIT ENCOUNTER NOTE  Provider location: [facility] [place]   I connected with Judy James on 02/20/20 at 11:15 AM EDT by MyChart Video Encounter at home and verified that I am speaking with the correct person using two identifiers.   I discussed the limitations, risks, security and privacy concerns of performing an evaluation and management service virtually and the availability of in person appointments. I also discussed with the patient that there may be a patient responsible charge related to this service. The patient expressed understanding and agreed to proceed. Subjective:  Judy James is a 34 y.o. G3P1011 at [redacted]w[redacted]d being seen today for ongoing prenatal care.  She is currently monitored for the following issues for this high-risk pregnancy and has Migraines; Ovarian dermoid cyst complicating pregnancy, antepartum, unspecified trimester; Supervision of other normal pregnancy, antepartum; History of cesarean delivery, currently pregnant; Obesity affecting pregnancy; and Gestational diabetes mellitus (GDM) affecting pregnancy, antepartum on their problem list.  Patient reports no complaints.  Contractions: Not present.  .  Movement: Present. Denies any leaking of fluid.   The following portions of the patient's history were reviewed and updated as appropriate: allergies, current medications, past family history, past medical history, past social history, past surgical history and problem list.   Objective:   Vitals:   02/20/20 1048  BP: 120/61  Pulse: 72    Fetal Status:     Movement: Present     General:  Alert, oriented and cooperative. Patient is in no acute distress.  Respiratory: Normal respiratory effort, no problems with respiration noted  Mental Status: Normal mood and affect. Normal behavior. Normal judgment and thought content.  Rest of physical exam deferred due to type of encounter  Imaging: [place] MFM OB FOLLOW  UP  Result Date: 01/30/2020 ----------------------------------------------------------------------  OBSTETRICS REPORT                       (Signed Final 01/30/2020 02:11 pm) ---------------------------------------------------------------------- Patient Info  ID #:       [id number]                          D.O.B.:  08/01/1986 (34 yrs)  Name:       Judy James                  Visit Date: 01/30/2020 01:17 pm ---------------------------------------------------------------------- Performed By  Performed By:     Corky Crafts             Ref. Address:     [place], [place]  [place]  Attending:        Tama High MD        [facility]  Referred By:      Merilyn Baba DO ---------------------------------------------------------------------- Orders   #  Description                          Code         Ordered By   1  [place] MFM OB FOLLOW UP                  [id number]     [facility]  ----------------------------------------------------------------------   #  Order #                    Accession #                 Episode #   1  [id number]                  [id number]                  [id number]  ---------------------------------------------------------------------- Indications   Encounter for other antenatal screening        Z36.2   follow-up   Ovarian dermoid cyst complicating              O34.80, D27.9   pregnancy, antepartum (Bilateral)   [redacted] weeks gestation of pregnancy                0000000   Obesity complicating pregnancy, second         O99.212   trimester (pregravid BMI 32)   Previous cesarean delivery, antepartum         O34.219   Gestational diabetes in pregnancy, diet         O24.410   controlled  ---------------------------------------------------------------------- Vital Signs                                                 Height:        5'1" ---------------------------------------------------------------------- Fetal Evaluation  Num Of Fetuses:         1  Fetal Heart Rate(bpm):  133  Cardiac Activity:       Observed  Presentation:           Cephalic  Placenta:               Posterior  P. Cord Insertion:      Previously Visualized  Amniotic Fluid  AFI FV:      Within normal limits  AFI Sum(cm)     %Tile       Largest Pocket(cm)  9.26            10  4.41  RUQ(cm)       RLQ(cm)       LUQ(cm)        LLQ(cm)  4.41          1.46          1.46           1.93 ---------------------------------------------------------------------- Biometry  BPD:      79.9  mm     G. Age:  32w 1d         43  %    CI:        78.62   %    70 - 86                                                          FL/HC:      21.4   %    19.1 - 21.3  HC:       285   mm     G. Age:  31w 2d          6  %    HC/AC:      0.99        0.96 - 1.17  AC:      288.2  mm     G. Age:  32w 6d         73  %    FL/BPD:     76.2   %    71 - 87  FL:       60.9  mm     G. Age:  31w 4d         27  %    FL/AC:      21.1   %    20 - 24  HUM:      55.8  mm     G. Age:  32w 3d         45  %  Est. FW:    1935  gm      4 lb 4 oz     47  % ---------------------------------------------------------------------- OB History  Gravidity:    3         Term:   1        Prem:   0        SAB:   1  TOP:          0       Ectopic:  0        Living: 1 ---------------------------------------------------------------------- Gestational Age  LMP:           32w 4d        Date:  06/16/19                 EDD:   03/22/20  U/S Today:     32w 0d                                        EDD:   03/26/20  Best:          32w 0d     Det. ByLoman Chroman         EDD:   03/26/20                                      (  08/13/19)  ---------------------------------------------------------------------- Anatomy  Cranium:               Appears normal         LVOT:                   Previously seen  Cavum:                 Previously seen        Aortic Arch:            Previously seen  Ventricles:            Previously seen        Ductal Arch:            Previously seen  Choroid Plexus:        Previously seen        Diaphragm:              Appears normal  Cerebellum:            Previously seen        Stomach:                Appears normal, left                                                                        sided  Posterior Fossa:       Previously seen        Abdomen:                Appears normal  Nuchal Fold:           Previously seen        Abdominal Wall:         Previously seen  Face:                  Orbits and profile     Cord Vessels:           Previously seen                         previously seen  Lips:                  Previously seen        Kidneys:                Appear normal  Palate:                Not well visualized    Bladder:                Appears normal  Thoracic:              Appears normal         Spine:                  Previously seen  Heart:                 Previously seen        Upper Extremities:      Previously seen  RVOT:  Previously seen        Lower Extremities:      Previously seen  Other:  Heels previously visualized. 5th digit previously visualized. ---------------------------------------------------------------------- Cervix Uterus Adnexa  Cervix  Not visualized (advanced GA >24wks)  Uterus  No abnormality visualized.  Left Ovary  Size(cm)       3.5  x   2.4    x  2.4       Vol(ml): 10.56  Known dermoid again seen = 1.9x1.5x1.7cm  Right Ovary  Size(cm)       4.9  x   3.7    x  3.3       Vol(ml): 31.33  Known dermoid again seen = 3.9x2.9x2.8cm ---------------------------------------------------------------------- Impression  Patient returns for fetal growth assessment.  Amniotic fluid is   normal good fetal activity seen.  Fetal growth is appropriate  for gestational age.  Bilateral ovarian masses consistent with dermoids are seen  again (measurements above) and they are essentially  unchanged in size.  Patient does not have symptoms  pertaining to the ovarian mass.  We reassured the patient of the findings.  Patient has a recent diagnosis of gestational diabetes that is  well controlled on diet.  Will make a follow-up appointment for  her to return in 4 weeks for fetal growth assessment.  I  informed the patient that if she needs oral hypoglycemics or  insulin treatment, weekly BPP would be recommended. ---------------------------------------------------------------------- Recommendations  -An appointment was made for her to return in 4 weeks for  fetal growth assessment. ----------------------------------------------------------------------                  Tama High, MD Electronically Signed Final Report   01/30/2020 02:11 pm ----------------------------------------------------------------------   Assessment and Plan:  Pregnancy: [id number] at [redacted]w[redacted]d 1. Gestational diabetes mellitus (GDM) affecting pregnancy, antepartum FBS 80-89 Using BabyScripts as she remembers 2 hour pp 120-142 Normal growth recently Continue diet  2. Supervision of other normal pregnancy, antepartum Continue prenatal care.   3. History of cesarean delivery, currently pregnant For TOLAC, consent signed.  Preterm labor symptoms and general obstetric precautions including but not limited to vaginal bleeding, contractions, leaking of fluid and fetal movement were reviewed in detail with the patient. I discussed the assessment and treatment plan with the patient. The patient was provided an opportunity to ask questions and all were answered. The patient agreed with the plan and demonstrated an understanding of the instructions. The patient was advised to call back or seek an in-person office evaluation/go to MAU  at [facility] for any urgent or concerning symptoms. Please refer to After Visit Summary for other counseling recommendations.   I provided 10 minutes of face-to-face time during this encounter.  Return in about 1 week (around 02/27/2020) for [facility], in person needs cultures.  Future Appointments  Date Time Provider [place]  02/27/2020 11:10 AM [place], Marin Shutter, MD [facility] None  02/27/2020  1:10 PM [place] [facility]  02/27/2020  1:15 PM [place], [place] for [facility], [place]

## 2020-02-20 NOTE — Progress Notes (Signed)
I connected with  Judy James on 02/20/20 at 11:15 AM EDT by telephone and verified that I am speaking with the correct person using two identifiers.   I discussed the limitations, risks, security and privacy concerns of performing an evaluation and management service by telephone and the availability of in person appointments. I also discussed with the patient that there may be a patient responsible charge related to this service. The patient expressed understanding and agreed to proceed.  [place], CMA 02/20/2020  10:44 AM

## 2020-02-20 NOTE — Progress Notes (Signed)
CBG 101 

## 2020-02-20 NOTE — Patient Instructions (Signed)

## 2020-02-27 ENCOUNTER — Ambulatory Visit (INDEPENDENT_AMBULATORY_CARE_PROVIDER_SITE_OTHER): Payer: BC Managed Care – PPO | Admitting: Family Medicine

## 2020-02-27 ENCOUNTER — Encounter: Payer: Self-pay | Admitting: *Deleted

## 2020-02-27 ENCOUNTER — Other Ambulatory Visit: Payer: Self-pay

## 2020-02-27 ENCOUNTER — Other Ambulatory Visit (HOSPITAL_COMMUNITY)
Admission: RE | Admit: 2020-02-27 | Discharge: 2020-02-27 | Disposition: A | Discharge status: Home / Self Care | Payer: BC Managed Care – PPO | Source: Ambulatory Visit | Attending: Family Medicine | Admitting: Family Medicine

## 2020-02-27 ENCOUNTER — Ambulatory Visit: Payer: BC Managed Care – PPO | Admitting: *Deleted

## 2020-02-27 ENCOUNTER — Ambulatory Visit (HOSPITAL_BASED_OUTPATIENT_CLINIC_OR_DEPARTMENT_OTHER): Payer: BC Managed Care – PPO

## 2020-02-27 VITALS — BP 109/68 | HR 76 | Wt 192.8 lb

## 2020-02-27 DIAGNOSIS — O24410 Gestational diabetes mellitus in pregnancy, diet controlled: Secondary | ICD-10-CM

## 2020-02-27 DIAGNOSIS — O99213 Obesity complicating pregnancy, third trimester: Secondary | ICD-10-CM

## 2020-02-27 DIAGNOSIS — B373 Candidiasis of vulva and vagina: Secondary | ICD-10-CM

## 2020-02-27 DIAGNOSIS — D279 Benign neoplasm of unspecified ovary: Secondary | ICD-10-CM | POA: Diagnosis not present

## 2020-02-27 DIAGNOSIS — E669 Obesity, unspecified: Secondary | ICD-10-CM

## 2020-02-27 DIAGNOSIS — Z3483 Encounter for supervision of other normal pregnancy, third trimester: Secondary | ICD-10-CM | POA: Insufficient documentation

## 2020-02-27 DIAGNOSIS — O34219 Maternal care for unspecified type scar from previous cesarean delivery: Secondary | ICD-10-CM

## 2020-02-27 DIAGNOSIS — O3483 Maternal care for other abnormalities of pelvic organs, third trimester: Secondary | ICD-10-CM | POA: Diagnosis not present

## 2020-02-27 DIAGNOSIS — Z3A36 36 weeks gestation of pregnancy: Secondary | ICD-10-CM

## 2020-02-27 DIAGNOSIS — B3731 Acute candidiasis of vulva and vagina: Secondary | ICD-10-CM

## 2020-02-27 DIAGNOSIS — Z3480 Encounter for supervision of other normal pregnancy, unspecified trimester: Secondary | ICD-10-CM | POA: Diagnosis not present

## 2020-02-27 DIAGNOSIS — O24419 Gestational diabetes mellitus in pregnancy, unspecified control: Secondary | ICD-10-CM

## 2020-02-27 NOTE — Progress Notes (Signed)
   PRENATAL VISIT NOTE  Subjective:  Judy James is a 34 y.o. G3P1011 at [redacted]w[redacted]d being seen today for ongoing prenatal care.  She is currently monitored for the following issues for this high-risk pregnancy and has Migraines; Ovarian dermoid cyst complicating pregnancy, antepartum, unspecified trimester; Supervision of other normal pregnancy, antepartum; History of cesarean delivery, currently pregnant; Obesity affecting pregnancy; and Gestational diabetes mellitus (GDM) affecting pregnancy, antepartum on their problem list.  Patient reports no complaints.  Contractions: Irritability. Vag. Bleeding: None.  Movement: Present. Denies leaking of fluid.   The following portions of the patient's history were reviewed and updated as appropriate: allergies, current medications, past family history, past medical history, past social history, past surgical history and problem list.   Objective:   Vitals:   02/27/20 1129  BP: 109/68  Pulse: 76  Weight: 192 lb 12.8 oz (87.5 kg)    Fetal Status: Fetal Heart Rate (bpm): 140 Fundal Height: 38 cm Movement: Present  Presentation: Vertex  General:  Alert, oriented and cooperative. Patient is in no acute distress.  Skin: Skin is warm and dry. No rash noted.   Cardiovascular: Normal heart rate noted  Respiratory: Normal respiratory effort, no problems with respiration noted  Abdomen: Soft, gravid, appropriate for gestational age.  Pain/Pressure: Present     Pelvic: Cervical exam performed in the presence of a chaperone Dilation: Closed Effacement (%): Thick Station: Ballotable  Extremities: Normal range of motion.  Edema: Mild pitting, slight indentation  Mental Status: Normal mood and affect. Normal behavior. Normal judgment and thought content.   Assessment and Plan:  Pregnancy: G3P1011 at [redacted]w[redacted]d 1. Supervision of other normal pregnancy, antepartum - RTC in 1 week - Strep Gp B Culture+Rflx - Cervicovaginal ancillary only( [place]) -  boy-inpatient, breast, none   2. Gestational diabetes mellitus (GDM) affecting pregnancy, antepartum - Diet controlled - Some fasting levels have been > 100  - Not checking sugars TID - Most 1 hour PP are < 140 - Discussed importance of glucose control and patient agreeable to be more strict with diet as she knows she has not been these last few weeks; discussed starting Metformin qhs if many values out of range at next visit  - EFW 47% on 4/9; repeat US today   3. History of cesarean delivery, currently pregnant - Desires TOLAC: consent signed 12/26/2018  Preterm labor symptoms and general obstetric precautions including but not limited to vaginal bleeding, contractions, leaking of fluid and fetal movement were reviewed in detail with the patient. Please refer to After Visit Summary for other counseling recommendations.   Return in about 1 week (around 03/05/2020) for [facility]; in-person or virtual per patient request if owns BP cuff.  Future Appointments  Date Time Provider [place]  02/27/2020  1:10 PM [facility] NURSE [facility]  02/27/2020  1:15 PM [facility] US4 [facility]: [place] Rhonda W    Chauncey Mann, MD

## 2020-02-27 NOTE — Progress Notes (Signed)
Patient presents for ROB and GBS. Patient has no concerns today.

## 2020-03-01 LAB — CERVICOVAGINAL ANCILLARY ONLY
Bacterial Vaginitis (gardnerella): NEGATIVE
Candida Glabrata: NEGATIVE
Candida Vaginitis: POSITIVE — AB
Chlamydia: NEGATIVE
Comment: NEGATIVE
Comment: NEGATIVE
Comment: NEGATIVE
Comment: NEGATIVE
Comment: NEGATIVE
Comment: NORMAL
Neisseria Gonorrhea: NEGATIVE
Trichomonas: NEGATIVE

## 2020-03-01 MED ORDER — TERCONAZOLE 0.8 % VA CREA: 1.0000 | TOPICAL_CREAM | Freq: Every day | VAGINAL | 0 refills | Status: DC

## 2020-03-01 NOTE — Addendum Note (Signed)
Addended by: Barrington Ellison on: 03/01/2020 07:38 PM   Modules accepted: Orders

## 2020-03-02 LAB — STREP GP B CULTURE+RFLX: Strep Gp B Culture+Rflx: NEGATIVE

## 2020-03-08 ENCOUNTER — Telehealth (INDEPENDENT_AMBULATORY_CARE_PROVIDER_SITE_OTHER): Payer: BC Managed Care – PPO | Admitting: Obstetrics & Gynecology

## 2020-03-08 ENCOUNTER — Other Ambulatory Visit: Payer: Self-pay | Admitting: Family Medicine

## 2020-03-08 ENCOUNTER — Encounter: Payer: Self-pay | Admitting: Obstetrics & Gynecology

## 2020-03-08 VITALS — BP 115/64 | HR 78

## 2020-03-08 DIAGNOSIS — O24419 Gestational diabetes mellitus in pregnancy, unspecified control: Secondary | ICD-10-CM

## 2020-03-08 DIAGNOSIS — Z3480 Encounter for supervision of other normal pregnancy, unspecified trimester: Secondary | ICD-10-CM

## 2020-03-08 DIAGNOSIS — Z3A38 38 weeks gestation of pregnancy: Secondary | ICD-10-CM

## 2020-03-08 DIAGNOSIS — O34219 Maternal care for unspecified type scar from previous cesarean delivery: Secondary | ICD-10-CM

## 2020-03-08 NOTE — Progress Notes (Signed)
Pt is on the phone preparing for virtual visit with provider. [redacted]w[redacted]d.

## 2020-03-08 NOTE — Patient Instructions (Signed)
Vaginal Birth After Cesarean Delivery  Vaginal birth after cesarean delivery (VBAC) is giving birth vaginally after previously delivering a baby through a cesarean section (C-section). A VBAC may be a safe option for you, depending on your health and other factors. It is important to discuss VBAC with your health care provider early in your pregnancy so you can understand the risks, benefits, and options. Having these discussions early will give you time to make your birth plan. Who are the best candidates for VBAC? The best candidates for VBAC are women who:  Have had one or two prior cesarean deliveries, and the incision made during the delivery was horizontal (low transverse).  Do not have a vertical (classical) scar on their uterus.  Have not had a tear in the wall of their uterus (uterine rupture).  Plan to have more pregnancies. A VBAC is also more likely to be successful:  In women who have previously given birth vaginally.  When labor starts by itself (spontaneously) before the due date. What are the benefits of VBAC? The benefits of delivering your baby vaginally instead of by a cesarean delivery include:  A shorter hospital stay.  A faster recovery time.  Less pain.  Avoiding risks associated with major surgery, such as infection and blood clots.  Less blood loss and less need for donated blood (transfusions). What are the risks of VBAC? The main risk of attempting a VBAC is that it may fail, forcing your health care provider to deliver your baby by a C-section. Other risks are rare and include:  Tearing (rupture) of the scar from a past cesarean delivery.  Other risks associated with vaginal deliveries. If a repeat cesarean delivery is needed, the risks include:  Blood loss.  Infection.  Blood clot.  Damage to surrounding organs.  Removal of the uterus (hysterectomy), if it is damaged.  Placenta problems in future pregnancies. What else should I know  about my options? Delivering a baby through a VBAC is similar to having a normal spontaneous vaginal delivery. Therefore, it is safe:  To try with twins.  For your health care provider to try to turn the baby from a breech position (external cephalic version) during labor.  With epidural analgesia for pain relief. Consider where you would like to deliver your baby. VBAC should be attempted in facilities where an emergency cesarean delivery can be performed. VBAC is not recommended for home births. Any changes in your health or your baby's health during your pregnancy may make it necessary to change your initial decision about VBAC. Your health care provider may recommend that you do not attempt a VBAC if:  Your baby's suspected weight is 8.8 lb (4 kg) or more.  You have preeclampsia. This is a condition that causes high blood pressure along with other symptoms, such as swelling and headaches.  You will have VBAC less than 19 months after your cesarean delivery.  You are past your due date.  You need to have labor started (induced) because your cervix is not ready for labor (unfavorable). Where to find more information  American Pregnancy Association: americanpregnancy.org  [employer] of Obstetricians and Gynecologists: acog.org Summary  Vaginal birth after cesarean delivery (VBAC) is giving birth vaginally after previously delivering a baby through a cesarean section (C-section). A VBAC may be a safe option for you, depending on your health and other factors.  Discuss VBAC with your health care provider early in your pregnancy so you can understand the risks, benefits, options, and  have plenty of time to make your birth plan.  The main risk of attempting a VBAC is that it may fail, forcing your health care provider to deliver your baby by a C-section. Other risks are rare. This information is not intended to replace advice given to you by your health care provider. Make sure  you discuss any questions you have with your health care provider. Document Revised: 02/04/2019 Document Reviewed: 01/16/2017 Elsevier Patient Education  2020 Elsevier Inc.  

## 2020-03-08 NOTE — Progress Notes (Signed)
   TELEHEALTH VIRTUAL OBSTETRICS VISIT ENCOUNTER NOTE  I connected with Judy James on 03/08/20 at  4:15 PM EDT by telephone at home and verified that I am speaking with the correct person using two identifiers.   I discussed the limitations, risks, security and privacy concerns of performing an evaluation and management service by telephone and the availability of in person appointments. I also discussed with the patient that there may be a patient responsible charge related to this service. The patient expressed understanding and agreed to proceed.  Subjective:  Judy James is a 34 y.o. G3P1011 at [redacted]w[redacted]d being followed for ongoing prenatal care.  She is currently monitored for the following issues for this high-risk pregnancy and has Migraines; Ovarian dermoid cyst complicating pregnancy, antepartum, unspecified trimester; Supervision of other normal pregnancy, antepartum; History of cesarean delivery, currently pregnant; Obesity affecting pregnancy; and Gestational diabetes mellitus (GDM) affecting pregnancy, antepartum on their problem list.  Patient reports no complaints. Reports fetal movement. Denies any contractions, bleeding or leaking of fluid.   The following portions of the patient's history were reviewed and updated as appropriate: allergies, current medications, past family history, past medical history, past social history, past surgical history and problem list.   Objective:   General:  Alert, oriented and cooperative.   Mental Status: Normal mood and affect perceived. Normal judgment and thought content.  Rest of physical exam deferred due to type of encounter  Assessment and Plan:  Pregnancy: G3P1011 at [redacted]w[redacted]d 1. Gestational diabetes mellitus (GDM) affecting pregnancy, antepartum FBS 50% above 100, PP up to 150, needs IOL 39 week for difficult control. Foley insertion 5/23 in MAU  2. Supervision of other normal pregnancy, antepartum 38 weeks by sure LMP = 7.4 week  [place]  3. History of cesarean delivery, currently pregnant TOLAC planned,   Term labor symptoms and general obstetric precautions including but not limited to vaginal bleeding, contractions, leaking of fluid and fetal movement were reviewed in detail with the patient.  I discussed the assessment and treatment plan with the patient. The patient was provided an opportunity to ask questions and all were answered. The patient agreed with the plan and demonstrated an understanding of the instructions. The patient was advised to call back or seek an in-person office evaluation/go to MAU at [facility] for any urgent or concerning symptoms. Please refer to After Visit Summary for other counseling recommendations.   I provided 15 minutes of non-face-to-face time during this encounter.  Return in 1 week (on 03/15/2020), or if symptoms worsen or fail to improve, for postpartum.  Future Appointments  Date Time Provider [place]  03/08/2020  4:15 PM [place] Mode, MD [place] None    Emeterio Reeve, [place] for [place], [place]

## 2020-03-09 ENCOUNTER — Telehealth (HOSPITAL_COMMUNITY): Payer: Self-pay | Admitting: *Deleted

## 2020-03-09 ENCOUNTER — Encounter (HOSPITAL_COMMUNITY): Payer: Self-pay | Admitting: *Deleted

## 2020-03-09 NOTE — Telephone Encounter (Signed)
Preadmission screen  

## 2020-03-13 ENCOUNTER — Other Ambulatory Visit (HOSPITAL_COMMUNITY)
Admission: RE | Admit: 2020-03-13 | Discharge: 2020-03-13 | Disposition: A | Discharge status: Home / Self Care | Payer: BC Managed Care – PPO | Source: Ambulatory Visit | Attending: Family Medicine | Admitting: Family Medicine

## 2020-03-13 DIAGNOSIS — D271 Benign neoplasm of left ovary: Secondary | ICD-10-CM | POA: Diagnosis not present

## 2020-03-13 DIAGNOSIS — O411230 Chorioamnionitis, third trimester, not applicable or unspecified: Secondary | ICD-10-CM | POA: Diagnosis not present

## 2020-03-13 DIAGNOSIS — G43909 Migraine, unspecified, not intractable, without status migrainosus: Secondary | ICD-10-CM | POA: Diagnosis not present

## 2020-03-13 DIAGNOSIS — D279 Benign neoplasm of unspecified ovary: Secondary | ICD-10-CM | POA: Diagnosis not present

## 2020-03-13 DIAGNOSIS — O99891 Other specified diseases and conditions complicating pregnancy: Secondary | ICD-10-CM | POA: Diagnosis not present

## 2020-03-13 DIAGNOSIS — O34211 Maternal care for low transverse scar from previous cesarean delivery: Secondary | ICD-10-CM | POA: Diagnosis not present

## 2020-03-13 DIAGNOSIS — O99214 Obesity complicating childbirth: Secondary | ICD-10-CM | POA: Diagnosis not present

## 2020-03-13 DIAGNOSIS — Z3A00 Weeks of gestation of pregnancy not specified: Secondary | ICD-10-CM | POA: Diagnosis not present

## 2020-03-13 DIAGNOSIS — D270 Benign neoplasm of right ovary: Secondary | ICD-10-CM | POA: Diagnosis not present

## 2020-03-13 DIAGNOSIS — Z20822 Contact with and (suspected) exposure to covid-19: Secondary | ICD-10-CM | POA: Diagnosis not present

## 2020-03-13 DIAGNOSIS — Z880 Allergy status to penicillin: Secondary | ICD-10-CM | POA: Diagnosis not present

## 2020-03-13 DIAGNOSIS — O99892 Other specified diseases and conditions complicating childbirth: Secondary | ICD-10-CM | POA: Diagnosis not present

## 2020-03-13 DIAGNOSIS — O24420 Gestational diabetes mellitus in childbirth, diet controlled: Secondary | ICD-10-CM | POA: Diagnosis not present

## 2020-03-13 DIAGNOSIS — O34219 Maternal care for unspecified type scar from previous cesarean delivery: Secondary | ICD-10-CM | POA: Diagnosis not present

## 2020-03-13 DIAGNOSIS — O99353 Diseases of the nervous system complicating pregnancy, third trimester: Secondary | ICD-10-CM | POA: Diagnosis not present

## 2020-03-13 DIAGNOSIS — Z01812 Encounter for preprocedural laboratory examination: Secondary | ICD-10-CM | POA: Insufficient documentation

## 2020-03-13 DIAGNOSIS — Z3A39 39 weeks gestation of pregnancy: Secondary | ICD-10-CM | POA: Diagnosis not present

## 2020-03-13 DIAGNOSIS — Z7982 Long term (current) use of aspirin: Secondary | ICD-10-CM | POA: Diagnosis not present

## 2020-03-13 DIAGNOSIS — N179 Acute kidney failure, unspecified: Secondary | ICD-10-CM | POA: Diagnosis not present

## 2020-03-13 DIAGNOSIS — O24429 Gestational diabetes mellitus in childbirth, unspecified control: Secondary | ICD-10-CM | POA: Diagnosis not present

## 2020-03-13 LAB — SARS CORONAVIRUS 2 (TAT 6-24 HRS): SARS Coronavirus 2: NEGATIVE

## 2020-03-14 ENCOUNTER — Inpatient Hospital Stay (EMERGENCY_DEPARTMENT_HOSPITAL)
Admission: AD | Admit: 2020-03-14 | Discharge: 2020-03-14 | Disposition: A | Discharge status: Home / Self Care | Payer: BC Managed Care – PPO | Source: Home / Self Care | Attending: Obstetrics and Gynecology | Admitting: Obstetrics and Gynecology

## 2020-03-14 ENCOUNTER — Other Ambulatory Visit: Payer: Self-pay

## 2020-03-14 ENCOUNTER — Encounter (HOSPITAL_COMMUNITY): Payer: Self-pay | Admitting: Obstetrics and Gynecology

## 2020-03-14 DIAGNOSIS — Z3A38 38 weeks gestation of pregnancy: Secondary | ICD-10-CM | POA: Insufficient documentation

## 2020-03-14 DIAGNOSIS — O24419 Gestational diabetes mellitus in pregnancy, unspecified control: Secondary | ICD-10-CM

## 2020-03-14 DIAGNOSIS — D279 Benign neoplasm of unspecified ovary: Secondary | ICD-10-CM

## 2020-03-14 DIAGNOSIS — N858 Other specified noninflammatory disorders of uterus: Secondary | ICD-10-CM

## 2020-03-14 DIAGNOSIS — G43909 Migraine, unspecified, not intractable, without status migrainosus: Secondary | ICD-10-CM

## 2020-03-14 DIAGNOSIS — E669 Obesity, unspecified: Secondary | ICD-10-CM

## 2020-03-14 DIAGNOSIS — O99353 Diseases of the nervous system complicating pregnancy, third trimester: Secondary | ICD-10-CM | POA: Diagnosis not present

## 2020-03-14 DIAGNOSIS — O99891 Other specified diseases and conditions complicating pregnancy: Secondary | ICD-10-CM

## 2020-03-14 DIAGNOSIS — O471 False labor at or after 37 completed weeks of gestation: Secondary | ICD-10-CM | POA: Insufficient documentation

## 2020-03-14 DIAGNOSIS — O99213 Obesity complicating pregnancy, third trimester: Secondary | ICD-10-CM

## 2020-03-14 DIAGNOSIS — Z3689 Encounter for other specified antenatal screening: Secondary | ICD-10-CM

## 2020-03-14 DIAGNOSIS — O34219 Maternal care for unspecified type scar from previous cesarean delivery: Secondary | ICD-10-CM

## 2020-03-14 NOTE — MAU Provider Note (Addendum)
Patient Judy James is a 34 y.o. G3P1011  at [redacted]w[redacted]d here for outpatient Foley Bulb placement.  She is currently monitored for the following issues for this high-risk pregnancy and has Migraines; Ovarian dermoid cyst complicating pregnancy, antepartum, unspecified trimester; Supervision of other normal pregnancy, antepartum; History of cesarean delivery, currently pregnant; Obesity affecting pregnancy; and Gestational diabetes mellitus (GDM) affecting pregnancy, antepartum on their problem list.  Patient reports no complaints.   . Vag. Bleeding: None.   . Denies leaking of fluid.   The following portions of the patient's history were reviewed and updated as appropriate: allergies, current medications, past family history, past medical history, past social history, past surgical history and problem list. Problem list updated.  Objective:   Vitals:   03/14/20 1703 03/14/20 1819  BP: 120/61 115/66  Pulse: 87 74  Resp: 18 16  Temp: 98.1 F (36.7 C) 98.5 F (36.9 C)  TempSrc:  Oral  SpO2:  98%    Fetal Status:           General:  Alert, oriented and cooperative. Patient is in no acute distress.  Skin: Skin is warm and dry. No rash noted.   Cardiovascular: Normal heart rate noted  Respiratory: Normal respiratory effort, no problems with respiration noted  Abdomen: Soft, gravid, appropriate for gestational age.        Pelvic: Cervical exam performed FT, long, posterior        Extremities: Normal range of motion.     Mental Status:  Normal mood and affect. Normal behavior. Normal judgment and thought content.  Procedure: Patient informed of R/B/A of procedure. NST was performed and was reactive prior to procedure. NST:  EFM: Baseline: 130 bpm, present acel, no decel, mod variabilty, q 1-5 contractions.  Toco: irregular contractions  Procedure done to begin ripening of the cervix prior to admission for induction of labor. Appropriate time out taken. The patient was placed in the  lithotomy position and the cervix brought into view with sterile speculum. A ring forcep was used to guide the [place] cathetier through the internal os of the cervix. Foley Balloon filled with 60cc of normal saline. Plug inserted into end of the foley. Foley placed on tension and taped to medial thigh.  NST:  EFM Baseline: 125 bpm; mod var, present acel, no decels, no contractions.    There were no signs of tachysystole or hypertonus. All equipment was removed and accounted for. The patient tolerated the procedure well.  Assessment and Plan:  Pregnancy: G3P1011 at [redacted]w[redacted]d  S/p Outpatient placement of foley balloon catheter for cervical ripening. Induction of labor scheduled for tomorrow pending phone call.  Reassuring FHR tracing with no concerns at present. Warning signs given to patient to include return to MAU for heavy vaginal bleeding, Rupture of membranes, painful uterine contractions q 5 mins or less, severe abdominal discomfort, decreased fetal movement.  -Wait for phone call for IOL tomorrow. Strict return instructions given.   Mervyn Skeeters Phuc Kluttz 03/14/2020, 6:32 PM

## 2020-03-14 NOTE — Discharge Instructions (Signed)
Augmentation of Labor  Augmentation of labor is when steps are taken to stimulate and strengthen contractions of the uterus during labor. This may be done when contractions have slowed down or stopped, delaying progress of labor and delivery of the baby. Before beginning augmentation of labor, your health care provider will evaluate your condition, your baby's condition, the size and position of your baby, and the size of your birth canal. What are some reasons for labor augmentation? Augmentation of labor may be needed when:  You are in labor but your contractions are weak or irregular.  You are in labor but your contractions have stopped. What methods are used for labor augmentation? Labor augmentation may be done by:  Giving medicine that stimulates contractions (oxytocin). This is given through an IV tube that is inserted into one of your veins.  Breaking the fluid-filled sac that surrounds the fetus (amniotic sac). What are the risks associated with labor augmentation? Some risks of labor augmentation include:  Too much stimulation of the contractions, resulting in continuous, prolonged, or very strong contractions.  Increased risk of infection for you and your baby.  Tearing (rupture) of the uterus.  Breaking off (abruption) of the placenta.  Increased risk of cesarean, forceps, or vacuum delivery.  Excessive bleeding after delivery (postpartum hemorrhage).  Death of the baby (fetal death). What are some reasons for not doing labor augmentation? Augmentation of labor should not be done if:  The baby is too big for the birth canal. This can be confirmed with an ultrasound.  The umbilical cord drops in front of the baby's head or breech part (prolapsed cord).  You have had a cesarean delivery and you had a vertical incision or you do not know what type of incision you had.  You have had surgery on or into your uterus.  You have an active herpes outbreak.  You have  cervical cancer.  The placenta blocks the opening of the cervix (placenta previa) or you have other condition that is blocking the cervix or vaginal outlet.  The baby is lying sideways.  Your pelvis is will not permit the passage of the baby.  You are carrying more than two babies. Summary  Augmentation of labor is when steps are taken to stimulate and strengthen contractions of the uterus during labor. This may be done when contractions have slowed down or stopped, delaying progress of labor and delivery of the baby.  Labor augmentation may be done using medicine to stimulate contractions (oxytocin) or by breaking the fluid-filled sac that surrounds the fetus (amniotic sac).  Labor should not be augmented if you have had a cesarean delivery and you had a vertical incision or you do not know what type of incision you had. This information is not intended to replace advice given to you by your health care provider. Make sure you discuss any questions you have with your health care provider. Document Revised: 08/05/2019 Document Reviewed: 11/13/2016 Elsevier Patient Education  2020 Elsevier Inc.  

## 2020-03-14 NOTE — MAU Note (Signed)
Pt here for foley bulb placement. Good fetal movement reported. Denies any pain or discomfort a t this time

## 2020-03-15 ENCOUNTER — Inpatient Hospital Stay (HOSPITAL_COMMUNITY): Payer: BC Managed Care – PPO | Admitting: Anesthesiology

## 2020-03-15 ENCOUNTER — Inpatient Hospital Stay (HOSPITAL_COMMUNITY): Payer: BC Managed Care – PPO

## 2020-03-15 ENCOUNTER — Inpatient Hospital Stay (HOSPITAL_COMMUNITY)
Admission: AD | Admit: 2020-03-15 | Discharge: 2020-03-18 | DRG: 786 | Disposition: A | Discharge status: Home / Self Care | Payer: BC Managed Care – PPO | Attending: Family Medicine | Admitting: Family Medicine

## 2020-03-15 ENCOUNTER — Other Ambulatory Visit: Payer: Self-pay

## 2020-03-15 ENCOUNTER — Encounter (HOSPITAL_COMMUNITY): Payer: Self-pay | Admitting: Obstetrics & Gynecology

## 2020-03-15 DIAGNOSIS — N179 Acute kidney failure, unspecified: Secondary | ICD-10-CM | POA: Diagnosis not present

## 2020-03-15 DIAGNOSIS — O3480 Maternal care for other abnormalities of pelvic organs, unspecified trimester: Secondary | ICD-10-CM | POA: Diagnosis present

## 2020-03-15 DIAGNOSIS — O99892 Other specified diseases and conditions complicating childbirth: Secondary | ICD-10-CM | POA: Diagnosis present

## 2020-03-15 DIAGNOSIS — Z20822 Contact with and (suspected) exposure to covid-19: Secondary | ICD-10-CM | POA: Diagnosis present

## 2020-03-15 DIAGNOSIS — Z3480 Encounter for supervision of other normal pregnancy, unspecified trimester: Secondary | ICD-10-CM

## 2020-03-15 DIAGNOSIS — D270 Benign neoplasm of right ovary: Secondary | ICD-10-CM | POA: Diagnosis present

## 2020-03-15 DIAGNOSIS — O34219 Maternal care for unspecified type scar from previous cesarean delivery: Secondary | ICD-10-CM | POA: Diagnosis not present

## 2020-03-15 DIAGNOSIS — O99214 Obesity complicating childbirth: Secondary | ICD-10-CM | POA: Diagnosis present

## 2020-03-15 DIAGNOSIS — G43909 Migraine, unspecified, not intractable, without status migrainosus: Secondary | ICD-10-CM | POA: Diagnosis present

## 2020-03-15 DIAGNOSIS — Z3A39 39 weeks gestation of pregnancy: Secondary | ICD-10-CM | POA: Diagnosis not present

## 2020-03-15 DIAGNOSIS — Z3A00 Weeks of gestation of pregnancy not specified: Secondary | ICD-10-CM | POA: Diagnosis not present

## 2020-03-15 DIAGNOSIS — Z880 Allergy status to penicillin: Secondary | ICD-10-CM

## 2020-03-15 DIAGNOSIS — Z7982 Long term (current) use of aspirin: Secondary | ICD-10-CM | POA: Diagnosis not present

## 2020-03-15 DIAGNOSIS — O24420 Gestational diabetes mellitus in childbirth, diet controlled: Principal | ICD-10-CM | POA: Diagnosis present

## 2020-03-15 DIAGNOSIS — D271 Benign neoplasm of left ovary: Secondary | ICD-10-CM | POA: Diagnosis present

## 2020-03-15 DIAGNOSIS — O411230 Chorioamnionitis, third trimester, not applicable or unspecified: Secondary | ICD-10-CM | POA: Diagnosis present

## 2020-03-15 DIAGNOSIS — O411290 Chorioamnionitis, unspecified trimester, not applicable or unspecified: Secondary | ICD-10-CM

## 2020-03-15 DIAGNOSIS — O24429 Gestational diabetes mellitus in childbirth, unspecified control: Secondary | ICD-10-CM | POA: Diagnosis not present

## 2020-03-15 DIAGNOSIS — O34211 Maternal care for low transverse scar from previous cesarean delivery: Secondary | ICD-10-CM | POA: Diagnosis present

## 2020-03-15 DIAGNOSIS — O24419 Gestational diabetes mellitus in pregnancy, unspecified control: Secondary | ICD-10-CM | POA: Diagnosis present

## 2020-03-15 DIAGNOSIS — O99210 Obesity complicating pregnancy, unspecified trimester: Secondary | ICD-10-CM | POA: Diagnosis present

## 2020-03-15 LAB — CBC
HCT: 37.2 % (ref 36.0–46.0)
Hemoglobin: 12.4 g/dL (ref 12.0–15.0)
MCH: 29.3 pg (ref 26.0–34.0)
MCHC: 33.3 g/dL (ref 30.0–36.0)
MCV: 87.9 fL (ref 80.0–100.0)
Platelets: 169 10*3/uL (ref 150–400)
RBC: 4.23 MIL/uL (ref 3.87–5.11)
RDW: 13.7 % (ref 11.5–15.5)
WBC: 9.2 10*3/uL (ref 4.0–10.5)
nRBC: 0 % (ref 0.0–0.2)

## 2020-03-15 LAB — TYPE AND SCREEN
ABO/RH(D): O POS
Antibody Screen: NEGATIVE

## 2020-03-15 LAB — GLUCOSE, CAPILLARY
Glucose-Capillary: 91 mg/dL (ref 70–99)
Glucose-Capillary: 159 mg/dL — ABNORMAL HIGH (ref 70–99)
Glucose-Capillary: 73 mg/dL (ref 70–99)
Glucose-Capillary: 104 mg/dL — ABNORMAL HIGH (ref 70–99)

## 2020-03-15 LAB — ABO/RH: ABO/RH(D): O POS

## 2020-03-15 LAB — RPR: RPR Ser Ql: NONREACTIVE

## 2020-03-15 MED ORDER — EPHEDRINE 5 MG/ML INJ: 10.0000 mg | INTRAVENOUS | Status: DC | PRN

## 2020-03-15 MED ORDER — TERBUTALINE SULFATE 1 MG/ML IJ SOLN: 0.2500 mg | Freq: Once | INTRAMUSCULAR | Status: DC | PRN

## 2020-03-15 MED ORDER — OXYTOCIN BOLUS FROM INFUSION: 500.0000 mL | Freq: Once | INTRAVENOUS | Status: DC

## 2020-03-15 MED ORDER — LACTATED RINGERS IV SOLN
500.0000 mL | Freq: Once | INTRAVENOUS | Status: AC
  Administered 2020-03-15: 500 mL via INTRAVENOUS

## 2020-03-15 MED ORDER — SOD CITRATE-CITRIC ACID 500-334 MG/5ML PO SOLN
30.0000 mL | ORAL | Status: DC | PRN
  Administered 2020-03-16: 30 mL via ORAL
  Filled 2020-03-15: qty 30

## 2020-03-15 MED ORDER — LIDOCAINE HCL (PF) 1 % IJ SOLN: 30.0000 mL | INTRAMUSCULAR | Status: DC | PRN

## 2020-03-15 MED ORDER — OXYTOCIN 40 UNITS IN NORMAL SALINE INFUSION - SIMPLE MED
2.5000 [IU]/h | INTRAVENOUS | Status: DC
  Administered 2020-03-16: 40 [IU] via INTRAVENOUS
  Filled 2020-03-15: qty 1000

## 2020-03-15 MED ORDER — PHENYLEPHRINE 40 MCG/ML (10ML) SYRINGE FOR IV PUSH (FOR BLOOD PRESSURE SUPPORT)
80.0000 ug | PREFILLED_SYRINGE | INTRAVENOUS | Status: DC | PRN
  Filled 2020-03-15 (×2): qty 10

## 2020-03-15 MED ORDER — FENTANYL CITRATE (PF) 100 MCG/2ML IJ SOLN: 100.0000 ug | INTRAMUSCULAR | Status: DC

## 2020-03-15 MED ORDER — OXYCODONE-ACETAMINOPHEN 5-325 MG PO TABS: 2.0000 | ORAL_TABLET | ORAL | Status: DC | PRN

## 2020-03-15 MED ORDER — OXYCODONE-ACETAMINOPHEN 5-325 MG PO TABS: 1.0000 | ORAL_TABLET | ORAL | Status: DC | PRN

## 2020-03-15 MED ORDER — DIPHENHYDRAMINE HCL 50 MG/ML IJ SOLN: 12.5000 mg | INTRAMUSCULAR | Status: DC | PRN

## 2020-03-15 MED ORDER — OXYTOCIN 40 UNITS IN NORMAL SALINE INFUSION - SIMPLE MED
1.0000 m[IU]/min | INTRAVENOUS | Status: DC
  Administered 2020-03-15: 2 m[IU]/min via INTRAVENOUS
  Administered 2020-03-16: 12 m[IU]/min via INTRAVENOUS

## 2020-03-15 MED ORDER — LACTATED RINGERS IV SOLN: INTRAVENOUS | Status: DC

## 2020-03-15 MED ORDER — FENTANYL-BUPIVACAINE-NACL 0.5-0.125-0.9 MG/250ML-% EP SOLN
12.0000 mL/h | EPIDURAL | Status: DC | PRN
  Filled 2020-03-15: qty 250

## 2020-03-15 MED ORDER — FENTANYL CITRATE (PF) 100 MCG/2ML IJ SOLN
100.0000 ug | INTRAMUSCULAR | Status: DC | PRN
  Administered 2020-03-15: 100 ug via INTRAVENOUS
  Filled 2020-03-15: qty 2

## 2020-03-15 MED ORDER — LACTATED RINGERS IV SOLN: 500.0000 mL | INTRAVENOUS | Status: DC | PRN

## 2020-03-15 MED ORDER — PHENYLEPHRINE 40 MCG/ML (10ML) SYRINGE FOR IV PUSH (FOR BLOOD PRESSURE SUPPORT)
80.0000 ug | PREFILLED_SYRINGE | INTRAVENOUS | Status: DC | PRN
  Administered 2020-03-16: 80 ug via INTRAVENOUS

## 2020-03-15 MED ORDER — INSULIN ASPART 100 UNIT/ML ~~LOC~~ SOLN
2.0000 [IU] | Freq: Once | SUBCUTANEOUS | Status: AC
  Administered 2020-03-15: 2 [IU] via SUBCUTANEOUS

## 2020-03-15 MED ORDER — ONDANSETRON HCL 4 MG/2ML IJ SOLN
4.0000 mg | Freq: Four times a day (QID) | INTRAMUSCULAR | Status: DC | PRN
  Administered 2020-03-16 (×2): 4 mg via INTRAVENOUS
  Filled 2020-03-15 (×2): qty 2

## 2020-03-15 MED ORDER — ACETAMINOPHEN 325 MG PO TABS: 650.0000 mg | ORAL_TABLET | ORAL | Status: DC | PRN

## 2020-03-15 NOTE — Progress Notes (Signed)
LABOR PROGRESS NOTE  LIDWINA SHEFFLER is a 34 y.o. G3P1011 at [redacted]w[redacted]d  admitted for TOLAC with IOL.  Subjective: Foley bulb out   Objective: BP 113/65   Pulse 77   Temp 98.3 F (36.8 C) (Oral)   Resp 16   Ht 5\' 1"  (1.549 m)   Wt 89.2 kg   LMP 06/16/2019 (Exact Date)   BMI 37.17 kg/m  or  Vitals:   03/15/20 1737 03/15/20 1811 03/15/20 1840 03/15/20 1940  BP: (!) 102/50 (!) 93/47 120/66 113/65  Pulse: 80 78 67 77  Resp: 16   16  Temp:   98.3 F (36.8 C)   TempSrc:   Oral   Weight:      Height:       Dilation: 4.5 Effacement (%): 70 Station: -2 Presentation: Vertex Exam by:: Dr. Dione Plover FHT: baseline rate 120, moderate varibility, positive acel, absent decel Toco: contractions every 2-3 minutes   Labs: Lab Results  Component Value Date   WBC 9.2 03/15/2020   HGB 12.4 03/15/2020   HCT 37.2 03/15/2020   MCV 87.9 03/15/2020   PLT 169 03/15/2020    Patient Active Problem List   Diagnosis Date Noted  . Gestational diabetes mellitus (GDM) affecting pregnancy, antepartum 12/29/2019  . Obesity affecting pregnancy 10/31/2019  . Supervision of other normal pregnancy, antepartum 09/05/2019  . Ovarian dermoid cyst complicating pregnancy, antepartum, unspecified trimester 08/13/2019  . Migraines 08/11/2014  . History of cesarean delivery, currently pregnant 01/17/2007    Assessment / Plan: 34 y.o. G3P1011 at [redacted]w[redacted]d here for scheduled IOL/TOLAC.  Labor: s/p FB x2 and pit since 1110, AROM this exam at 1830 for clear. May have some small forebag left but difficult exam with significant patient pain. Cont pitocin.  Fetal Wellbeing: Category I strip Pain Control:  IV fentanyl 100 mcg every hour, epidural upon request GBS: Negative  Anticipated MOD:  Vaginal delivery  TOLAC: Op note reviewed, CS for arrest of descent at +2 with OT presentation in setting of chorio, LTCS performed, OK to TOLAC. GDMA1: CBG checks q4 hours during latent labor, q2 hour in active labor. Last CBG  91>159 after ingestion of soda, given 2u aspart>73.    Clarnce Flock MD/MPH OB Fellow 03/15/2020, 7:44 PM

## 2020-03-15 NOTE — Progress Notes (Signed)
LABOR PROGRESS NOTE  Judy James is a 34 y.o. G3P1011 at [redacted]w[redacted]d  admitted for TOLAC with IOL.  Subjective: Patient is reports more discomfort with contractions on pitocin, growing frustrated with laboring process. Thinking about stopping TOLAC.   Objective: BP 121/61   Pulse 74   Temp 98.3 F (36.8 C) (Oral)   Resp 20   Ht 5\' 1"  (1.549 m)   Wt 89.2 kg   LMP 06/16/2019 (Exact Date)   BMI 37.17 kg/m  or  Vitals:   03/15/20 1338 03/15/20 1407 03/15/20 1445 03/15/20 1630  BP: (!) 95/40 (!) 105/51 111/72 121/61  Pulse: 76 76 85 74  Resp: 20 20    Temp:   98.3 F (36.8 C)   TempSrc:   Oral   Weight:      Height:       Dilation: 2 Effacement (%): 70 Station: Ballotable Presentation: Vertex Exam by:: Dr. Dione Plover FHT: baseline rate 138, moderate varibility, positive acel, absent decel Toco: contractions every 3-4 minutes   Labs: Lab Results  Component Value Date   WBC 9.2 03/15/2020   HGB 12.4 03/15/2020   HCT 37.2 03/15/2020   MCV 87.9 03/15/2020   PLT 169 03/15/2020    Patient Active Problem List   Diagnosis Date Noted  . Gestational diabetes mellitus (GDM) affecting pregnancy, antepartum 12/29/2019  . Obesity affecting pregnancy 10/31/2019  . Supervision of other normal pregnancy, antepartum 09/05/2019  . Ovarian dermoid cyst complicating pregnancy, antepartum, unspecified trimester 08/13/2019  . Migraines 08/11/2014  . History of cesarean delivery, currently pregnant 01/17/2007    Assessment / Plan: 34 y.o. G3P1011 at [redacted]w[redacted]d here for scheduled IOL/TOLAC.  Labor: on pitocin up to18u, still only 2 cm. Patient very frustrated during initial conversation and brought up possibly ending her TOLAC. Reassured patient that she has the right to decline TOLAC and go for rLTCS at anytime she desires. Discussed recommendation if she wishes to continue to place another foley balloon and consider early epidural if pain is factoring into her decision. Then gave her ~15 min to  discuss her options with her partner, and I notified Dr. Roselie Awkward that patient may declined TOLAC.  On return to room patient expressed desire to continue TOLAC and place FB. FB placed, will cont pitocin augmentation, consider AROM at next exam.     Fetal Wellbeing: Category I strip Pain Control:  IV fentanyl 100 mcg every hour, epidural upon request GBS: Negative  Anticipated MOD:  Vaginal delivery  TOLAC: Op note reviewed, CS for arrest of descent at +2 with OT presentation in setting of chorio, LTCS performed, OK to TOLAC. GDMA1: CBG checks q4 hours during latent labor, q2 hour in active labor. Last CBG 91>159 after ingestion of soda, given 2u aspart and will recheck, if continues to be >140 then will need to start endotool.    Clarnce Flock MD/MPH OB Fellow 03/15/2020, 4:38 PM

## 2020-03-15 NOTE — Progress Notes (Signed)
LABOR PROGRESS NOTE  Judy James is a 34 y.o. G3P1011 at [redacted]w[redacted]d  admitted for TOLAC with IOL.  Subjective: Now comfortable with epidural.   Objective: BP 110/68   Pulse 75   Temp (!) 97.5 F (36.4 C) (Oral)   Resp 15   Ht 5\' 1"  (1.549 m)   Wt 89.2 kg   LMP 06/16/2019 (Exact Date)   SpO2 98%   BMI 37.17 kg/m  or  Vitals:   03/15/20 2230 03/15/20 2236 03/15/20 2241 03/15/20 2245  BP: 114/68 112/64 104/60 110/68  Pulse: 69 74 71 75  Resp:  15    Temp:      TempSrc:      SpO2: 98%     Weight:      Height:       Dilation: 5.5 Effacement (%): 70 Cervical Position: Posterior Station: -2 Presentation: Vertex Exam by:: Cerra Eisenhower FHT: baseline rate 120, moderate varibility, positive acel, absent decel Toco: difficult to trace  Labs: Lab Results  Component Value Date   WBC 9.2 03/15/2020   HGB 12.4 03/15/2020   HCT 37.2 03/15/2020   MCV 87.9 03/15/2020   PLT 169 03/15/2020    Patient Active Problem List   Diagnosis Date Noted  . Gestational diabetes mellitus (GDM) affecting pregnancy, antepartum 12/29/2019  . Obesity affecting pregnancy 10/31/2019  . Supervision of other normal pregnancy, antepartum 09/05/2019  . Ovarian dermoid cyst complicating pregnancy, antepartum, unspecified trimester 08/13/2019  . Migraines 08/11/2014  . History of cesarean delivery, currently pregnant 01/17/2007    Assessment / Plan: 34 y.o. G3P1011 at [redacted]w[redacted]d here for scheduled IOL/TOLAC.  Labor: s/p FB x2 and pit since 1110. Bulging bag and AROM performed with large amount of clear fluid. IUPC placed posteriorly. Titrate Pit. Anticipate VBAC. Fetal Wellbeing: Cat I  Pain Control:  Epidural  GBS: Negative  Anticipated MOD:  Vaginal delivery  GDMA1: last glucose 104   Chauncey Mann MD OB Fellow 03/15/2020, 11:05 PM

## 2020-03-15 NOTE — Progress Notes (Addendum)
LABOR PROGRESS NOTE  Judy James is a 34 y.o. G3P1011 at [redacted]w[redacted]d  admitted for TOLAC with IOL.  Subjective: Patient is reports more discomfort with contractions on pitocin, growing frustrated with laboring process.   Objective: BP 111/72   Pulse 85   Temp 98.3 F (36.8 C) (Oral)   Resp 20   Ht 5\' 1"  (1.549 m)   Wt 89.2 kg   LMP 06/16/2019 (Exact Date)   BMI 37.17 kg/m  or  Vitals:   03/15/20 1202 03/15/20 1338 03/15/20 1407 03/15/20 1445  BP: 113/61 (!) 95/40 (!) 105/51 111/72  Pulse: 77 76 76 85  Resp:  20 20   Temp: 98.5 F (36.9 C)   98.3 F (36.8 C)  TempSrc: Oral   Oral  Weight:      Height:       Dilation: 2 Effacement (%): 70 Station: Ballotable Presentation: Vertex Exam by:: Dr. Dione Plover FHT: baseline rate 138, moderate varibility, positive acel, absent decel Toco: contractions every 3-4 minutes   Labs: Lab Results  Component Value Date   WBC 9.2 03/15/2020   HGB 12.4 03/15/2020   HCT 37.2 03/15/2020   MCV 87.9 03/15/2020   PLT 169 03/15/2020    Patient Active Problem List   Diagnosis Date Noted  . Gestational diabetes mellitus (GDM) affecting pregnancy, antepartum 12/29/2019  . Obesity affecting pregnancy 10/31/2019  . Supervision of other normal pregnancy, antepartum 09/05/2019  . Ovarian dermoid cyst complicating pregnancy, antepartum, unspecified trimester 08/13/2019  . Migraines 08/11/2014  . History of cesarean delivery, currently pregnant 01/17/2007    Assessment / Plan: 34 y.o. G3P1011 at [redacted]w[redacted]d here for scheduled IOL/TOLAC.  Labor: on pitocin up to 26/min, 1cm change since prior exam, will place cooks catheter     Fetal Wellbeing: Category I strip Pain Control:  IV fentanyl 100 mcg every hour   GBS: Negative  Anticipated MOD:  Vaginal delivery    Stark Klein, MD  Family Medicine PGY-1  03/15/2020, 4:07 PM

## 2020-03-15 NOTE — H&P (Addendum)
OBSTETRIC ADMISSION HISTORY AND PHYSICAL  Judy James is a 34 y.o. female G4P1011 with IUP at 63w0dby LMP presenting for IOL 2/2 to GDM. She reports +FMs, No LOF, no VB, no blurry vision, headaches or peripheral edema, and RUQ pain.  She plans on breast feeding. She requests no method for birth control. [facility] below [facility] fasting.  Dating: By LMP --->  Estimated Date of Delivery: 03/22/20  Sono:    _0 , CWD, normal anatomy, cephalic presentation, anterior placenta, 3164g, 84% EFW  Prenatal History/Complications: -GDMA1 -Migraines  -Ovarian Dermoid cyst -Obesity   Past Medical History: Past Medical History:  Diagnosis Date  . Arthritis    knee  . Knee pain   . Migraines     Past Surgical History: Past Surgical History:  Procedure Laterality Date  . CESAREAN SECTION  2008  . DILATION AND EVACUATION N/A 11/26/2018   Procedure: DILATATION AND EVACUATION;  Surgeon: PAletha Halim MD;  [facility];  Service: Gynecology;  Laterality: N/A;  . WISDOM TOOTH EXTRACTION      Obstetrical History: OB History    Gravida  3   Para  1   Term  1   Preterm      AB  1   Living  1     SAB  1   TAB      Ectopic      Multiple      Live Births  1           Social History: Social History   Socioeconomic History  . Marital status: Single    Spouse name: Not on file  . Number of children: 1  . Years of education: Not on file  . Highest education level: Some college, no degree  Occupational History  . Not on file  Tobacco Use  . Smoking status: Never Smoker  . Smokeless tobacco: Never Used  Substance and Sexual Activity  . Alcohol use: Not Currently    Alcohol/week: 0.0 standard drinks    Comment: 0-1  . Drug use: No  . Sexual activity: Yes  Other Topics Concern  . Not on file  Social History Narrative   Lives with cild   Caffeine- rare   Social Determinants of Health    Financial Resource Strain:   . Difficulty of Paying Living Expenses:   Food Insecurity:   . Worried About RCharity fundraiserin the Last Year:   . RArboriculturistin the Last Year:   Transportation Needs:   . LFilm/video editor(Medical):   .Marland KitchenLack of Transportation (Non-Medical):   Physical Activity:   . Days of Exercise per Week:   . Minutes of Exercise per Session:   Stress:   . Feeling of Stress :   Social Connections:   . Frequency of Communication with Friends and Family:   . Frequency of Social Gatherings with Friends and Family:   . Attends Religious Services:   . Active Member of Clubs or Organizations:   . Attends CArchivistMeetings:   .Marland KitchenMarital Status:     Family History: Family History  Problem Relation Age of Onset  . Diabetes Mother   . Diabetes Maternal Aunt   . Hearing loss Maternal Aunt   . Heart disease Maternal Aunt   . Hypertension Maternal Aunt   . Stroke Maternal Aunt   . Alcohol abuse Maternal Uncle   .  Diabetes Maternal Uncle   . Drug abuse Maternal Uncle   . Hyperlipidemia Maternal Uncle   . Hypertension Maternal Uncle   . Heart disease Maternal Uncle   . Cancer Maternal Grandmother   . Diabetes Maternal Grandmother   . COPD Maternal Grandfather     Allergies: Allergies  Allergen Reactions  . Penicillin G Hives    Medications Prior to Admission  Medication Sig Dispense Refill Last Dose  . aspirin EC 81 MG tablet Take 81 mg by mouth daily.   03/14/2020 at Unknown time  . Prenatal Vit-Fe Fumarate-FA (MULTIVITAMIN-PRENATAL) 27-0.8 MG TABS tablet Take 1 tablet by mouth daily at 12 noon.   03/14/2020 at Unknown time  . Accu-Chek Softclix Lancets lancets 1 each by Other route in the morning, at noon, in the evening, and at bedtime. Use as instructed 100 each 12   . Blood Glucose Monitoring Suppl (ACCU-CHEK GUIDE) w/Device KIT 1 Device by Does not apply route in the morning, at noon, in the evening, and at bedtime. 1 kit 0   .  Blood Pressure Monitoring (BLOOD PRESSURE KIT) DEVI 1 kit by Does not apply route once a week. Check BP regularly and record readings into the Bab[employer]s App. Large Cuff. DX O90.0 1 each 0   . docusate sodium (COLACE) 100 MG capsule Take 1 capsule (100 mg total) by mouth 2 (two) times daily as needed. 30 capsule 2   . Elastic Bandages & Supports (COMFORT FIT MATERNITY SUPP LG) MISC 1 Units by Does not apply route daily as needed. 1 each 0   . ferrous sulfate 325 (65 FE) MG tablet Take 1 tablet (325 mg total) by mouth 2 (two) times daily with a meal. 60 tablet 0   . glucose blood test strip 1 each by Other route in the morning, at noon, in the evening, and at bedtime. Use as instructed 100 each 12   . ondansetron (ZOFRAN ODT) 4 MG disintegrating tablet Take 1 tablet (4 mg total) by mouth every 8 (eight) hours as needed for nausea or vomiting. 15 tablet 0   . simethicone (GAS-X) 80 MG chewable tablet Chew 1 tablet (80 mg total) by mouth every 6 (six) hours as needed for flatulence. 30 tablet 0   . terconazole (TERAZOL 3) 0.8 % vaginal cream Place 1 applicator vaginally at bedtime. Apply nightly for three nights. 20 g 0      Review of Systems   All systems reviewed and negative except as stated in HPI  Blood pressure (!) 108/53, pulse 77, last menstrual period 06/16/2019. General appearance: alert, cooperative, appears stated age and no distress Lungs: normal effort Heart: regular rate  Abdomen: soft, non-tender; bowel sounds normal Pelvic: gravid uterus, leopolds 3500 g Extremities: Homans sign is negative, no sign of DVT Presentation: cephalic Fetal monitoringBaseline: 130 bpm, Variability: Good {> 6 bpm), Accelerations: Reactive and Decelerations: Absent  Uterine activity: irregular  Dilation: 3 Effacement (%): 50 Station: -3 Exam by:: Jeanann Lewandowsky RN  Prenatal labs: ABO, Rh: --/--/O POS, O POS Performed at [facility], [address] [place], [place] [address]  [phone]  0818) Antibody: NEG (05/24 0818) Rubella: 2.88 (11/13 0946) RPR: NON REACTIVE (05/24 0818)  HBsAg: Negative (11/13 0946)  HIV: Non Reactive (03/05 1019)  GBS: Negative/-- (05/07 1151)  2 hr Glucola: abnormal (fasting 83/1hr 156/2hr 156) Genetic screening:  Declined NIPS, negative AFP  Anatomy US: normal   Prenatal Transfer Tool  Maternal Diabetes: Yes:  Diabetes Type:  Diet  controlled Genetic Screening: Declined Maternal Ultrasounds/Referrals: Normal Fetal Ultrasounds or other Referrals:  None Maternal Substance Abuse:  No Significant Maternal Medications:  Meds include: Other: ASA 90m Significant Maternal Lab Results: Group B Strep negative  Results for orders placed or performed during the hospital encounter of 03/15/20 (from the past 24 hour(s))  CBC   Collection Time: 03/15/20  8:18 AM  Result Value Ref Range   WBC 9.2 4.0 - 10.5 K/uL   RBC 4.23 3.87 - 5.11 MIL/uL   Hemoglobin 12.4 12.0 - 15.0 g/dL   HCT 37.2 36.0 - 46.0 %   MCV 87.9 80.0 - 100.0 fL   MCH 29.3 26.0 - 34.0 pg   MCHC 33.3 30.0 - 36.0 g/dL   RDW 13.7 11.5 - 15.5 %   Platelets 169 150 - 400 K/uL   nRBC 0.0 0.0 - 0.2 %  RPR   Collection Time: 03/15/20  8:18 AM  Result Value Ref Range   RPR Ser Ql NON REACTIVE NON REACTIVE  Type and screen   Collection Time: 03/15/20  8:18 AM  Result Value Ref Range   ABO/RH(D) O POS    Antibody Screen NEG    Sample Expiration      03/18/2020,2359 Performed at [facility] [address], [place] [address]  ABO/Rh   Collection Time: 03/15/20  8:18 AM  Result Value Ref Range   ABO/RH(D)      O POS [place]formed at [place] [place] [id number]  Glucose, capillary   Collection Time: 03/15/20 10:23 AM  Result Value Ref Range   Glucose-Capillary 91 70 - 99 mg/dL    Patient Active Problem List   Diagnosis Date Noted  . Gestational diabetes mellitus (GDM) affecting pregnancy, antepartum 12/29/2019  . Obesity  affecting pregnancy 10/31/2019  . Supervision of other normal pregnancy, antepartum 09/05/2019  . Ovarian dermoid cyst complicating pregnancy, antepartum, unspecified trimester 08/13/2019  . Migraines 08/11/2014  . History of cesarean delivery, currently pregnant 01/17/2007   Assessment/Plan:  Judy BORREROis a 34y.o. G3P1011 at 374w0dere for IOL.   #Labor:start pitocin 2x2, foley bulb inserted 03/14/20 @ 1800 and out on presentation to floor, TOLAC(consented 12/26/19) for VBAC, anticipate NSVD. Op note reviewed, CS for arrest of descent at +2 with OT presentation in setting of chorio, LTCS performed, OK to TOLAC. #Pain: IV pain meds first (fentanyl 50 mg q1hr while in active labor), epidural last resort  #FWB: Category I; leopolds 3500g #ID:  GBS negative  #MOF: breast feeding  #MOC: NFP #Circ:  Yes  #Bilateral Dermoid Cysts: stable per last ultrasound  #GDMA1: CBG checks q4 hours during latent labor, q2 hour in active labor   Vaccines: Declined influenza, declined TDWayne LakesMDNora Springsor [facility] 03/15/2020, 11:42 AM    OB FELLOW ATTESTATION  I have seen and examined this patient and edited the above documentation in the resident's note to reflect any changes or updates.  MaAugustin CoupeMD/MPH OB Fellow  03/15/2020, 1:56 PM

## 2020-03-16 ENCOUNTER — Encounter (HOSPITAL_COMMUNITY): Payer: Self-pay | Admitting: Obstetrics & Gynecology

## 2020-03-16 ENCOUNTER — Encounter (HOSPITAL_COMMUNITY)
Admission: AD | Disposition: A | Discharge status: Home / Self Care | Payer: Self-pay | Source: Home / Self Care | Attending: Family Medicine

## 2020-03-16 DIAGNOSIS — O24420 Gestational diabetes mellitus in childbirth, diet controlled: Principal | ICD-10-CM

## 2020-03-16 DIAGNOSIS — Z3A39 39 weeks gestation of pregnancy: Secondary | ICD-10-CM | POA: Diagnosis not present

## 2020-03-16 DIAGNOSIS — O411230 Chorioamnionitis, third trimester, not applicable or unspecified: Secondary | ICD-10-CM | POA: Diagnosis not present

## 2020-03-16 DIAGNOSIS — O411290 Chorioamnionitis, unspecified trimester, not applicable or unspecified: Secondary | ICD-10-CM

## 2020-03-16 DIAGNOSIS — O34211 Maternal care for low transverse scar from previous cesarean delivery: Secondary | ICD-10-CM

## 2020-03-16 LAB — COMPREHENSIVE METABOLIC PANEL
ALT: 13 U/L (ref 0–44)
AST: 26 U/L (ref 15–41)
Albumin: 2.4 g/dL — ABNORMAL LOW (ref 3.5–5.0)
Alkaline Phosphatase: 113 U/L (ref 38–126)
Anion gap: 9 (ref 5–15)
BUN: <5 mg/dL — ABNORMAL LOW (ref 6–20)
CO2: 18 mmol/L — ABNORMAL LOW (ref 22–32)
Calcium: 8.4 mg/dL — ABNORMAL LOW (ref 8.9–10.3)
Chloride: 105 mmol/L (ref 98–111)
Creatinine, Ser: 1.99 mg/dL — ABNORMAL HIGH (ref 0.44–1.00)
GFR calc Af Amer: 38 mL/min — ABNORMAL LOW (ref >60)
GFR calc non Af Amer: 33 mL/min — ABNORMAL LOW (ref >60)
Glucose, Bld: 94 mg/dL (ref 70–99)
Potassium: 3.8 mmol/L (ref 3.5–5.1)
Sodium: 132 mmol/L — ABNORMAL LOW (ref 135–145)
Total Bilirubin: 1.1 mg/dL (ref 0.3–1.2)
Total Protein: 5.1 g/dL — ABNORMAL LOW (ref 6.5–8.1)

## 2020-03-16 LAB — CBC
HCT: 38.5 % (ref 36.0–46.0)
Hemoglobin: 12.9 g/dL (ref 12.0–15.0)
MCH: 29.5 pg (ref 26.0–34.0)
MCHC: 33.5 g/dL (ref 30.0–36.0)
MCV: 88.1 fL (ref 80.0–100.0)
Platelets: 170 10*3/uL (ref 150–400)
RBC: 4.37 MIL/uL (ref 3.87–5.11)
RDW: 14 % (ref 11.5–15.5)
WBC: 21.2 10*3/uL — ABNORMAL HIGH (ref 4.0–10.5)
nRBC: 0 % (ref 0.0–0.2)

## 2020-03-16 LAB — GLUCOSE, CAPILLARY
Glucose-Capillary: 129 mg/dL — ABNORMAL HIGH (ref 70–99)
Glucose-Capillary: 142 mg/dL — ABNORMAL HIGH (ref 70–99)
Glucose-Capillary: 224 mg/dL — ABNORMAL HIGH (ref 70–99)
Glucose-Capillary: 46 mg/dL — ABNORMAL LOW (ref 70–99)
Glucose-Capillary: 55 mg/dL — ABNORMAL LOW (ref 70–99)
Glucose-Capillary: 60 mg/dL — ABNORMAL LOW (ref 70–99)
Glucose-Capillary: 73 mg/dL (ref 70–99)
Glucose-Capillary: 74 mg/dL (ref 70–99)
Glucose-Capillary: 81 mg/dL (ref 70–99)
Glucose-Capillary: 90 mg/dL (ref 70–99)
Glucose-Capillary: 93 mg/dL (ref 70–99)
Glucose-Capillary: 94 mg/dL (ref 70–99)
Glucose-Capillary: 97 mg/dL (ref 70–99)

## 2020-03-16 SURGERY — Surgical Case
Anesthesia: Epidural | Wound class: Clean Contaminated

## 2020-03-16 MED ORDER — ONDANSETRON HCL 4 MG/2ML IJ SOLN
INTRAMUSCULAR | Status: DC | PRN
  Administered 2020-03-16: 4 mg via INTRAVENOUS

## 2020-03-16 MED ORDER — SODIUM CHLORIDE (PF) 0.9 % IJ SOLN
INTRAMUSCULAR | Status: DC | PRN
  Administered 2020-03-15: 12 mL/h via EPIDURAL

## 2020-03-16 MED ORDER — DIPHENHYDRAMINE HCL 25 MG PO CAPS: 25.0000 mg | ORAL_CAPSULE | Freq: Four times a day (QID) | ORAL | Status: DC | PRN

## 2020-03-16 MED ORDER — MORPHINE SULFATE (PF) 0.5 MG/ML IJ SOLN
INTRAMUSCULAR | Status: AC
  Filled 2020-03-16: qty 10

## 2020-03-16 MED ORDER — INSULIN ASPART 100 UNIT/ML ~~LOC~~ SOLN
4.0000 [IU] | Freq: Once | SUBCUTANEOUS | Status: AC
  Administered 2020-03-16: 4 [IU] via SUBCUTANEOUS

## 2020-03-16 MED ORDER — DIPHENHYDRAMINE HCL 50 MG/ML IJ SOLN
25.0000 mg | Freq: Once | INTRAMUSCULAR | Status: AC
  Administered 2020-03-16: 25 mg via INTRAVENOUS
  Filled 2020-03-16: qty 1

## 2020-03-16 MED ORDER — MENTHOL 3 MG MT LOZG: 1.0000 | LOZENGE | OROMUCOSAL | Status: DC | PRN

## 2020-03-16 MED ORDER — LIDOCAINE HCL (PF) 1 % IJ SOLN
INTRAMUSCULAR | Status: DC | PRN
  Administered 2020-03-15: 6 mL via EPIDURAL

## 2020-03-16 MED ORDER — DIBUCAINE (PERIANAL) 1 % EX OINT: 1.0000 "application " | TOPICAL_OINTMENT | CUTANEOUS | Status: DC | PRN

## 2020-03-16 MED ORDER — ALBUMIN HUMAN 5 % IV SOLN: INTRAVENOUS | Status: DC | PRN

## 2020-03-16 MED ORDER — ONDANSETRON HCL 4 MG/2ML IJ SOLN: 4.0000 mg | Freq: Once | INTRAMUSCULAR | Status: DC | PRN

## 2020-03-16 MED ORDER — METOCLOPRAMIDE HCL 5 MG/ML IJ SOLN
INTRAMUSCULAR | Status: AC
  Filled 2020-03-16: qty 2

## 2020-03-16 MED ORDER — IBUPROFEN 800 MG PO TABS
800.0000 mg | ORAL_TABLET | Freq: Three times a day (TID) | ORAL | Status: DC
Start: 2020-03-18 — End: 2020-03-16

## 2020-03-16 MED ORDER — HYDROCODONE-ACETAMINOPHEN 5-325 MG PO TABS
1.0000 | ORAL_TABLET | ORAL | Status: DC | PRN
  Administered 2020-03-17 (×2): 1 via ORAL
  Administered 2020-03-18: 2 via ORAL
  Administered 2020-03-18 (×2): 1 via ORAL
  Administered 2020-03-18: 2 via ORAL
  Filled 2020-03-16 (×2): qty 1
  Filled 2020-03-16: qty 2
  Filled 2020-03-16 (×2): qty 1
  Filled 2020-03-16: qty 2

## 2020-03-16 MED ORDER — ACETAMINOPHEN 160 MG/5ML PO SOLN: 325.0000 mg | ORAL | Status: DC | PRN

## 2020-03-16 MED ORDER — CLINDAMYCIN PHOSPHATE 900 MG/50ML IV SOLN
900.0000 mg | Freq: Once | INTRAVENOUS | Status: AC
  Administered 2020-03-16: 900 mg via INTRAVENOUS

## 2020-03-16 MED ORDER — FENTANYL CITRATE (PF) 250 MCG/5ML IJ SOLN
INTRAMUSCULAR | Status: DC | PRN
  Administered 2020-03-16: 100 ug via EPIDURAL

## 2020-03-16 MED ORDER — MORPHINE SULFATE (PF) 0.5 MG/ML IJ SOLN
INTRAMUSCULAR | Status: DC | PRN
  Administered 2020-03-16: 3 mg via EPIDURAL

## 2020-03-16 MED ORDER — VANCOMYCIN HCL IN DEXTROSE 1-5 GM/200ML-% IV SOLN
1000.0000 mg | Freq: Two times a day (BID) | INTRAVENOUS | Status: DC
  Administered 2020-03-16: 1000 mg via INTRAVENOUS
  Filled 2020-03-16: qty 200

## 2020-03-16 MED ORDER — OXYCODONE HCL 5 MG/5ML PO SOLN: 5.0000 mg | Freq: Once | ORAL | Status: DC | PRN

## 2020-03-16 MED ORDER — MEPERIDINE HCL 25 MG/ML IJ SOLN
INTRAMUSCULAR | Status: DC | PRN
  Administered 2020-03-16: 12.5 mg via INTRAVENOUS

## 2020-03-16 MED ORDER — SIMETHICONE 80 MG PO CHEW
80.0000 mg | CHEWABLE_TABLET | Freq: Three times a day (TID) | ORAL | Status: DC
  Administered 2020-03-17 – 2020-03-18 (×5): 80 mg via ORAL
  Filled 2020-03-16 (×5): qty 1

## 2020-03-16 MED ORDER — VANCOMYCIN HCL IN DEXTROSE 1-5 GM/200ML-% IV SOLN: 1000.0000 mg | Freq: Two times a day (BID) | INTRAVENOUS | Status: DC

## 2020-03-16 MED ORDER — FENTANYL CITRATE (PF) 100 MCG/2ML IJ SOLN
INTRAMUSCULAR | Status: AC
  Filled 2020-03-16: qty 2

## 2020-03-16 MED ORDER — FENTANYL-BUPIVACAINE-NACL 0.5-0.125-0.9 MG/250ML-% EP SOLN
12.0000 mL/h | EPIDURAL | Status: DC | PRN
  Administered 2020-03-16: 12 mL/h via EPIDURAL
  Filled 2020-03-16: qty 250

## 2020-03-16 MED ORDER — SODIUM CHLORIDE 0.9 % IR SOLN
Status: DC | PRN
  Administered 2020-03-16: 1

## 2020-03-16 MED ORDER — DIPHENHYDRAMINE HCL 50 MG/ML IJ SOLN: 12.5000 mg | INTRAMUSCULAR | Status: DC | PRN

## 2020-03-16 MED ORDER — METOCLOPRAMIDE HCL 5 MG/ML IJ SOLN
INTRAMUSCULAR | Status: DC | PRN
  Administered 2020-03-16: 10 mg via INTRAVENOUS

## 2020-03-16 MED ORDER — ONDANSETRON HCL 4 MG/2ML IJ SOLN
INTRAMUSCULAR | Status: AC
  Filled 2020-03-16: qty 2

## 2020-03-16 MED ORDER — SIMETHICONE 80 MG PO CHEW: 80.0000 mg | CHEWABLE_TABLET | ORAL | Status: DC | PRN

## 2020-03-16 MED ORDER — WITCH HAZEL-GLYCERIN EX PADS: 1.0000 "application " | MEDICATED_PAD | CUTANEOUS | Status: DC | PRN

## 2020-03-16 MED ORDER — DEXAMETHASONE SODIUM PHOSPHATE 10 MG/ML IJ SOLN
INTRAMUSCULAR | Status: DC | PRN
Start: 2020-03-16 — End: 2020-03-16
  Administered 2020-03-16: 10 mg via INTRAVENOUS

## 2020-03-16 MED ORDER — PHENYLEPHRINE HCL (PRESSORS) 10 MG/ML IV SOLN
INTRAVENOUS | Status: DC | PRN
  Administered 2020-03-16 (×3): 80 ug via INTRAVENOUS

## 2020-03-16 MED ORDER — GENTAMICIN SULFATE 40 MG/ML IJ SOLN
5.0000 mg/kg | INTRAVENOUS | Status: DC
  Administered 2020-03-16: 320 mg via INTRAVENOUS
  Filled 2020-03-16 (×2): qty 8

## 2020-03-16 MED ORDER — LIDOCAINE-EPINEPHRINE 2 %-1:100000 IJ SOLN
INTRAMUSCULAR | Status: DC | PRN
Start: 2020-03-16 — End: 2020-03-16
  Administered 2020-03-16: 10 mL via INTRADERMAL

## 2020-03-16 MED ORDER — FENTANYL CITRATE (PF) 100 MCG/2ML IJ SOLN
25.0000 ug | INTRAMUSCULAR | Status: DC | PRN
  Administered 2020-03-16 (×2): 50 ug via INTRAVENOUS

## 2020-03-16 MED ORDER — SIMETHICONE 80 MG PO CHEW
80.0000 mg | CHEWABLE_TABLET | ORAL | Status: DC
  Administered 2020-03-17 (×2): 80 mg via ORAL
  Filled 2020-03-16 (×2): qty 1

## 2020-03-16 MED ORDER — CLINDAMYCIN PHOSPHATE 900 MG/50ML IV SOLN
INTRAVENOUS | Status: AC
  Filled 2020-03-16: qty 50

## 2020-03-16 MED ORDER — TRANEXAMIC ACID-NACL 1000-0.7 MG/100ML-% IV SOLN
INTRAVENOUS | Status: AC
  Filled 2020-03-16: qty 100

## 2020-03-16 MED ORDER — OXYCODONE HCL 5 MG PO TABS: 5.0000 mg | ORAL_TABLET | Freq: Once | ORAL | Status: DC | PRN

## 2020-03-16 MED ORDER — LACTATED RINGERS IV SOLN: INTRAVENOUS | Status: DC | PRN

## 2020-03-16 MED ORDER — KETOROLAC TROMETHAMINE 30 MG/ML IJ SOLN
30.0000 mg | Freq: Four times a day (QID) | INTRAMUSCULAR | Status: DC
Start: 2020-03-17 — End: 2020-03-16

## 2020-03-16 MED ORDER — ACETAMINOPHEN 325 MG PO TABS: 325.0000 mg | ORAL_TABLET | ORAL | Status: DC | PRN

## 2020-03-16 MED ORDER — DEXTROSE 50 % IV SOLN
INTRAVENOUS | Status: AC
  Administered 2020-03-16 (×2): 12.5 g
  Filled 2020-03-16: qty 50

## 2020-03-16 MED ORDER — SENNOSIDES-DOCUSATE SODIUM 8.6-50 MG PO TABS
2.0000 | ORAL_TABLET | ORAL | Status: DC
  Administered 2020-03-17: 2 via ORAL
  Filled 2020-03-16 (×2): qty 2

## 2020-03-16 MED ORDER — LACTATED RINGERS IV SOLN: INTRAVENOUS | Status: DC

## 2020-03-16 MED ORDER — PHENYLEPHRINE 40 MCG/ML (10ML) SYRINGE FOR IV PUSH (FOR BLOOD PRESSURE SUPPORT)
PREFILLED_SYRINGE | INTRAVENOUS | Status: AC
  Filled 2020-03-16: qty 10

## 2020-03-16 MED ORDER — ACETAMINOPHEN 500 MG PO TABS
1000.0000 mg | ORAL_TABLET | Freq: Four times a day (QID) | ORAL | Status: DC | PRN
  Administered 2020-03-16: 1000 mg via ORAL
  Filled 2020-03-16: qty 2

## 2020-03-16 MED ORDER — MEPERIDINE HCL 25 MG/ML IJ SOLN: 6.2500 mg | INTRAMUSCULAR | Status: DC | PRN

## 2020-03-16 MED ORDER — ENOXAPARIN SODIUM 40 MG/0.4ML ~~LOC~~ SOLN
40.0000 mg | SUBCUTANEOUS | Status: DC
  Administered 2020-03-17 – 2020-03-18 (×2): 40 mg via SUBCUTANEOUS
  Filled 2020-03-16 (×2): qty 0.4

## 2020-03-16 MED ORDER — GENTAMICIN SULFATE 40 MG/ML IJ SOLN: 5.0000 mg/kg | INTRAVENOUS | Status: DC

## 2020-03-16 MED ORDER — METHYLERGONOVINE MALEATE 0.2 MG/ML IJ SOLN
INTRAMUSCULAR | Status: DC | PRN
  Administered 2020-03-16: .2 mg via INTRAMUSCULAR

## 2020-03-16 MED ORDER — COCONUT OIL OIL
1.0000 "application " | TOPICAL_OIL | Status: DC | PRN
  Administered 2020-03-17: 1 via TOPICAL

## 2020-03-16 MED ORDER — TRANEXAMIC ACID 1000 MG/10ML IV SOLN
INTRAVENOUS | Status: DC | PRN
  Administered 2020-03-16: 1000 mg via INTRAVENOUS

## 2020-03-16 MED ORDER — OXYTOCIN 40 UNITS IN NORMAL SALINE INFUSION - SIMPLE MED: 2.5000 [IU]/h | INTRAVENOUS | Status: AC

## 2020-03-16 MED ORDER — MISOPROSTOL 200 MCG PO TABS
400.0000 ug | ORAL_TABLET | Freq: Once | ORAL | Status: AC
  Administered 2020-03-16: 400 ug via BUCCAL

## 2020-03-16 MED ORDER — DEXAMETHASONE SODIUM PHOSPHATE 10 MG/ML IJ SOLN
INTRAMUSCULAR | Status: AC
  Filled 2020-03-16: qty 1

## 2020-03-16 MED ORDER — PRENATAL MULTIVITAMIN CH
1.0000 | ORAL_TABLET | Freq: Every day | ORAL | Status: DC
  Administered 2020-03-17 – 2020-03-18 (×2): 1 via ORAL
  Filled 2020-03-16 (×2): qty 1

## 2020-03-16 MED ORDER — MEPERIDINE HCL 25 MG/ML IJ SOLN
INTRAMUSCULAR | Status: AC
  Filled 2020-03-16: qty 1

## 2020-03-16 MED ORDER — OXYTOCIN 40 UNITS IN NORMAL SALINE INFUSION - SIMPLE MED
INTRAVENOUS | Status: AC
  Filled 2020-03-16: qty 1000

## 2020-03-16 MED ORDER — CLINDAMYCIN PHOSPHATE 900 MG/50ML IV SOLN
900.0000 mg | Freq: Four times a day (QID) | INTRAVENOUS | Status: AC
  Administered 2020-03-17 (×4): 900 mg via INTRAVENOUS
  Filled 2020-03-16 (×4): qty 50

## 2020-03-16 MED ORDER — SODIUM CHLORIDE 0.9 % IV SOLN: INTRAVENOUS | Status: DC | PRN

## 2020-03-16 SURGICAL SUPPLY — 36 items
APL SKNCLS STERI-STRIP NONHPOA (GAUZE/BANDAGES/DRESSINGS) ×1
BENZOIN TINCTURE PRP APPL 2/3 (GAUZE/BANDAGES/DRESSINGS) ×3 IMPLANT
CHLORAPREP W/TINT 26ML (MISCELLANEOUS) ×3 IMPLANT
CLAMP CORD UMBIL (MISCELLANEOUS) IMPLANT
CLOSURE STERI-STRIP 1/4X4 (GAUZE/BANDAGES/DRESSINGS) ×2 IMPLANT
CLOSURE WOUND 1/2 X4 (GAUZE/BANDAGES/DRESSINGS) ×1
CLOTH BEACON ORANGE TIMEOUT ST (SAFETY) ×3 IMPLANT
DRSG OPSITE POSTOP 4X10 (GAUZE/BANDAGES/DRESSINGS) ×3 IMPLANT
ELECT REM PT RETURN 9FT ADLT (ELECTROSURGICAL) ×3
ELECTRODE REM PT RTRN 9FT ADLT (ELECTROSURGICAL) ×1 IMPLANT
EXTRACTOR VACUUM M CUP 4 TUBE (SUCTIONS) IMPLANT
EXTRACTOR VACUUM M CUP 4' TUBE (SUCTIONS)
GLOVE BIOGEL PI IND STRL 7.0 (GLOVE) ×2 IMPLANT
GLOVE BIOGEL PI IND STRL 7.5 (GLOVE) ×2 IMPLANT
GLOVE BIOGEL PI INDICATOR 7.0 (GLOVE) ×4
GLOVE BIOGEL PI INDICATOR 7.5 (GLOVE) ×4
GLOVE ECLIPSE 7.5 STRL STRAW (GLOVE) ×3 IMPLANT
GOWN STRL REUS W/TWL LRG LVL3 (GOWN DISPOSABLE) ×9 IMPLANT
KIT ABG SYR 3ML LUER SLIP (SYRINGE) ×2 IMPLANT
NDL HYPO 25X5/8 SAFETYGLIDE (NEEDLE) IMPLANT
NEEDLE HYPO 25X5/8 SAFETYGLIDE (NEEDLE) ×3 IMPLANT
NS IRRIG 1000ML POUR BTL (IV SOLUTION) ×3 IMPLANT
PACK C SECTION WH (CUSTOM PROCEDURE TRAY) ×3 IMPLANT
PAD OB MATERNITY 4.3X12.25 (PERSONAL CARE ITEMS) ×3 IMPLANT
PENCIL SMOKE EVAC W/HOLSTER (ELECTROSURGICAL) ×3 IMPLANT
RTRCTR C-SECT PINK 25CM LRG (MISCELLANEOUS) ×3 IMPLANT
STRIP CLOSURE SKIN 1/2X4 (GAUZE/BANDAGES/DRESSINGS) ×2 IMPLANT
SUT PLAIN 2 0 XLH (SUTURE) ×2 IMPLANT
SUT VIC AB 0 CTX 36 (SUTURE) ×12
SUT VIC AB 0 CTX36XBRD ANBCTRL (SUTURE) ×3 IMPLANT
SUT VIC AB 2-0 CT1 27 (SUTURE) ×3
SUT VIC AB 2-0 CT1 TAPERPNT 27 (SUTURE) ×1 IMPLANT
SUT VIC AB 4-0 KS 27 (SUTURE) ×3 IMPLANT
TOWEL OR 17X24 6PK STRL BLUE (TOWEL DISPOSABLE) ×3 IMPLANT
TRAY FOLEY W/BAG SLVR 14FR LF (SET/KITS/TRAYS/PACK) ×3 IMPLANT
WATER STERILE IRR 1000ML POUR (IV SOLUTION) ×3 IMPLANT

## 2020-03-16 NOTE — Progress Notes (Signed)
LABOR PROGRESS NOTE  Judy James is a 34 y.o. G3P1011 at [redacted]w[redacted]d  admitted for IOL/TOLAC.  Subjective: Patient reports she is feeling comfortable with epidural. No longer reporting cramping.   Objective: BP (!) 96/51   Pulse 84   Temp 99.3 F (37.4 C) (Oral)   Resp 16   Ht 5\' 1"  (1.549 m)   Wt 89.2 kg   LMP 06/16/2019 (Exact Date)   SpO2 100%   BMI 37.17 kg/m  or  Vitals:   03/16/20 1258 03/16/20 1301 03/16/20 1331 03/16/20 1401  BP: (!) 94/46 (!) 92/51 (!) 98/54 (!) 96/51  Pulse: 91 91 92 84  Resp:   16   Temp:      TempSrc:      SpO2:      Weight:      Height:        Dilation: Lip/rim Effacement (%): 100 Cervical Position: Posterior Station: Plus 3 Presentation: Vertex Exam by:: Dr Dione Plover FHT: baseline rate 150, moderate varibility, positive acel, no decel Toco: contractions every 3-5 minutes   Labs: Lab Results  Component Value Date   WBC 9.2 03/15/2020   HGB 12.4 03/15/2020   HCT 37.2 03/15/2020   MCV 87.9 03/15/2020   PLT 169 03/15/2020    Patient Active Problem List   Diagnosis Date Noted  . Gestational diabetes mellitus (GDM) affecting pregnancy, antepartum 12/29/2019  . Obesity affecting pregnancy 10/31/2019  . Supervision of other normal pregnancy, antepartum 09/05/2019  . Ovarian dermoid cyst complicating pregnancy, antepartum, unspecified trimester 08/13/2019  . Migraines 08/11/2014  . History of cesarean delivery, currently pregnant 01/17/2007    Assessment / Plan: 34 y.o. G3P1011 at [redacted]w[redacted]d here for IOL/TOLAC, progressing well.  Labor: continue with pitocin for ~40 mins Fetal Wellbeing:  Category I strip  Pain Control:  Epidural  GBS: negative  Triple I: Vanc and Gent IV  GDM: CBG monitoring, last 142 (received 1 amp D50 for BG 46 this AM) Anticipated MOD:  Vaginal delivery    Stark Klein, MD Family Medicine, PGY-1  03/16/2020, 2:39 PM

## 2020-03-16 NOTE — Progress Notes (Signed)
ANTIBIOTIC CONSULT NOTE - INITIAL  Pharmacy Consult for Gentamicin Indication: Chorioamnionitis  Allergies  Allergen Reactions  . Penicillin G Hives    Patient Measurements: Height: 5\' 1"  (154.9 cm) Weight: 89.2 kg (196 lb 11.2 oz) IBW/kg (Calculated) : 47.8 Adjusted Body Weight: 64.4 kg  Vital Signs: Temp: 98.9 F (37.2 C) (05/25 0645) Temp Source: Oral (05/25 0645) BP: 90/68 (05/25 1013) Pulse Rate: 115 (05/25 1013)  Labs: Recent Labs    03/15/20 0818  WBC 9.2  HGB 12.4  PLT 169   No results for input(s): GENTTROUGH, GENTPEAK, GENTRANDOM in the last 72 hours.   Microbiology: Recent Results (from the past 720 hour(s))  Strep Gp B Culture+Rflx     Status: None   Collection Time: 02/27/20 11:51 AM   Specimen: Genital   VR  Result Value Ref Range Status   Strep Gp B Culture+Rflx Negative Negative Final    Comment: Centers for Disease Control and Prevention (CDC) and American Congress of Obstetricians and Gynecologists (ACOG) guidelines for prevention of perinatal group B streptococcal (GBS) disease specify co-collection of a vaginal and rectal swab specimen to maximize sensitivity of GBS detection. Per the CDC and ACOG, swabbing both the lower vagina and rectum substantially increases the yield of detection compared with sampling the vagina alone. Penicillin G, ampicillin, or cefazolin are indicated for intrapartum prophylaxis of perinatal GBS colonization. Reflex susceptibility testing should be performed prior to use of clindamycin only on GBS isolates from penicillin-allergic women who are considered a high risk for anaphylaxis. Treatment with vancomycin without additional testing is warranted if resistance to clindamycin is noted.   SARS CORONAVIRUS 2 (TAT 6-24 HRS) Nasopharyngeal Nasopharyngeal Swab     Status: None   Collection Time: 03/13/20  1:36 PM   Specimen: Nasopharyngeal Swab  Result Value Ref Range Status   SARS Coronavirus 2 NEGATIVE NEGATIVE  Final    Comment: (NOTE) SARS-CoV-2 target nucleic acids are NOT DETECTED. The SARS-CoV-2 RNA is generally detectable in upper and lower respiratory specimens during the acute phase of infection. Negative results do not preclude SARS-CoV-2 infection, do not rule out co-infections with other pathogens, and should not be used as the sole basis for treatment or other patient management decisions. Negative results must be combined with clinical observations, patient history, and epidemiological information. The expected result is Negative. Fact Sheet for Patients: SugarRoll.be Fact Sheet for Healthcare Providers: https://www.woods-mathews.com/ This test is not yet approved or cleared by the [place] FDA and  has been authorized for detection and/or diagnosis of SARS-CoV-2 by FDA under an Emergency Use Authorization (EUA). This EUA will remain  in effect (meaning this test can be used) for the duration of the COVID-19 declaration under Section 56 4(b)(1) of the Act, 21 U.S.C. section 360bbb-3(b)(1), unless the authorization is terminated or revoked sooner. Performed at [facility], [address] [place], [place] [address]     Medications:  Vancomycin 1000 mg IV every 12 hours  Assessment: 34 y.o. female G3P1011 at [redacted]w[redacted]d   Goal of Therapy:  Gentamicin peak 20-25 mg/L and Trough < 1 mg/L  Plan:  Gentamicin 320 mg IV every 24 hrs  Check Scr with next labs if gentamicin continued. Will check gentamicin levels if continued > 72hr or clinically indicated.   Thank you for allowing [place] to participate in this patients care. Jens Som, PharmD 03/16/2020,10:39 AM

## 2020-03-16 NOTE — Progress Notes (Signed)
LABOR PROGRESS NOTE  Judy James is a 34 y.o. G3P1011 at [redacted]w[redacted]d admitted for TOLAC with IOL.  Subjective: Comfortable with epidural. Feeling pressure   Objective: BP (!) 87/55   Pulse 68   Temp 97.8 F (36.6 C) (Oral)   Resp 18   Ht 5\' 1"  (1.549 m)   Wt 89.2 kg   LMP 06/16/2019 (Exact Date)   SpO2 100%   BMI 37.17 kg/m  or  Vitals:   03/16/20 0301 03/16/20 0305 03/16/20 0310 03/16/20 0320  BP: (!) 87/55     Pulse: 68     Resp:    18  Temp:      TempSrc:      SpO2:  100% 100% 100%  Weight:      Height:       Dilation: 6.5 Effacement (%): 80 Cervical Position: Posterior Station: -2 Presentation: Vertex Exam by:: C Cornetto RN FHT: baseline rate 125, moderate varibility, positive acel, absent decel Toco: q70m Labs: Lab Results  Component Value Date   WBC 9.2 03/15/2020   HGB 12.4 03/15/2020   HCT 37.2 03/15/2020   MCV 87.9 03/15/2020   PLT 169 03/15/2020    Patient Active Problem List   Diagnosis Date Noted  . Gestational diabetes mellitus (GDM) affecting pregnancy, antepartum 12/29/2019  . Obesity affecting pregnancy 10/31/2019  . Supervision of other normal pregnancy, antepartum 09/05/2019  . Ovarian dermoid cyst complicating pregnancy, antepartum, unspecified trimester 08/13/2019  . Migraines 08/11/2014  . History of cesarean delivery, currently pregnant 01/17/2007    Assessment / Plan: 34 y.o. G3P1011 at [redacted]w[redacted]d here for scheduled IOL/TOLAC.  Labor: s/p FB x2 and pit since 1110. S/p AROM. MVU's adequate. Cervix more swollen per RN, will trial IV Benadryl. Cont position changes. Anticipate VBAC. Fetal Wellbeing: Cat I  Pain Control:  Epidural  GBS: Negative  Anticipated MOD:  Vaginal delivery  GDMA1: last glucose 94   Chauncey Mann MD OB Fellow 03/16/2020, 3:36 AM

## 2020-03-16 NOTE — Progress Notes (Signed)
LABOR PROGRESS NOTE  Judy James is a 34 y.o. G3P1011 at [redacted]w[redacted]d  admitted for IOL/TOLAC.  Subjective: Patient reports more comfort since epidural.   Objective: BP (!) 104/58   Pulse 76   Temp 98.9 F (37.2 C) (Oral)   Resp 16   Ht 5\' 1"  (1.549 m)   Wt 89.2 kg   LMP 06/16/2019 (Exact Date)   SpO2 100%   BMI 37.17 kg/m  or  Vitals:   03/16/20 0700 03/16/20 0701 03/16/20 0755 03/16/20 0801  BP:  117/66  (!) 104/58  Pulse:  70  76  Resp:      Temp:      TempSrc:      SpO2: 100%  100% 100%  Weight:      Height:         Dilation: 7 Effacement (%): 90 Cervical Position: Posterior Station: Plus 1 Presentation: Vertex Exam by:: Dr. Dione Plover FHT: baseline rate 150, moderate varibility, positive acel, positive decel Toco: IUPC placed, contractions every 2-3 minutes, inadequate   Labs: Lab Results  Component Value Date   WBC 9.2 03/15/2020   HGB 12.4 03/15/2020   HCT 37.2 03/15/2020   MCV 87.9 03/15/2020   PLT 169 03/15/2020    Patient Active Problem List   Diagnosis Date Noted  . Gestational diabetes mellitus (GDM) affecting pregnancy, antepartum 12/29/2019  . Obesity affecting pregnancy 10/31/2019  . Supervision of other normal pregnancy, antepartum 09/05/2019  . Ovarian dermoid cyst complicating pregnancy, antepartum, unspecified trimester 08/13/2019  . Migraines 08/11/2014  . History of cesarean delivery, currently pregnant 01/17/2007    Assessment / Plan: 34 y.o. G3P1011 at [redacted]w[redacted]d here for IOL/TOLAC progressing well on pitocin.  Labor: continue with pitocin, current level 20 (2 by 2) Fetal Wellbeing:  Category II, two late decelerations  Pain Control:  Epidural  GBS: Negative  Anticipated MOD:  Vaginal delivery    Stark Klein, MD Family Medicine, PGY-1  03/16/2020, 9:19 AM

## 2020-03-16 NOTE — Progress Notes (Addendum)
LABOR PROGRESS NOTE  Judy James is a 34 y.o. G3P1011 at [redacted]w[redacted]d  admitted for IOL/TOLAC.  Subjective: Patient reports cramping and increasing discomfort despite increasing epidural. No longer with nausea.   Objective: BP (!) 94/52   Pulse 89   Temp 99.2 F (37.3 C) (Oral)   Resp 16   Ht 5\' 1"  (1.549 m)   Wt 89.2 kg   LMP 06/16/2019 (Exact Date)   SpO2 100%   BMI 37.17 kg/m  or  Vitals:   03/16/20 1122 03/16/20 1123 03/16/20 1141 03/16/20 1228  BP:  112/63 110/64 (!) 94/52  Pulse:  (!) 103 93 89  Resp:      Temp: 99.2 F (37.3 C)     TempSrc: Oral     SpO2:      Weight:      Height:        Dilation: 9 Effacement (%): 100 Cervical Position: Posterior Station: Plus 2 Presentation: Vertex Exam by:: [email or web address] FHT: baseline rate 161, moderate varibility, positive acel, absent decel Toco: contractions occur every 3-4 minutes  Labs: Lab Results  Component Value Date   WBC 9.2 03/15/2020   HGB 12.4 03/15/2020   HCT 37.2 03/15/2020   MCV 87.9 03/15/2020   PLT 169 03/15/2020    Patient Active Problem List   Diagnosis Date Noted  . Gestational diabetes mellitus (GDM) affecting pregnancy, antepartum 12/29/2019  . Obesity affecting pregnancy 10/31/2019  . Supervision of other normal pregnancy, antepartum 09/05/2019  . Ovarian dermoid cyst complicating pregnancy, antepartum, unspecified trimester 08/13/2019  . Migraines 08/11/2014  . History of cesarean delivery, currently pregnant 01/17/2007    Assessment / Plan: 34 y.o. G3P1011 at [redacted]w[redacted]d here for IOL/TOLAC, progressing well.  Labor: continue with pitocin at 16 and increasing by 2 Fetal Wellbeing:  cateogory I tracing  Pain Control:  Epidural  GBS: negative Triple I: Vanc and Gent for PCN allergy with hives, last temp 99.2 down from 100.8 previously GDMA1: patient received 1 amp of D50 for low BG of 46>129>224>given 4 units of insulin asp, continue to monitor CBG  Anticipated MOD:  Vaginal delivery    Stark Klein, MD Family Medicine, PGY-1  03/16/2020, 12:31 PM

## 2020-03-16 NOTE — Progress Notes (Signed)
LABOR PROGRESS NOTE  Judy James is a 34 y.o. G3P1011 at [redacted]w[redacted]d  admitted for IOL/TOLAC.  Subjective: Patient reports HA 2/2 to pressure of pushing.   Objective: BP 107/66   Pulse 99   Temp (!) 101.2 F (38.4 C) (Oral)   Resp 16   Ht 5\' 1"  (1.549 m)   Wt 89.2 kg   LMP 06/16/2019 (Exact Date)   SpO2 100%   BMI 37.17 kg/m  or  Vitals:   03/16/20 1501 03/16/20 1530 03/16/20 1705 03/16/20 1717  BP: 113/69  107/66   Pulse: (!) 112  99   Resp:      Temp:  99.8 F (37.7 C)  (!) 101.2 F (38.4 C)  TempSrc:  Oral  Oral  SpO2:   100%   Weight:      Height:        Dilation: 10 Dilation Complete Date: 03/16/20 Dilation Complete Time: 1532 Effacement (%): 100 Cervical Position: Posterior Station: Plus 3 Presentation: Vertex Exam by:: Dr. Dione Plover FHT: baseline rate , moderate varibility, +acel, +variable decel Toco: contractions present, 5-6 mins in frequency   Labs: Lab Results  Component Value Date   WBC 9.2 03/15/2020   HGB 12.4 03/15/2020   HCT 37.2 03/15/2020   MCV 87.9 03/15/2020   PLT 169 03/15/2020    Patient Active Problem List   Diagnosis Date Noted  . Gestational diabetes mellitus (GDM) affecting pregnancy, antepartum 12/29/2019  . Obesity affecting pregnancy 10/31/2019  . Supervision of other normal pregnancy, antepartum 09/05/2019  . Ovarian dermoid cyst complicating pregnancy, antepartum, unspecified trimester 08/13/2019  . Migraines 08/11/2014  . History of cesarean delivery, currently pregnant 01/17/2007    Assessment / Plan: 34 y.o. G3P1011 at [redacted]w[redacted]d here for IOL/TOLAC.  Labor: pitocin Fetal Wellbeing:  Category I strip  Pain Control:  Epidural  GBS: negative  Triple I : Vanc and gent, last temp elevated to 101.2,  Anticipated MOD:  Vaginal delivery, potentially vacuum assisted    Stark Klein, MD Family Medicine, PGY-1  03/16/2020, 5:22 PM

## 2020-03-16 NOTE — Progress Notes (Signed)
LABOR PROGRESS NOTE  Judy James is a 34 y.o. G3P1011 at [redacted]w[redacted]d  admitted for IOL/TOLAC.  Subjective: Feeling nauseous and shaky   Objective: BP 90/68   Pulse (!) 115   Temp 98.9 F (37.2 C) (Oral)   Resp 16   Ht 5\' 1"  (1.549 m)   Wt 89.2 kg   LMP 06/16/2019 (Exact Date)   SpO2 100%   BMI 37.17 kg/m  or  Vitals:   03/16/20 0801 03/16/20 0910 03/16/20 1010 03/16/20 1013  BP: (!) 104/58 (!) 99/53 105/71 90/68  Pulse: 76 83 (!) 102 (!) 115  Resp:      Temp:      TempSrc:      SpO2: 100%  100%   Weight:      Height:         Dilation: 8.5 Effacement (%): 100 Cervical Position: Posterior Station: Plus 1 Presentation: Vertex Exam by:: Dr. Dione Plover FHT: baseline rate 135, moderate varibility, positive acel, mix of variable and late decels Toco: IUPC placed, contractions every 2-3 minutes, inadequate   Labs: Lab Results  Component Value Date   WBC 9.2 03/15/2020   HGB 12.4 03/15/2020   HCT 37.2 03/15/2020   MCV 87.9 03/15/2020   PLT 169 03/15/2020    Patient Active Problem List   Diagnosis Date Noted  . Gestational diabetes mellitus (GDM) affecting pregnancy, antepartum 12/29/2019  . Obesity affecting pregnancy 10/31/2019  . Supervision of other normal pregnancy, antepartum 09/05/2019  . Ovarian dermoid cyst complicating pregnancy, antepartum, unspecified trimester 08/13/2019  . Migraines 08/11/2014  . History of cesarean delivery, currently pregnant 01/17/2007    Assessment / Plan: 34 y.o. G3P1011 at [redacted]w[redacted]d here for IOL/TOLAC.  Labor: s/p FB x2 and pit since 1110. S/p AROM. MVU's nearing adequate and making cervical change. Called to bedside due to hypoglycemia and poor tracing with variables and late decelerations. Patient BP noted to be low, given dose of phenylephrine. Pitocin briefly stopped and resumed at 12u after tracing had stabilized. Also given bolus of IVF. Triple I: patient with temperature of 100.81F while being evaluated. Given prior labor course  complicated by triple I will start abx at this time. Fetal Wellbeing: Cat I  Pain Control:  Epidural  GBS: Negative  Anticipated MOD:  Vaginal delivery  GDMA1: last glucose 46, given amp of D50% with improvement in sugars. CTM closely   Clarnce Flock MD/MP[facility] OB Fellow  03/16/2020, 10:20 AM

## 2020-03-16 NOTE — Anesthesia Preprocedure Evaluation (Signed)
Anesthesia Evaluation  Patient identified by MRN, date of birth, ID band Patient awake    Reviewed: Allergy & Precautions, H&P , NPO status , Patient's Chart, lab work & pertinent test results, reviewed documented beta blocker date and time   Airway Mallampati: II  TM Distance: >3 FB Neck ROM: full    Dental no notable dental hx.    Pulmonary neg pulmonary ROS,    Pulmonary exam normal breath sounds clear to auscultation       Cardiovascular negative cardio ROS Normal cardiovascular exam Rhythm:regular Rate:Normal     Neuro/Psych negative neurological ROS  negative psych ROS   GI/Hepatic negative GI ROS, Neg liver ROS,   Endo/Other  diabetes, GestationalMorbid obesity  Renal/GU negative Renal ROS  negative genitourinary   Musculoskeletal   Abdominal (+) + obese,   Peds  Hematology negative hematology ROS (+)   Anesthesia Other Findings   Reproductive/Obstetrics (+) Pregnancy                             Anesthesia Physical Anesthesia Plan  ASA: III  Anesthesia Plan: Epidural   Post-op Pain Management:    Induction:   PONV Risk Score and Plan:   Airway Management Planned:   Additional Equipment:   Intra-op Plan:   Post-operative Plan:   Informed Consent: I have reviewed the patients History and Physical, chart, labs and discussed the procedure including the risks, benefits and alternatives for the proposed anesthesia with the patient or authorized representative who has indicated his/her understanding and acceptance.     Dental Advisory Given  Plan Discussed with:   Anesthesia Plan Comments: (Labs checked- platelets confirmed with RN in room. Fetal heart tracing, per RN, reported to be stable enough for sitting procedure. Discussed epidural, and patient consents to the procedure:  included risk of possible headache,backache, failed block, allergic reaction, and nerve  injury. This patient was asked if she had any questions or concerns before the procedure started.)        Anesthesia Quick Evaluation

## 2020-03-16 NOTE — Progress Notes (Signed)
LABOR PROGRESS NOTE  Judy James is a 34 y.o. G3P1011 at [redacted]w[redacted]d  admitted for IOL/TOLAC.  Subjective: Patient frustrated with lack of progress   Objective: BP 98/65   Pulse (!) 111   Temp 99.5 F (37.5 C) (Oral)   Resp 16   Ht 5\' 1"  (1.549 m)   Wt 89.2 kg   LMP 06/16/2019 (Exact Date)   SpO2 100%   BMI 37.17 kg/m  or  Vitals:   03/16/20 1717 03/16/20 1800 03/16/20 1827 03/16/20 1858  BP:  102/75  98/65  Pulse:  (!) 137  (!) 111  Resp:      Temp: (!) 101.2 F (38.4 C)  99.5 F (37.5 C)   TempSrc: Oral  Oral   SpO2:      Weight:      Height:        Dilation: 10 Dilation Complete Date: 03/16/20 Dilation Complete Time: 1532 Effacement (%): 100 Cervical Position: Posterior Station: Plus 3 Presentation: Vertex Exam by:: Dr. Dione Plover FHT: baseline rate wandering, moderate varibility, +acel, +variable decel Toco: q5-6 mins  Labs: Lab Results  Component Value Date   WBC 21.2 (H) 03/16/2020   HGB 12.9 03/16/2020   HCT 38.5 03/16/2020   MCV 88.1 03/16/2020   PLT 170 03/16/2020    Patient Active Problem List   Diagnosis Date Noted  . Gestational diabetes mellitus (GDM) affecting pregnancy, antepartum 12/29/2019  . Obesity affecting pregnancy 10/31/2019  . Supervision of other normal pregnancy, antepartum 09/05/2019  . Ovarian dermoid cyst complicating pregnancy, antepartum, unspecified trimester 08/13/2019  . Migraines 08/11/2014  . History of cesarean delivery, currently pregnant 01/17/2007    Assessment / Plan: 34 y.o. G3P1011 at [redacted]w[redacted]d here for IOL/TOLAC.  Labor: s/p FB x2 and pit since 1110. S/p AROM. Has been complete and pushing but only to +1, not quite yet +2 station despite 2.5 hours of pushing. Discussed that infant too high for vacuum and recommended proceeding to repeat cesarean. After deliberation patient is in agreement with the plan.  The risks of cesarean section discussed with the patient included but were not limited to: bleeding which may  require transfusion or reoperation; infection which may require antibiotics; injury to bowel, bladder, ureters or other surrounding organs; injury to the fetus; need for additional procedures including hysterectomy in the event of a life-threatening hemorrhage; placental abnormalities with subsequent pregnancies, incisional problems, thromboembolic phenomenon and other postoperative/anesthesia complications. The patient concurred with the proposed plan, giving informed written consent for the procedure. Patient has been NPO since last night she will remain NPO for procedure. Anesthesia and OR aware. Preoperative prophylactic antibiotics and SCDs ordered on call to the OR. To OR when ready.   Fetal Wellbeing:  Category II for variables but overall reassuring with moderate variability and accels Pain Control:  Epidural  GBS: negative  Triple I : Vanc and gent, add clinda for OR, cont 24h post op Anticipated MOD:  CS  Clarnce Flock MD/[facility] OB Fellow 7:22 PM

## 2020-03-16 NOTE — Transfer of Care (Signed)
Immediate Anesthesia Transfer of Care Note  Patient: Judy James  Procedure(s) Performed: CESAREAN SECTION (N/A )  Patient Location: PACU  Anesthesia Type:Epidural  Level of Consciousness: awake, alert , oriented and patient cooperative  Airway & Oxygen Therapy: Patient Spontanous Breathing  Post-op Assessment: Report given to RN, Post -op Vital signs reviewed and stable and Patient moving all extremities X 4  Post vital signs: Reviewed and stable  Last Vitals:  Vitals Value Taken Time  BP 97/71 03/16/20 2100  Temp 36.7 C 03/16/20 2055  Pulse 78 03/16/20 2105  Resp 22 03/16/20 2105  SpO2 100 % 03/16/20 2105  Vitals shown include unvalidated device data.  Last Pain:  Vitals:   03/16/20 2055  TempSrc:   PainSc: 7       Patients Stated Pain Goal: 3 ([id number])  Complications: No apparent anesthesia complications

## 2020-03-16 NOTE — Progress Notes (Signed)
Hypoglycemic Event Initial CBG 60. Given 8oz of apple juice and one [place] ice.  CBG: 55 @0939 . Provider notified and stated continue PO tx.  Treatment: 8oz of apple juice with sugar added.   Symptoms: Nauseated, jittery.  Follow-up CBG: Time:46 @0952    Treatment: 12.5g IV dextrose 50% @ 0955 per Dr. Dione Plover.  Symptoms: nauseated, jittery, lethargy, decreased LOC. VS stable. 12.5g IV dextrose 50% @ 1002.   CBG Result:129 @ 1005    Dr. Dione Plover at the bedside during event.    Judy James

## 2020-03-16 NOTE — Anesthesia Procedure Notes (Signed)
Epidural Patient location during procedure: OB Start time: 03/15/2020 10:10 PM End time: 03/15/2020 10:15 PM  Staffing Anesthesiologist: Janeece Riggers, MD  Preanesthetic Checklist Completed: patient identified, IV checked, site marked, risks and benefits discussed, surgical consent, monitors and equipment checked, pre-op evaluation and timeout performed  Epidural Patient position: sitting Prep: DuraPrep and site prepped and draped Patient monitoring: continuous pulse ox and blood pressure Approach: midline Location: L3-L4 Injection technique: LOR air  Needle:  Needle type: Tuohy  Needle gauge: 17 G Needle length: 9 cm and 9 Needle insertion depth: 7 cm Catheter type: closed end flexible Catheter size: 19 Gauge Catheter at skin depth: 12 cm Test dose: negative  Assessment Events: blood not aspirated, injection not painful, no injection resistance, no paresthesia and negative IV test

## 2020-03-16 NOTE — Progress Notes (Signed)
LABOR PROGRESS NOTE  Judy James is a 34 y.o. G3P1011 at [redacted]w[redacted]d admitted for TOLAC with IOL.  Subjective: Comfortable with epidural.   Objective: BP 98/66   Pulse 67   Temp 98.4 F (36.9 C) (Oral)   Resp 15   Ht 5\' 1"  (1.549 m)   Wt 89.2 kg   LMP 06/16/2019 (Exact Date)   SpO2 100%   BMI 37.17 kg/m  or  Vitals:   03/16/20 0334 03/16/20 0400 03/16/20 0401 03/16/20 0450  BP: 107/60  98/66   Pulse: 71  67   Resp:    15  Temp:    98.4 F (36.9 C)  TempSrc:    Oral  SpO2: 100% 100%    Weight:      Height:       Dilation: 6 Effacement (%): 70, 80 Cervical Position: Posterior Station: -2, -1 Presentation: Vertex Exam by:: C Cornetto RN FHT: baseline rate 125, moderate varibility, no accels, absent decel Toco: q2-50m  Labs: Lab Results  Component Value Date   WBC 9.2 03/15/2020   HGB 12.4 03/15/2020   HCT 37.2 03/15/2020   MCV 87.9 03/15/2020   PLT 169 03/15/2020    Patient Active Problem List   Diagnosis Date Noted  . Gestational diabetes mellitus (GDM) affecting pregnancy, antepartum 12/29/2019  . Obesity affecting pregnancy 10/31/2019  . Supervision of other normal pregnancy, antepartum 09/05/2019  . Ovarian dermoid cyst complicating pregnancy, antepartum, unspecified trimester 08/13/2019  . Migraines 08/11/2014  . History of cesarean delivery, currently pregnant 01/17/2007    Assessment / Plan: 34 y.o. G3P1011 at [redacted]w[redacted]d here for scheduled IOL/TOLAC.  Labor: s/p FB x2 and pit since 1110. S/p AROM. MVU's nearing adeuqate. Baby LOT/LOA. Once adequate MVU's, anticipate VBAC. Fetal Wellbeing: Cat I  Pain Control:  Epidural  GBS: Negative  Anticipated MOD:  Vaginal delivery  GDMA1: last glucose 93   Chauncey Mann MD OB Fellow 03/16/2020, 5:37 AM

## 2020-03-16 NOTE — Progress Notes (Signed)
Inpatient Diabetes Program Recommendations  Diabetes Treatment Program Recommendations  ADA Standards of Care 2018 Diabetes in Pregnancy Target Glucose Ranges:  Fasting: 60 - 90 mg/dL Preprandial: 60 - 105 mg/dL 1 hr postprandial: Less than 140mg /dL (from first bite of meal) 2 hr postprandial: Less than 120 mg/dL (from first bite of meal)    Lab Results  Component Value Date   GLUCAP 142 (H) 03/16/2020    Review of Glycemic Control Results for Judy James, Judy James (MRN [id number]) as of 03/16/2020 14:48  Ref. Range 03/16/2020 09:39 03/16/2020 09:52 03/16/2020 10:05 03/16/2020 11:23 03/16/2020 13:36  Glucose-Capillary Latest Ref Range: 70 - 99 mg/dL 55 (L) 46 (L) 129 (H) 224 (H) 142 (H)   Diabetes history: GDM Outpatient Diabetes medications: none Current orders for Inpatient glycemic control:  Dextrose 50% 12.5 x 2 Novolog 4 units x 1 Inpatient Diabetes Program Recommendations:    Noted hypoglycemic event of 46 mg/dL; patient symptomatic. Unclear reason for event, assuming related to labor process and over utilization of glucose.  Discussed with RN and patient received > 35 grams of CHO, thus explaining CBG of 224 mg/dL. Reviewed hypoglycemia protocol.  Will follow.  Thanks, Bronson Curb, MSN, CDE, RNC-OB Diabetes Coordinator [phone] (8a-5p)

## 2020-03-16 NOTE — Discharge Summary (Addendum)
Postpartum Discharge Summary    Patient Name: Judy James DOB: 13-Oct-1986 MRN: [id number]  Date of admission: 03/15/2020 Delivery date:03/16/2020  Delivering provider: Clarnce Flock  Date of discharge: 03/18/2020  Admitting diagnosis: Gestational diabetes mellitus (GDM) affecting pregnancy, antepartum [O24.419] Intrauterine pregnancy: [redacted]w[redacted]d    Secondary diagnosis:  Active Problems:   Migraines   Ovarian dermoid cyst complicating pregnancy, antepartum, unspecified trimester   Supervision of other normal pregnancy, antepartum   History of cesarean delivery, currently pregnant   Obesity affecting pregnancy   Gestational diabetes mellitus (GDM) affecting pregnancy, antepartum   Chorioamnionitis   Cesarean delivery delivered  Additional problems: Triple I/ Arrest of Descent leading to repeat c/s    Discharge diagnosis: Term Pregnancy Delivered and GDM A1                                              Post partum procedures:None Augmentation: AROM, Pitocin, OP Foley and IP Foley Complications: Intrauterine Inflammation or infection (Chorioamniotis)  Hospital course: Induction of Labor With Cesarean Section   34y.o. yo [name] [id number] admitted to the hospital 03/15/2020 for induction of labor. Patient had a labor course significant for: Patient a [id number]'s induction was started the day prior for GDMA1 at 3910w0dhe was induced with foley bulb x2 and started on pitocin at 1110. She was continued on pitocin and had AROM at approximately 12 hours after arrival. She progressed to 6 cm at midnight the day after arrival and then progressed to complete and +1 station at 1500. She pushed for 2.5 hours with no descent, at which point she was recommended for a cesarean. Initially FHT Cat II for intermittent variables but overall reassuring with moderate variability and accelerations, however once she was taken to the OR fetal heart tones noted to be in 80's, at which point the procedure  was done stat. . The patient went for cesarean section due to Arrest of Descent and Non-Reassuring FHR. Delivery details are as follows: Membrane Rupture Time/Date: 6:35 PM ,03/15/2020   Delivery Method:C-Section, Low Vertical  Details of operation can be found in separate operative Note.  Patient had an uncomplicated postpartum course. She is ambulating, tolerating a regular diet, passing flatus, and urinating well.  Patient is discharged home in stable condition on 03/18/20.      Newborn Data: Birth date:03/16/2020  Birth time:7:51 PM  Gender:Female  Living status:Living  Apgars:2 ,9  Weight:3600 g                                 Magnesium Sulfate received: No BMZ received: No Rhophylac:N/A MMR:N/A T-DaP:Declined prenatally Flu: Declined prenatally Transfusion:No  Physical exam  Vitals:   03/17/20 0410 03/17/20 0900 03/17/20 1251 03/18/20 0627  BP: 133/79 (!) 108/57 (!) 101/54 98/60  Pulse: 70 65 75 75  Resp: '18 16 18   '$ Temp: 98.6 F (37 C) 99.1 F (37.3 C) 97.7 F (36.5 C) 98.4 F (36.9 C)  TempSrc: Oral Oral Oral Oral  SpO2: 99% 95% 95%   Weight:      Height:       General: alert, cooperative and no distress Lochia: appropriate Uterine Fundus: firm Incision: Healing well with no significant drainage, Dressing is clean, dry, and intact DVT Evaluation: No evidence of DVT seen on physical  exam. Negative Homan's sign. Calf/Ankle edema is present Labs: Lab Results  Component Value Date   WBC 23.8 (H) 03/18/2020   HGB 11.2 (L) 03/18/2020   HCT 32.5 (L) 03/18/2020   MCV 86.9 03/18/2020   PLT 183 03/18/2020   CMP Latest Ref Rng & Units 03/18/2020  Glucose 70 - 99 mg/dL 88  BUN 6 - 20 mg/dL 13  Creatinine 0.44 - 1.00 mg/dL 1.31(H)  Sodium 135 - 145 mmol/L 134(L)  Potassium 3.5 - 5.1 mmol/L 4.0  Chloride 98 - 111 mmol/L 107  CO2 22 - 32 mmol/L 18(L)  Calcium 8.9 - 10.3 mg/dL 7.6(L)  Total Protein 6.5 - 8.1 g/dL 4.9(L)  Total Bilirubin 0.3 - 1.2 mg/dL 0.3   Alkaline Phos 38 - 126 U/L 82  AST 15 - 41 U/L 40  ALT 0 - 44 U/L 18   Edinburgh Score: Edinburgh Postnatal Depression Scale Screening Tool 03/17/2020  I have been able to laugh and see the funny side of things. (No Data)     After visit meds:  Allergies as of 03/18/2020      Reactions   Penicillin G Hives      Medication List    STOP taking these medications   Accu-Chek Guide w/Device Kit   Accu-Chek Softclix Lancets lancets   aspirin EC 81 MG tablet   Blood Pressure Kit Devi   glucose blood test strip   terconazole 0.8 % vaginal cream Commonly known as: TERAZOL 3     TAKE these medications   Comfort Fit Maternity Supp Lg Misc 1 Units by Does not apply route daily as needed.   docusate sodium 100 MG capsule Commonly known as: COLACE Take 1 capsule (100 mg total) by mouth 2 (two) times daily as needed.   ferrous sulfate 325 (65 FE) MG tablet Take 1 tablet (325 mg total) by mouth 2 (two) times daily with a meal.   multivitamin-prenatal 27-0.8 MG Tabs tablet Take 1 tablet by mouth daily at 12 noon.   ondansetron 4 MG disintegrating tablet Commonly known as: Zofran ODT Take 1 tablet (4 mg total) by mouth every 8 (eight) hours as needed for nausea or vomiting.   simethicone 80 MG chewable tablet Commonly known as: Gas-X Chew 1 tablet (80 mg total) by mouth every 6 (six) hours as needed for flatulence.        Discharge home in stable condition Infant Feeding: Breast Infant Disposition:home with mother Discharge instruction: per After Visit Summary and Postpartum booklet. Activity: Advance as tolerated. Pelvic rest for 6 weeks.  Diet: carb modified diet Future Appointments: Future Appointments  Date Time Provider [place]  03/30/2020 10:20 AM [place] None  04/28/2020  9:00 AM [facility] None  04/28/2020  9:45 AM Sloan Leiter, MD [facility] None   Follow up Visit:   Please schedule this patient for a In person postpartum  visit in 6 weeks with the following provider: Any provider. Additional Postpartum F/U:2 hour GTT and Incision check 2 weeks  High risk pregnancy complicated by: GDM Delivery mode:  C-Section, Low Vertical  Anticipated Birth Control:  NFP   03/18/2020 Stark Klein, MD  CNM attestation I have seen and examined this patient and agree with above documentation in the resident's note.   Judy James is a 34 y.o. [id number] s/p rLTCS.   Pain is well controlled.  Plan for birth control is natural family planning (NFP).  Method of Feeding: breast  PE:  BP 98/60 (  BP Location: Right Arm)   Pulse 75   Temp 98.4 F (36.9 C) (Oral)   Resp 18   Ht '5\' 1"'$  (1.549 m)   Wt 89.2 kg   LMP 06/16/2019 (Exact Date)   SpO2 95%   Breastfeeding Unknown   BMI 37.17 kg/m  Fundus firm  Recent Labs    03/17/20 0506 03/18/20 0608  HGB 13.2 11.2*  HCT 38.1 32.5*     Plan: discharge today - postpartum care discussed - f/u clinic in 1-2wks for incision check, then 4-6 weeks for postpartum visit with GTT   Myrtis Ser, CNM 8:43 AM  03/18/2020

## 2020-03-16 NOTE — Op Note (Signed)
[facility] PROCEDURE DATE: 03/15/2020 - 03/16/2020  PREOPERATIVE DIAGNOSIS: Intrauterine pregnancy at  [redacted]w[redacted]d weeks gestation; failure to progress: arrest of descent and non-reassuring fetal status  POSTOPERATIVE DIAGNOSIS: The same  PROCEDURE: Repeat Low Transverse Cesarean Section  SURGEON:  Dr. Loma Boston  ASSISTANT: Dr. Clayton Lefort  INDICATIONS: DRISANA HUI is a 34 y.o. [id number] at [redacted]w[redacted]d scheduled for cesarean section secondary to the indications listed above.    Patient a [id number] who's induction was started the day prior for GDMA1 at [redacted]w[redacted]d. She was induced with foley bulb x2 and started on pitocin at 1110. She was continued on pitocin and had AROM at approximately 12 hours after arrival. She progressed to 6 cm at midnight the day after arrival and then progressed to complete and +1 station at 1500. She pushed for 2.5 hours with no descent, at which point she was recommended for a cesarean. Initially FHT Cat II for intermittent variables but overall reassuring with moderate variability and accelerations, however once she was taken to the OR fetal heart tones noted to be in 80's, at which point the procedure was done stat.   Prior to arrival to the operating room the risks of cesarean section discussed with the patient included but were not limited to: bleeding which may require transfusion or reoperation; infection which may require antibiotics; injury to bowel, bladder, ureters or other surrounding organs; injury to the fetus; need for additional procedures including hysterectomy in the event of a life-threatening hemorrhage; placental abnormalities wth subsequent pregnancies, incisional problems, thromboembolic phenomenon and other postoperative/anesthesia complications. The patient concurred with the proposed plan, giving informed written consent for the procedure.    FINDINGS: Moderate adhesions, high riding bladder. Viable female infant in cephalic presentation.  Apgars 2 and 9,  weight 3600 grams.  Clear amniotic fluid.  Intact placenta, three vessel cord.  Normal uterus and fallopian tubes, small cysts on ovaries bilaterally.  ANESTHESIA:    Spinal INTRAVENOUS FLUIDS: 2250 ml ESTIMATED BLOOD LOSS: 337 ml URINE OUTPUT:  100 ml SPECIMENS: Placenta sent to pathology COMPLICATIONS: None immediate  PROCEDURE IN DETAIL:  The patient received intravenous antibiotics and had sequential compression devices applied to her lower extremities while in the preoperative area.  She was then taken to the operating room where epidural anesthesia was dosed up to surgical level and was found to be adequate. She was then placed in a dorsal supine position with a leftward tilt, and prepped and draped in a sterile manner.  A foley catheter was placed into her bladder and attached to constant gravity, which drained clear fluid throughout.  After an adequate timeout was performed, a Pfannenstiel skin incision was made with scalpel and carried through to the underlying layer of fascia. The fascia was incised in the midline and this incision was extended bilaterally bluntly. The rectus muscles were separated in the midline bluntly and the peritoneum was entered bluntly, at which point an edematous and distended bladder was encountered covering most of the lower uterine segment. Due to lack of space adhesions were taken down on patient's left sharply with Mayo scissors, and then a Maylard incision was made in the right rectus muscle. An attempt was made to place the [facility] retractor, however due to the high riding bladder a Rich retractor and bladder retractor was placed. Attention was turned to the lower uterine segment where a transverse hysterotomy was made with a scalpel and extended bilaterally bluntly. The infant was successfully delivered, and cord was clamped and cut and  infant was handed over to awaiting neonatology team. Uterine massage was then administered and the placenta delivered intact with  three-vessel cord. The uterus was then cleared of clot and debris. Due to poor uterine tone TXA and methergine were given with slow improvement in tone. The hysterotomy was closed with 0 Vicryl in a running locked fashion, and an imbricating layer was also placed with a 0 Vicryl. Overall, excellent hemostasis was noted. The abdomen and the pelvis were cleared of all clot and debris and the Ubaldo Glassing was removed. Hemostasis was confirmed on all surfaces.  The peritoneum was not reapproximated. The fascia was then closed using 0 Vicryl in a running fashion. The subcutaneous layer was reapproximated with plain gut and the skin was closed with 4-0 vicryl. The patient tolerated the procedure well. Sponge, lap, instrument and needle counts were correct x 2. She was taken to the recovery room in stable condition.    Clarnce Flock, MD 03/16/2020 8:38 PM

## 2020-03-17 ENCOUNTER — Encounter (HOSPITAL_COMMUNITY): Payer: Self-pay | Admitting: Obstetrics & Gynecology

## 2020-03-17 LAB — COMPREHENSIVE METABOLIC PANEL
ALT: 15 U/L (ref 0–44)
AST: 38 U/L (ref 15–41)
Albumin: 2.4 g/dL — ABNORMAL LOW (ref 3.5–5.0)
Alkaline Phosphatase: 94 U/L (ref 38–126)
Anion gap: 10 (ref 5–15)
BUN: <5 mg/dL — ABNORMAL LOW (ref 6–20)
CO2: 18 mmol/L — ABNORMAL LOW (ref 22–32)
Calcium: 8.4 mg/dL — ABNORMAL LOW (ref 8.9–10.3)
Chloride: 105 mmol/L (ref 98–111)
Creatinine, Ser: 1.7 mg/dL — ABNORMAL HIGH (ref 0.44–1.00)
GFR calc Af Amer: 46 mL/min — ABNORMAL LOW (ref >60)
GFR calc non Af Amer: 39 mL/min — ABNORMAL LOW (ref >60)
Glucose, Bld: 121 mg/dL — ABNORMAL HIGH (ref 70–99)
Potassium: 4.3 mmol/L (ref 3.5–5.1)
Sodium: 133 mmol/L — ABNORMAL LOW (ref 135–145)
Total Bilirubin: 1.2 mg/dL (ref 0.3–1.2)
Total Protein: 5 g/dL — ABNORMAL LOW (ref 6.5–8.1)

## 2020-03-17 LAB — CBC
HCT: 38.1 % (ref 36.0–46.0)
Hemoglobin: 13.2 g/dL (ref 12.0–15.0)
MCH: 30.1 pg (ref 26.0–34.0)
MCHC: 34.6 g/dL (ref 30.0–36.0)
MCV: 86.8 fL (ref 80.0–100.0)
Platelets: 145 10*3/uL — ABNORMAL LOW (ref 150–400)
RBC: 4.39 MIL/uL (ref 3.87–5.11)
RDW: 13.7 % (ref 11.5–15.5)
WBC: 31.1 10*3/uL — ABNORMAL HIGH (ref 4.0–10.5)
nRBC: 0 % (ref 0.0–0.2)

## 2020-03-17 LAB — GLUCOSE, CAPILLARY: Glucose-Capillary: 132 mg/dL — ABNORMAL HIGH (ref 70–99)

## 2020-03-17 MED ORDER — VANCOMYCIN HCL 10 G IV SOLR
1500.0000 mg | INTRAVENOUS | Status: AC
  Administered 2020-03-17: 1500 mg via INTRAVENOUS
  Filled 2020-03-17: qty 1500

## 2020-03-17 MED ORDER — GENTAMICIN SULFATE 40 MG/ML IJ SOLN: 5.0000 mg/kg | INTRAVENOUS | Status: DC

## 2020-03-17 MED ORDER — GENTAMICIN IN SALINE 1-0.9 MG/ML-% IV SOLN
100.0000 mg | Freq: Two times a day (BID) | INTRAVENOUS | Status: AC
  Administered 2020-03-17: 100 mg via INTRAVENOUS
  Filled 2020-03-17: qty 100

## 2020-03-17 MED ORDER — SODIUM CHLORIDE 0.9 % IV BOLUS
1000.0000 mL | Freq: Once | INTRAVENOUS | Status: AC
  Administered 2020-03-17: 1000 mL via INTRAVENOUS

## 2020-03-17 NOTE — Lactation Note (Signed)
This note was copied from a baby's chart. Lactation Consultation Note  Patient Name: Judy James [id number] Date: 03/17/2020 Reason for consult: Follow-up assessment;Difficult latch  Baby is 16 hours .  Judy James reported to this [facility] Dr. Earle James wanted mom / baby to be seen for breastfeeding  Assist.  As LC entered the room dad was holding baby and mom resting in bed.  Mom mentioned baby needs to eat by 12 n.  [place] 1st worked with mom to breast massage,hand express and the left breast noted to  Have areola edema with semi compressible areola, and only able to express tiny drop.  The right areola more compressible and was able to express 1 ml.  LC woke baby up , checked diaper , noted to be dry, and spoon fed the baby 1 ml with a spoon.  Tolerated well. Baby more awake and rooting, LC 1st tried to latch on the left , baby unable to open wide enough to get his mouth of the nipple onto the areola.  Switched to the right breast/ areola more compressible and after attempts the baby latched for 5 mins and released . 2nd try with assisted latched for 12 mins with swallows / increased with moist heat and compressions and per mom was feeling a good tug.  Baby released on his own and fell asleep.  LC Plan ::  Shells between feedings when mom can wear a bra ( not when sleeping )  Prior to latching - breast massage, hand express , pre-pump with hand pump to stretch the nipple / areola complex and reverse pressure.  Latch with firm support and STS, work with football position until the baby is latching consistently. Breast feeding goals 8-12 times in 24 hours.  Offer the 2nd breast if the baby is still hungry and hand express between feedings.  After feeding post pump both breast for 15 -20 mins until  Milk comes in .  Judy James informed Judy James of LC's consult findings .     Maternal Data    Feeding Feeding Type: Breast Fed  LATCH Score Latch: Grasps breast easily, tongue down, lips flanged, rhythmical  sucking.  Audible Swallowing: A few with stimulation(increased to 2)  Type of Nipple: Everted at rest and after stimulation  Comfort (Breast/Nipple): Soft / non-tender  Hold (Positioning): Assistance needed to correctly position infant at breast and maintain latch.  LATCH Score: 8  Interventions Interventions: Breast feeding basics reviewed;Assisted with latch;Skin to skin;Breast massage;Hand express;Pre-pump if needed;Reverse pressure;Breast compression;Adjust position;Position options;Support pillows;Shells;Hand pump;DEBP  Lactation Tools Discussed/Used Tools: Shells;Pump Nipple shield size: (did not need to use the Nipple Shield to latch) Shell Type: Inverted Breast pump type: Double-Electric Breast Pump;Manual   Consult Status Consult Status: Follow-up Date: 03/18/20 Follow-up type: In-patient    [place] 03/17/2020, 12:36 PM

## 2020-03-17 NOTE — Anesthesia Postprocedure Evaluation (Signed)
Anesthesia Post Note  Patient: Judy James  Procedure(s) Performed: CESAREAN SECTION (N/A )     Patient location during evaluation: Mother Baby Anesthesia Type: Epidural Level of consciousness: awake and alert Pain management: pain level controlled Vital Signs Assessment: post-procedure vital signs reviewed and stable Respiratory status: spontaneous breathing, nonlabored ventilation and respiratory function stable Cardiovascular status: stable Postop Assessment: no headache, no backache and epidural receding Anesthetic complications: no    Last Vitals:  Vitals:   03/17/20 0010 03/17/20 0410  BP: (!) 111/56 133/79  Pulse: 64 70  Resp: 19 18  Temp: 37.3 C 37 C  SpO2: 94% 99%    Last Pain:  Vitals:   03/17/20 0410  TempSrc: Oral  PainSc:    Pain Goal: Patients Stated Pain Goal: 3 (03/16/20 2150)                 [name]

## 2020-03-17 NOTE — Lactation Note (Addendum)
This note was copied from a baby's chart. Lactation Consultation Note  Patient Name: Judy James [id number] Date: 03/17/2020 Reason for consult: Initial assessment;Difficult latch;Term P2, 4 hour term female infant, low blood sugar. Mom with hx: C/S delivery. Per mom, infant not latching at breast, she was given 24 mm NS by RN. Mom is active on the [facility] program in [facility] and she has DEBP at home. Tools given: 24 mm NS by RN, hand pump and DEBP due to having flat nipples with her right nipple  only-inverted. Per mom, she BF her 17 year old daughter for 4 months but stopped due to low milk supply, her daughter was given breast milk until 7 months due to her having a lot stored in her freezer. LC entered room, infant was cuing to breastfeed, few attempts was made latch infant without NS infant did not sustain latch even when mom used tea cup hold infant would not form seal around breast. LC assisted mom in applying 24 mm NS, mom latched infant on her left breast using football hold position and she used 'U hold" support her breast, infant latched sustained latch, swallows heard "cuh" and infant breastfed for 15 minutes. LC discussed hand expression and infant was given 6 mls of colostrum by spoon after breastfeeding. Due to using 24 mm NS mom will use DEBP every 3 hours for 15 minutes to help with breast stimulation and establishing her milk supply. Mom will make attempts to latch infant without NS in future, will pre-pump and attempt to latch infant without NS.  Mom understand NS usage is short term not long term use. Mom knows to breastfeed according to hunger cues, 8 to 12+ times within 24 hours and not to exceed 3 hours without breastfeeding infant. Parents will do as much STS as possible. Mom made aware of O/P services, breastfeeding support groups, community resources, and our phone # for post-discharge questions.  Maternal Data Formula Feeding for Exclusion: No Has patient been  taught Hand Expression?: Yes Does the patient have breastfeeding experience prior to this delivery?: Yes  Feeding Feeding Type: Breast Fed  LATCH Score Latch: Grasps breast easily, tongue down, lips flanged, rhythmical sucking.  Audible Swallowing: Spontaneous and intermittent  Type of Nipple: Flat  Comfort (Breast/Nipple): Soft / non-tender  Hold (Positioning): Assistance needed to correctly position infant at breast and maintain latch.  LATCH Score: 8  Interventions Interventions: Breast feeding basics reviewed;Breast compression;Assisted with latch;Adjust position;Skin to skin;Support pillows;Breast massage;Position options;Hand express;Expressed milk;Pre-pump if needed;DEBP;Hand pump  Lactation Tools Discussed/Used WIC Program: Yes Pump Review: Setup, frequency, and cleaning;Milk Storage Initiated by:: Vicente Serene, IBCLC Date initiated:: 03/17/20   Consult Status Consult Status: Follow-up Date: 03/17/20 Follow-up type: In-patient    Vicente Serene 03/17/2020, 12:03 AM

## 2020-03-17 NOTE — Progress Notes (Addendum)
Post Partum Day 1  Subjective:  Judy James is a 34 y.o. [id number] [redacted]w[redacted]d s/p rLTCS after failed TOLAC.  No acute events overnight.  Pt denies problems with ambulating, voiding or po intake.  She denies nausea or vomiting.  Pain is well controlled.  She has had flatus. She has not had bowel movement.  Lochia Small.  Plan for birth control is natural family planning.  Method of Feeding: breast feeding.   Objective: BP 133/79 (BP Location: Right Arm)   Pulse 70   Temp 98.6 F (37 C) (Oral)   Resp 18   Ht 5\' 1"  (1.549 m)   Wt 89.2 kg   LMP 06/16/2019 (Exact Date)   SpO2 99%   Breastfeeding Unknown   BMI 37.17 kg/m   Physical Exam:  General: alert, cooperative and no distress Lochia:normal flow Chest: CTAB Heart: RRR no m/r/g Abdomen: +BS, soft, nontender, fundus firm at/below umbilicus Uterine Fundus: firm at level of umbilicus  DVT Evaluation: No evidence of DVT seen on physical exam. Extremities: BLE 1+ edema  Recent Labs    03/16/20 1835 03/17/20 0506  HGB 12.9 13.2  HCT 38.5 38.1    Assessment/Plan:  ASSESSMENT: Judy James is a 34 y.o. [id number] [redacted]w[redacted]d ppd #1 s/p rLTCS after failed TOLAC, doing well.   Triple I: complete 24 hours of Clinda/Gent/Vanc, monitor fever curve, no fevers overnight, monitor CBC in AM(leukocytosis 31<21)   AKI: likely due to obstruction during TOLAC, Cr improved from 1.99 to 1.70, UOP 1049mL in last 24 hours, continue to monitor UOP and will recheck CMP in AM   GDMA1: AM glucose PP 121  Circumcision prior to discharge   Dispo: potential discharge 03/19/20   LOS: 2 days   Stark Klein 03/17/2020, 8:13 AM   I saw and evaluated the patient. I agree with the findings and the plan of care as documented in the resident's note. 1 L NS given as Hgb went up and likely hemoconcentration. Repeat fasting glucose in AM with CMP.   Barrington Ellison, MD [facility] Family Medicine Fellow, [place] for [employer], [place]

## 2020-03-18 LAB — COMPREHENSIVE METABOLIC PANEL
ALT: 18 U/L (ref 0–44)
AST: 40 U/L (ref 15–41)
Albumin: 2.2 g/dL — ABNORMAL LOW (ref 3.5–5.0)
Alkaline Phosphatase: 82 U/L (ref 38–126)
Anion gap: 9 (ref 5–15)
BUN: 13 mg/dL (ref 6–20)
CO2: 18 mmol/L — ABNORMAL LOW (ref 22–32)
Calcium: 7.6 mg/dL — ABNORMAL LOW (ref 8.9–10.3)
Chloride: 107 mmol/L (ref 98–111)
Creatinine, Ser: 1.31 mg/dL — ABNORMAL HIGH (ref 0.44–1.00)
GFR calc Af Amer: >60 mL/min (ref >60)
GFR calc non Af Amer: 54 mL/min — ABNORMAL LOW (ref >60)
Glucose, Bld: 88 mg/dL (ref 70–99)
Potassium: 4 mmol/L (ref 3.5–5.1)
Sodium: 134 mmol/L — ABNORMAL LOW (ref 135–145)
Total Bilirubin: 0.3 mg/dL (ref 0.3–1.2)
Total Protein: 4.9 g/dL — ABNORMAL LOW (ref 6.5–8.1)

## 2020-03-18 LAB — CBC
HCT: 32.5 % — ABNORMAL LOW (ref 36.0–46.0)
Hemoglobin: 11.2 g/dL — ABNORMAL LOW (ref 12.0–15.0)
MCH: 29.9 pg (ref 26.0–34.0)
MCHC: 34.5 g/dL (ref 30.0–36.0)
MCV: 86.9 fL (ref 80.0–100.0)
Platelets: 183 10*3/uL (ref 150–400)
RBC: 3.74 MIL/uL — ABNORMAL LOW (ref 3.87–5.11)
RDW: 14.1 % (ref 11.5–15.5)
WBC: 23.8 10*3/uL — ABNORMAL HIGH (ref 4.0–10.5)
nRBC: 0 % (ref 0.0–0.2)

## 2020-03-18 MED ORDER — HYDROCODONE-ACETAMINOPHEN 5-325 MG PO TABS: 1.0000 | ORAL_TABLET | ORAL | 0 refills | Status: AC | PRN

## 2020-03-18 NOTE — Lactation Note (Signed)
This note was copied from a baby's chart. Lactation Consultation Note  Patient Name: Judy James [id number] Date: 03/18/2020 Reason for consult: Follow-up assessment;Term Female baby is 37hrs old, wt loss 6%. Mom sitting in bed, baby asleep and swaddled in bassinet, mom reports just finished breastfeeding, states feedings are going well but notes discomfort with initial latch at times or if baby falls asleep at breast and resumes nursing. Mom reports h/o breastfeding first child 13years ago. Mom reports plans to offer breastmilk only to baby, noted formula sitting in room, mom denies use or request, states this was left in the room by a nurse. On assessment nipples erect bilat with stimulation, nipples without damage, mom demonstrated hand expression and reports comfort, mom nor this LC expressed colostrum on contact. Discussed cue based feedings, expect 10-12 feedings in 24hrs, wake baby if last feeding >3hrs during day or >4hrs over night, signs of adequate feedings vs poor feedings, monitor wet/ stool diapers, cluster feedings, skin to skin, engorgement and how to manage, [facility] brochure with numbers for [facility] telephone and outpatient support, support groups, and community resources. Encouraged mom to call [place] if with questions or desires assistance prior to discharge. Mom voiced understanding and with no further concerns. Left the room with mom sitting in bed, baby asleep in bassinet, removed formula from room per mom's request. BGilliam, RN, [place]  Maternal Data Formula Feeding for Exclusion: No  Feeding Feeding Type: Breast Fed  LATCH Score This LC did not observe latch  Interventions    Lactation Tools Discussed/Used WIC Program: Yes   Consult Status Consult Status: Complete Date: 03/18/20    Bernita Buffy 03/18/2020, 9:50 AM

## 2020-03-18 NOTE — Discharge Instructions (Signed)
Cesarean Delivery Cesarean birth, or cesarean delivery, is the surgical delivery of a baby through an incision in the abdomen and the uterus. This may be referred to as a C-section. This procedure may be scheduled ahead of time, or it may be done in an emergency situation. Tell a health care provider about:  Any allergies you have.  All medicines you are taking, including vitamins, herbs, eye drops, creams, and over-the-counter medicines.  Any problems you or family members have had with anesthetic medicines.  Any blood disorders you have.  Any surgeries you have had.  Any medical conditions you have.  Whether you or any members of your family have a history of deep vein thrombosis (DVT) or pulmonary embolism (PE). What are the risks? Generally, this is a safe procedure. However, problems may occur, including:  Infection.  Bleeding.  Allergic reactions to medicines.  Damage to other structures or organs.  Blood clots.  Injury to your baby. What happens before the procedure? General instructions  Follow instructions from your health care provider about eating or drinking restrictions.  If you know that you are going to have a cesarean delivery, do not shave your pubic area. Shaving before the procedure may increase your risk of infection.  Plan to have someone take you home from the hospital.  Ask your health care provider what steps will be taken to prevent infection. These may include: ? Removing hair at the surgery site. ? Washing skin with a germ-killing soap. ? Taking antibiotic medicine.  Depending on the reason for your cesarean delivery, you may have a physical exam or additional testing, such as an ultrasound.  You may have your blood or urine tested. Questions for your health care provider  Ask your health care provider about: ? Changing or stopping your regular medicines. This is especially important if you are taking diabetes medicines or blood  thinners. ? Your pain management plan. This is especially important if you plan to breastfeed your baby. ? How long you will be in the hospital after the procedure. ? Any concerns you may have about receiving blood products, if you need them during the procedure. ? Cord blood banking, if you plan to collect your baby's umbilical cord blood.  You may also want to ask your health care provider: ? Whether you will be able to hold or breastfeed your baby while you are still in the operating room. ? Whether your baby can stay with you immediately after the procedure and during your recovery. ? Whether a family member or a person of your choice can go with you into the operating room and stay with you during the procedure, immediately after the procedure, and during your recovery. What happens during the procedure?   An IV will be inserted into one of your veins.  Fluid and medicines, such as antibiotics, will be given before the surgery.  Fetal monitors will be placed on your abdomen to check your baby's heart rate.  You may be given a special warming gown to wear to keep your temperature stable.  A catheter may be inserted into your bladder through your urethra. This drains your urine during the procedure.  You may be given one or more of the following: ? A medicine to numb the area (local anesthetic). ? A medicine to make you fall asleep (general anesthetic). ? A medicine (regional anesthetic) that is injected into your back or through a small thin tube placed in your back (spinal anesthetic or epidural anesthetic).   This numbs everything below the injection site and allows you to stay awake during your procedure. If this makes you feel nauseous, tell your health care provider. Medicines will be available to help reduce any nausea you may feel.  An incision will be made in your abdomen, and then in your uterus.  If you are awake during your procedure, you may feel tugging and pulling in  your abdomen, but you should not feel pain. If you feel pain, tell your health care provider immediately.  Your baby will be removed from your uterus. You may feel more pressure or pushing while this happens.  Immediately after birth, your baby will be dried and kept warm. You may be able to hold and breastfeed your baby.  The umbilical cord may be clamped and cut during this time. This usually occurs after waiting a period of 1-2 minutes after delivery.  Your placenta will be removed from your uterus.  Your incisions will be closed with stitches (sutures). Staples, skin glue, or adhesive strips may also be applied to the incision in your abdomen.  Bandages (dressings) may be placed over the incision in your abdomen. The procedure may vary among health care providers and hospitals. What happens after the procedure?  Your blood pressure, heart rate, breathing rate, and blood oxygen level will be monitored until you are discharged from the hospital.  You may continue to receive fluids and medicines through an IV.  You will have some pain. Medicines will be available to help control your pain.  To help prevent blood clots: ? You may be given medicines. ? You may have to wear compression stockings or devices. ? You will be encouraged to walk around when you are able.  Hospital staff will encourage and support bonding with your baby. Your hospital may have you and your baby to stay in the same room (rooming in) during your hospital stay to encourage successful bonding and breastfeeding.  You may be encouraged to cough and breathe deeply often. This helps to prevent lung problems.  If you have a catheter draining your urine, it will be removed as soon as possible after your procedure. Summary  Cesarean birth, or cesarean delivery, is the surgical delivery of a baby through an incision in the abdomen and the uterus.  Follow instructions from your health care provider about eating or  drinking restrictions before the procedure.  You will have some pain after the procedure. Medicines will be available to help control your pain.  Hospital staff will encourage and support bonding with your baby after the procedure. Your hospital may have you and your baby to stay in the same room (rooming in) during your hospital stay to encourage successful bonding and breastfeeding. This information is not intended to replace advice given to you by your health care provider. Make sure you discuss any questions you have with your health care provider. Document Revised: 04/15/2018 Document Reviewed: 04/15/2018 Elsevier Patient Education  2020 Elsevier Inc.  

## 2020-03-23 LAB — SURGICAL PATHOLOGY

## 2020-03-30 ENCOUNTER — Ambulatory Visit (INDEPENDENT_AMBULATORY_CARE_PROVIDER_SITE_OTHER): Payer: BC Managed Care – PPO

## 2020-03-30 ENCOUNTER — Other Ambulatory Visit: Payer: Self-pay

## 2020-03-30 DIAGNOSIS — Z5189 Encounter for other specified aftercare: Secondary | ICD-10-CM

## 2020-03-30 NOTE — Progress Notes (Signed)
I have reviewed this chart and agree with the RN/CMA assessment and management.    K. Meryl Lamone Ferrelli, M.D. Attending [facility] (Faculty Practice)   

## 2020-03-30 NOTE — Progress Notes (Signed)
Judy James is here for a wound check.  Pt delivered baby on 03/15/20 via c-section. Pt denied drainage but has a small amount of pain with certain movement. Wound appeared closed with no drainage noted. Pt advised to return for PP visit. -EH/RMA

## 2020-04-28 ENCOUNTER — Ambulatory Visit (INDEPENDENT_AMBULATORY_CARE_PROVIDER_SITE_OTHER): Payer: BC Managed Care – PPO | Admitting: Obstetrics and Gynecology

## 2020-04-28 ENCOUNTER — Other Ambulatory Visit: Payer: Self-pay

## 2020-04-28 ENCOUNTER — Encounter: Payer: Self-pay | Admitting: Obstetrics and Gynecology

## 2020-04-28 ENCOUNTER — Other Ambulatory Visit: Payer: BC Managed Care – PPO

## 2020-04-28 VITALS — BP 119/81 | HR 75 | Wt 167.0 lb

## 2020-04-28 DIAGNOSIS — Z8632 Personal history of gestational diabetes: Secondary | ICD-10-CM

## 2020-04-28 DIAGNOSIS — Z0000 Encounter for general adult medical examination without abnormal findings: Secondary | ICD-10-CM

## 2020-04-28 DIAGNOSIS — Z3480 Encounter for supervision of other normal pregnancy, unspecified trimester: Secondary | ICD-10-CM

## 2020-04-28 DIAGNOSIS — O24419 Gestational diabetes mellitus in pregnancy, unspecified control: Secondary | ICD-10-CM

## 2020-04-28 NOTE — Progress Notes (Signed)
Obstetrics/Postpartum Visit  Appointment Date: 04/28/2020  OBGYN Clinic: [facility]  Primary Care Provider: No primary care provider on file.  Chief Complaint:  Chief Complaint  Patient presents with  . Postpartum Follow-up    History of Present Illness: Judy James is a 34 y.o. African-American [id number] (Patient's last menstrual period was 06/16/2019 (exact date).), seen for the above chief complaint. Her past medical history is significant for gDMA1.   She is s/p urgent RCS on 03/16/20 at 39 weeks; she was discharged to home on POD#2. Pregnancy complicated by gdMA1.  Complains of nothing. She is feeling well.   Vaginal bleeding or discharge: No  Breast or formula feeding: breast and bottle Intercourse: Yes  Contraception: declines PP depression s/s: No  Any bowel or bladder issues: No  Pap smear: no abnormalities (date: 11/2017)  Review of Systems: Positive for n/a.   Her 12 point review of systems is negative or as noted in the History of Present Illness.  Patient Active Problem List   Diagnosis Date Noted  . Chorioamnionitis 03/16/2020  . Cesarean delivery delivered   . Gestational diabetes mellitus (GDM) affecting pregnancy, antepartum 12/29/2019  . Obesity affecting pregnancy 10/31/2019  . Supervision of other normal pregnancy, antepartum 09/05/2019  . Ovarian dermoid cyst complicating pregnancy, antepartum, unspecified trimester 08/13/2019  . Migraines 08/11/2014  . History of cesarean delivery, currently pregnant 01/17/2007    Medications Juno S. Castrejon had no medications administered during this visit. Current Outpatient Medications  Medication Sig Dispense Refill  . Prenatal Vit-Fe Fumarate-FA (MULTIVITAMIN-PRENATAL) 27-0.8 MG TABS tablet Take 1 tablet by mouth daily at 12 noon.    . docusate sodium (COLACE) 100 MG capsule Take 1 capsule (100 mg total) by mouth 2 (two) times daily as needed. (Patient not taking: Reported on 04/28/2020) 30 capsule 2  . Elastic  Bandages & Supports (COMFORT FIT MATERNITY SUPP LG) MISC 1 Units by Does not apply route daily as needed. (Patient not taking: Reported on 04/28/2020) 1 each 0  . ferrous sulfate 325 (65 FE) MG tablet Take 1 tablet (325 mg total) by mouth 2 (two) times daily with a meal. 60 tablet 0  . ondansetron (ZOFRAN ODT) 4 MG disintegrating tablet Take 1 tablet (4 mg total) by mouth every 8 (eight) hours as needed for nausea or vomiting. (Patient not taking: Reported on 04/28/2020) 15 tablet 0  . simethicone (GAS-X) 80 MG chewable tablet Chew 1 tablet (80 mg total) by mouth every 6 (six) hours as needed for flatulence. (Patient not taking: Reported on 04/28/2020) 30 tablet 0   No current facility-administered medications for this visit.    Allergies Penicillin g  Physical Exam:  BP 119/81   Pulse 75   Wt 167 lb (75.8 kg)   LMP 06/16/2019 (Exact Date)   BMI 31.55 kg/m  Body mass index is 31.55 kg/m. General appearance: Well nourished, well developed female in no acute distress.  Cardiovascular: regular rate and rhythm Respiratory:   Normal respiratory effort Abdomen:  no masses, hernias; diffusely non tender to palpation, non distended, well healed pfannenstiel incision Breasts: deferred Neuro/Psych:  Normal mood and affect.  Skin:  Warm and dry.    PP Depression Screening:    Edinburgh Postnatal Depression Scale - 04/28/20 0923      Edinburgh Postnatal Depression Scale:  In the Past 7 Days   I have been able to laugh and see the funny side of things. 0    I have looked forward with enjoyment to  things. 0    I have blamed myself unnecessarily when things went wrong. 0    I have been anxious or worried for no good reason. 0    I have felt scared or panicky for no good reason. 0    Things have been getting on top of me. 0    I have been so unhappy that I have had difficulty sleeping. 0    I have felt sad or miserable. 0    I have been so unhappy that I have been crying. 0    The thought of  harming myself has occurred to me. 0    Edinburgh Postnatal Depression Scale Total 0           Assessment: Patient is a 34 y.o. [id number] who is 6 weeks post partum from a RCS. She is doing well.   Plan:   1. Supervision of other normal pregnancy, antepartum  2. Gestational diabetes mellitus (GDM) affecting pregnancy, antepartum - Glucose tolerance, 2 hours   Essential components of care per ACOG recommendations:  1.  Mood and well being: Patient with negative depression screening today. Reviewed local resources for support.  - Patient does not use tobacco. - hx of drug use? No    2. Infant care and feeding:  -Patient currently breastmilk feeding? Yes  If needed, patient was provided letter for work to allow for every 2-3 hr pumping breaks, and to be granted a private location to express breastmilk and refrigerated area to store breastmilk. Reviewed importance of draining breast regularly to support lactation. -Social determinants of health (SDOH) reviewed in EPIC. No concerns  3. Sexuality, contraception and birth spacing - Patient does not want a pregnancy in the next year.  .  - Reviewed forms of contraception in tiered fashion. Patient desired no method today.    4. Sleep and fatigue -Encouraged family/partner/community support of 4 hrs of uninterrupted sleep to help with mood and fatigue  5. Physical Recovery  - Discussed patients delivery and complications - Patient had a RCS - Patient has urinary incontinence? No - Patient is safe to resume physical and sexual activity  6.  Health Maintenance - Last pap smear done 11/2017 and was normal with negative HPV.  RTC 6 months for pap   K. Arvilla Meres, M.D. Attending [facility] for [employer])

## 2020-04-29 LAB — GLUCOSE TOLERANCE, 2 HOURS
Glucose, 2 hour: 94 mg/dL (ref 65–139)
Glucose, GTT - Fasting: 87 mg/dL (ref 65–99)

## 2020-10-17 IMAGING — US US OB TRANSVAGINAL
1 series · 13 of 28 positions shown · non-contrast
Comparison: 11/13/2018, CT 12/14/2011

CLINICAL DATA: Miscarriage 11/15/2018, now with heavy bleeding

EXAM:
TRANSVAGINAL OB ULTRASOUND
TECHNIQUE: Transvaginal ultrasound was performed for complete evaluation of the
gestation as well as the maternal uterus, adnexal regions, and
pelvic cul-de-sac.

[Series 1: us ob transvaginal · 53 acquisitions, 13 frames shown]
[im 2/53]
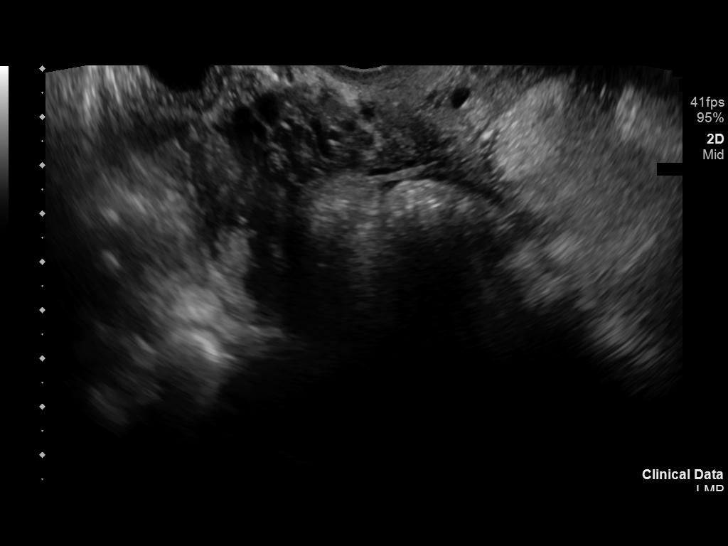
[im 6/53]
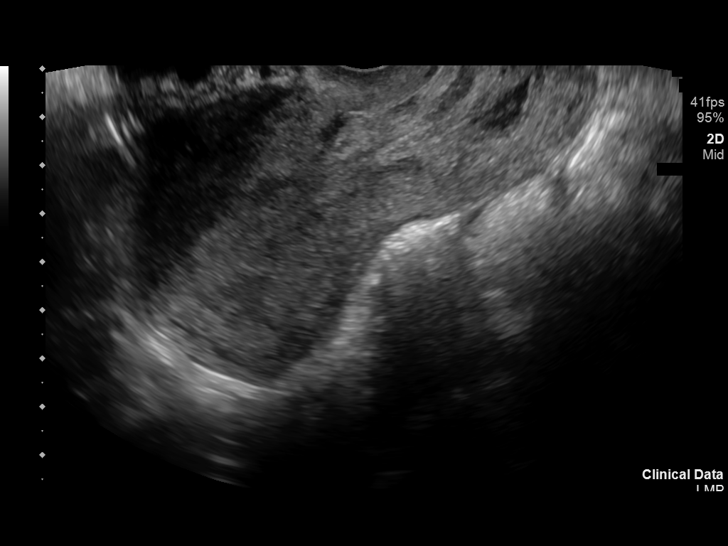
[im 10/53]
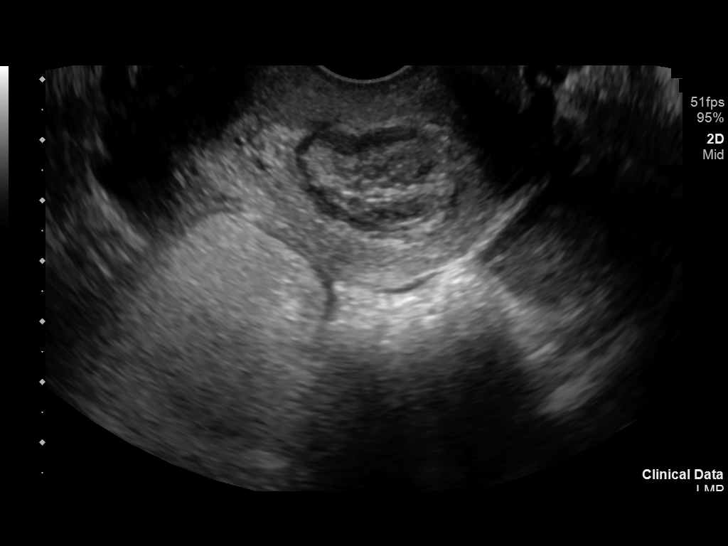
[im 14/53]
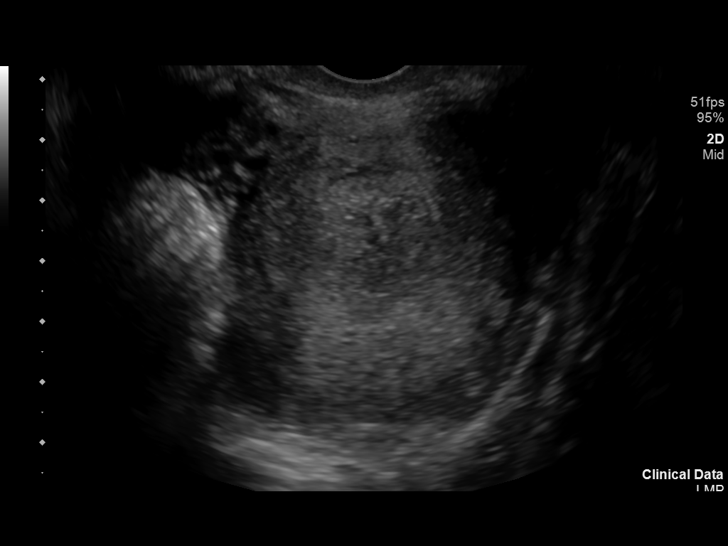
[im 18/53]
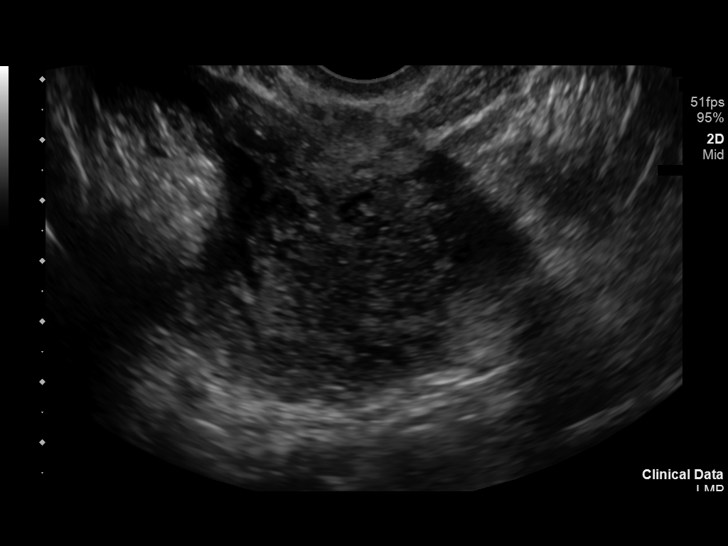
[im 22/53]
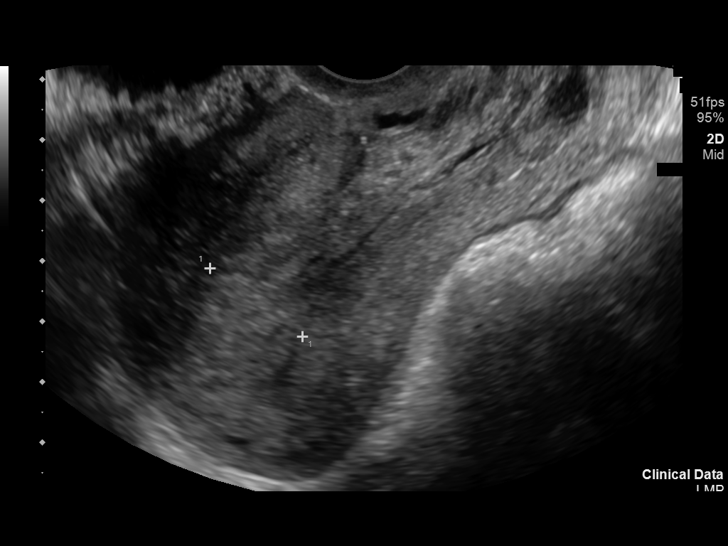
[im 27/53]
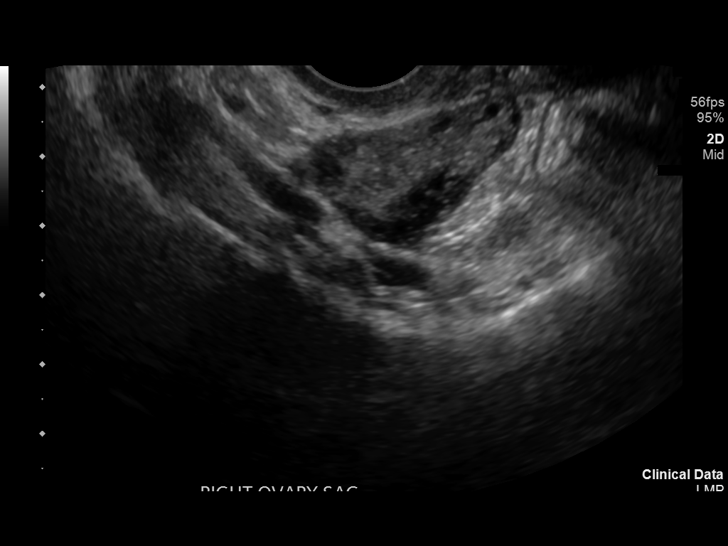
[im 31/53]
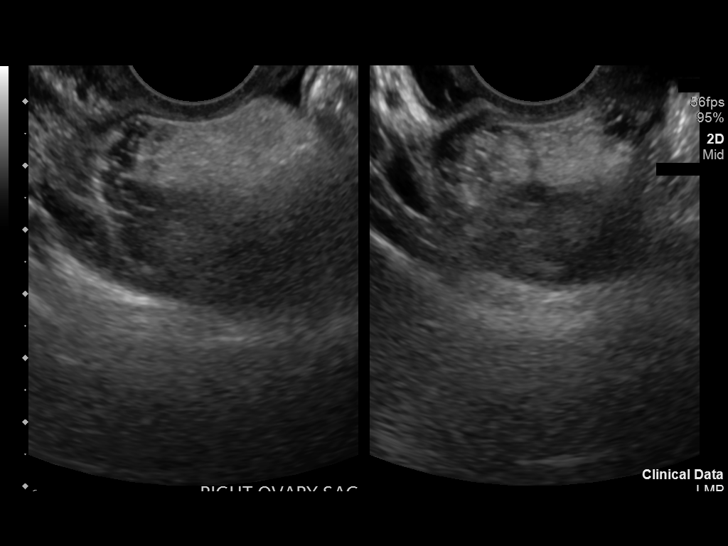
[im 35/53]
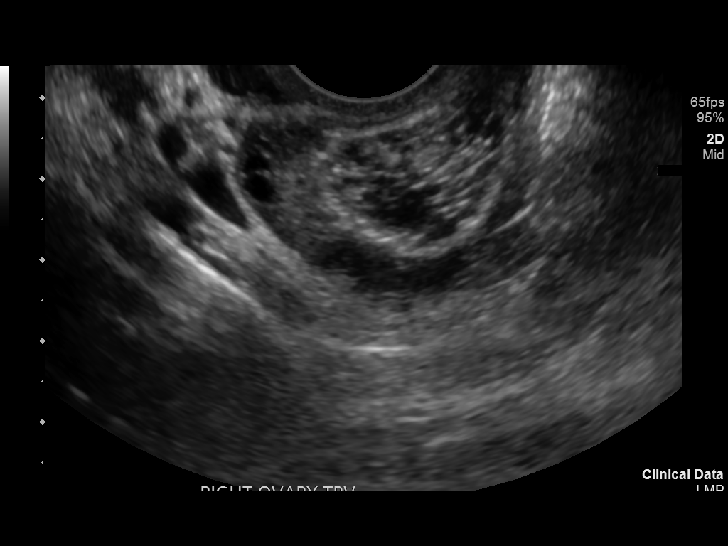
[im 39/53]
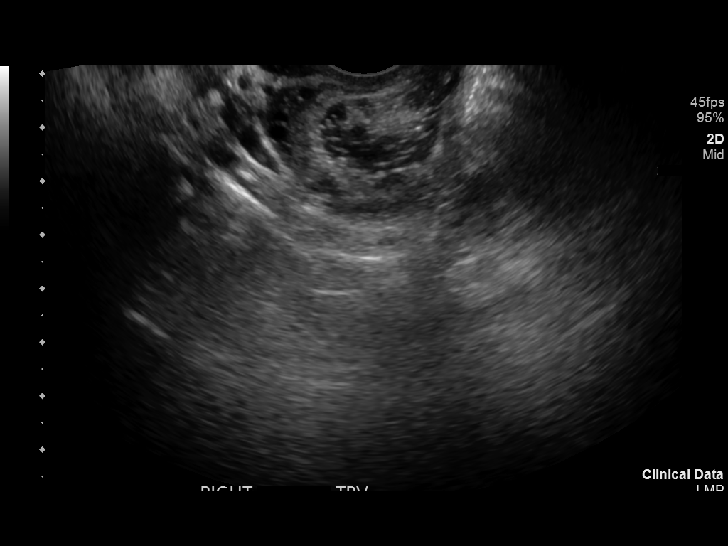
[im 43/53]
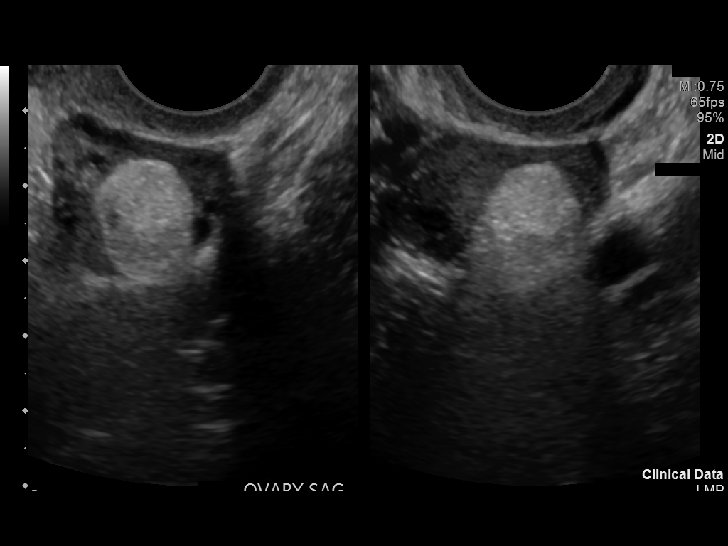
[im 47/53]
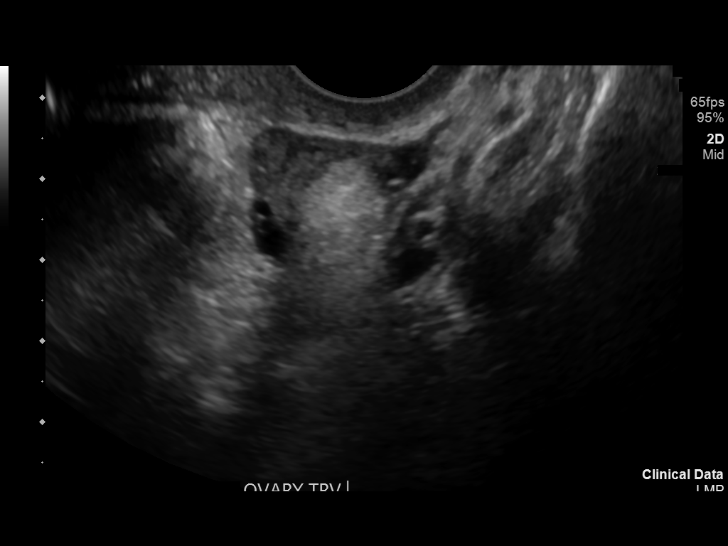
[im 51/53]
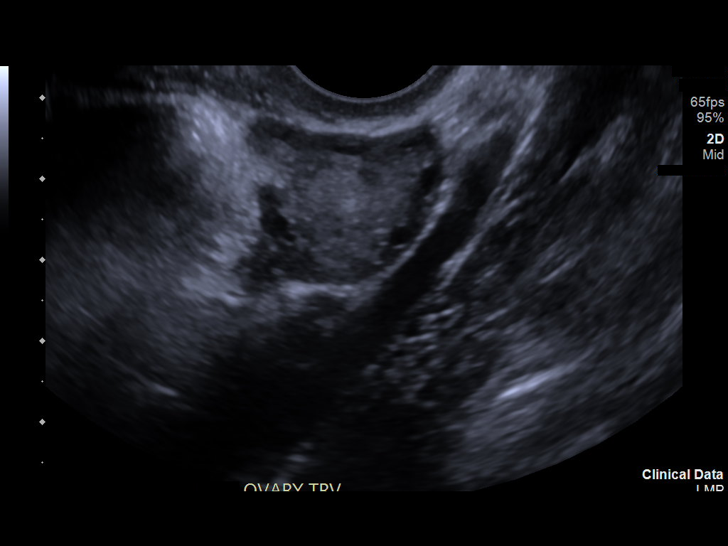

[13 of 28 positions shown; findings below may reference images not displayed]

FINDINGS: Intrauterine gestational sac: None

Yolk sac:  Visualized.

Embryo:  Visualized.

Maternal uterus/adnexae: Heterogenous thickening of the endometrium,
measuring up to 1.9 cm with vascularity. Heterogenous material
within the lower uterine segment and cervix, avascular, possibly
blood clot. Right ovary measures 3 x 1.9 by 3.8 cm with a volume of
11.4 mL. Echogenic mass measuring 3.9 x 2.9 x 3.8 cm. The left ovary
measures 2.5 x 2.6 by 2.4 cm with a volume of 8.2 mL. Echogenic mass
within the left ovary measuring 1.3 x 1.8 x 1.4 cm. Trace free
fluid.
IMPRESSION: 1. Heterogenous thickening of the endometrium up to 1.9 cm with
vascularity. Findings concerning for retained products of conception
in the correct clinical setting. Additional heterogenous hypoechoic
avascular material within the lower uterine segment and cervix,
possible blood clot.
2. 3.9 cm complex right ovarian mass and 1.8 cm echogenic left
ovarian mass, possible dermoids. Consider surgical consultation.
Further evaluation with nonemergent pelvic MRI could also be
considered.

## 2021-06-03 ENCOUNTER — Other Ambulatory Visit (HOSPITAL_COMMUNITY)
Admission: RE | Admit: 2021-06-03 | Discharge: 2021-06-03 | Disposition: A | Discharge status: Home / Self Care | Payer: Medicaid Other | Source: Ambulatory Visit | Attending: Obstetrics and Gynecology | Admitting: Obstetrics and Gynecology

## 2021-06-03 ENCOUNTER — Ambulatory Visit (INDEPENDENT_AMBULATORY_CARE_PROVIDER_SITE_OTHER): Payer: Medicaid Other | Admitting: Obstetrics and Gynecology

## 2021-06-03 ENCOUNTER — Other Ambulatory Visit: Payer: Self-pay

## 2021-06-03 VITALS — BP 117/80 | HR 66 | Ht 60.0 in | Wt 186.6 lb

## 2021-06-03 DIAGNOSIS — Z01419 Encounter for gynecological examination (general) (routine) without abnormal findings: Secondary | ICD-10-CM | POA: Diagnosis not present

## 2021-06-03 DIAGNOSIS — D369 Benign neoplasm, unspecified site: Secondary | ICD-10-CM | POA: Diagnosis not present

## 2021-06-03 NOTE — Progress Notes (Signed)
GYNECOLOGY ANNUAL PREVENTATIVE CARE ENCOUNTER NOTE  History:     Judy ULINSKI is a 35 y.o. G54P2012 female here for a routine annual gynecologic exam.  Current complaints: none.   Denies abnormal vaginal bleeding, discharge, pelvic pain, problems with intercourse or other gynecologic concerns. HX of dermoid cyst.  Last [place] was 5/7. She has occasional pain in her pelvis. The pain comes and goes on its own.    Gynecologic History No LMP recorded. Contraception: none Last Pap: 2019.  Result was normal with negative HPV Last Mammogram: NA  Obstetric History OB History  Gravida Para Term Preterm AB Living  '3 2 2   1 2  '$ SAB IAB Ectopic Multiple Live Births  1     0 2    # Outcome Date GA Lbr Len/2nd Weight Sex Delivery Anes PTL Lv  3 Term 03/16/20 32w1d17:02 / 04:19 7 lb 15 oz (3.6 kg) M CS-LVertical EPI  LIV  2 Term 01/17/07 443w0d8 lb (3.629 kg) F CS-For   LIV     Complications: Fetal Intolerance  1 SAB             Past Medical History:  Diagnosis Date   Arthritis    knee   Knee pain    Migraines     Past Surgical History:  Procedure Laterality Date   CESAREAN SECTION  2008   CESAREAN SECTION N/A 03/16/2020   Procedure: CESAREAN SECTION;  Surgeon: StTruett MainlandDO;  [facility];  Service: Obstetrics;  Laterality: N/A;   DILATION AND EVACUATION N/A 11/26/2018   Procedure: DILATATION AND EVACUATION;  Surgeon: PiAletha HalimMD;  [facility];  Service: Gynecology;  Laterality: N/A;   WISDOM TOOTH EXTRACTION      Current Outpatient Medications on File Prior to Visit  Medication Sig Dispense Refill   docusate sodium (COLACE) 100 MG capsule Take 1 capsule (100 mg total) by mouth 2 (two) times daily as needed. (Patient not taking: No sig reported) 30 capsule 2   Elastic Bandages & Supports (COMFORT FIT MATERNITY SUPP LG) MISC 1 Units by Does not apply route daily as needed. (Patient not taking: No sig reported) 1 each 0   ferrous sulfate 325 (65 FE) MG  tablet Take 1 tablet (325 mg total) by mouth 2 (two) times daily with a meal. 60 tablet 0   ondansetron (ZOFRAN ODT) 4 MG disintegrating tablet Take 1 tablet (4 mg total) by mouth every 8 (eight) hours as needed for nausea or vomiting. (Patient not taking: No sig reported) 15 tablet 0   Prenatal Vit-Fe Fumarate-FA (MULTIVITAMIN-PRENATAL) 27-0.8 MG TABS tablet Take 1 tablet by mouth daily at 12 noon. (Patient not taking: Reported on 06/03/2021)     simethicone (GAS-X) 80 MG chewable tablet Chew 1 tablet (80 mg total) by mouth every 6 (six) hours as needed for flatulence. (Patient not taking: No sig reported) 30 tablet 0   No current facility-administered medications on file prior to visit.    Allergies  Allergen Reactions   Penicillin G Hives    Social History:  reports that she has never smoked. She has never used smokeless tobacco. She reports that she does not currently use alcohol. She reports that she does not use drugs.  Family History  Problem Relation Age of Onset   Diabetes Mother    Diabetes Maternal Aunt    Hearing loss Maternal Aunt    Heart disease Maternal Aunt  Hypertension Maternal Aunt    Stroke Maternal Aunt    Alcohol abuse Maternal Uncle    Diabetes Maternal Uncle    Drug abuse Maternal Uncle    Hyperlipidemia Maternal Uncle    Hypertension Maternal Uncle    Heart disease Maternal Uncle    Cancer Maternal Grandmother    Diabetes Maternal Grandmother    COPD Maternal Grandfather     The following portions of the patient's history were reviewed and updated as appropriate: allergies, current medications, past family history, past medical history, past social history, past surgical history and problem list.  Review of Systems Pertinent items noted in HPI and remainder of comprehensive ROS otherwise negative.  Physical Exam:  BP 117/80   Pulse 66   Ht 5' (1.524 m)   Wt 186 lb 9.6 oz (84.6 kg)   BMI 36.44 kg/m  CONSTITUTIONAL: Well-developed, well-nourished  female in no acute distress.  HENT:  Normocephalic, atraumatic, External right and left ear normal.  EYES: Conjunctivae and EOM are normal. Pupils are equal, round, and reactive to light. No scleral icterus.  NECK: Normal range of motion, supple, no masses.  Normal thyroid.  SKIN: Skin is warm and dry. No rash noted. Not diaphoretic. No erythema. No pallor. MUSCULOSKELETAL: Normal range of motion. No tenderness.  No cyanosis, clubbing, or edema. NEUROLOGIC: Alert and oriented to person, place, and time. Normal reflexes, muscle tone coordination.  PSYCHIATRIC: Normal mood and affect. Normal behavior. Normal judgment and thought content. CARDIOVASCULAR: Normal heart rate noted, regular rhythm RESPIRATORY: Clear to auscultation bilaterally. Effort and breath sounds normal, no problems with respiration noted. BREASTS: Symmetric in size. No masses, tenderness, skin changes, nipple drainage, or lymphadenopathy bilaterally. Performed in the presence of a chaperone. ABDOMEN: Soft, no distention noted.  No tenderness, rebound or guarding.  PELVIC: Normal appearing external genitalia and urethral meatus; normal appearing vaginal mucosa and cervix.  No abnormal vaginal discharge noted.  Pap smear obtained.  Normal uterine size, no other palpable masses, no uterine or adnexal tenderness.  Performed in the presence of a chaperone.   Assessment and Plan:   1. Women's annual routine gynecological examination  - Cytology - PAP - Cervicovaginal ancillary only( [place]) - US Abdomen Complete; Future  2. Dermoid cyst  - US Abdomen Complete; Future  - She pain at this time.   Will follow up results of pap smear and manage accordingly. Routine preventative health maintenance measures emphasized. Please refer to After Visit Summary for other counseling recommendations.   Zackory Pudlo, Artist Pais, [place] [facility] [employer], [place]

## 2021-06-03 NOTE — Progress Notes (Signed)
Annual, Last PAP 03/16/20 No complaints Interested in STI swab not labs

## 2021-06-06 LAB — CERVICOVAGINAL ANCILLARY ONLY
Bacterial Vaginitis (gardnerella): POSITIVE — AB
Candida Glabrata: NEGATIVE
Candida Vaginitis: NEGATIVE
Chlamydia: NEGATIVE
Comment: NEGATIVE
Comment: NEGATIVE
Comment: NEGATIVE
Comment: NEGATIVE
Comment: NEGATIVE
Comment: NORMAL
Neisseria Gonorrhea: NEGATIVE
Trichomonas: NEGATIVE

## 2021-06-08 LAB — CYTOLOGY - PAP
Adequacy: ABSENT
Comment: NEGATIVE
Diagnosis: NEGATIVE
High risk HPV: NEGATIVE

## 2021-06-10 ENCOUNTER — Other Ambulatory Visit: Payer: Self-pay

## 2021-06-10 ENCOUNTER — Ambulatory Visit (HOSPITAL_COMMUNITY)
Admission: RE | Admit: 2021-06-10 | Discharge: 2021-06-10 | Disposition: A | Discharge status: Home / Self Care | Payer: Medicaid Other | Source: Ambulatory Visit | Attending: Obstetrics and Gynecology | Admitting: Obstetrics and Gynecology

## 2021-06-10 ENCOUNTER — Other Ambulatory Visit: Payer: Self-pay | Admitting: Obstetrics and Gynecology

## 2021-06-10 ENCOUNTER — Encounter (HOSPITAL_COMMUNITY): Payer: Self-pay

## 2021-06-10 ENCOUNTER — Telehealth: Payer: Self-pay | Admitting: *Deleted

## 2021-06-10 ENCOUNTER — Ambulatory Visit (HOSPITAL_COMMUNITY): Payer: Medicaid Other

## 2021-06-10 DIAGNOSIS — D279 Benign neoplasm of unspecified ovary: Secondary | ICD-10-CM

## 2021-06-10 DIAGNOSIS — O3480 Maternal care for other abnormalities of pelvic organs, unspecified trimester: Secondary | ICD-10-CM

## 2021-06-10 DIAGNOSIS — D369 Benign neoplasm, unspecified site: Secondary | ICD-10-CM

## 2021-06-10 DIAGNOSIS — Z01419 Encounter for gynecological examination (general) (routine) without abnormal findings: Secondary | ICD-10-CM

## 2021-06-10 NOTE — Telephone Encounter (Signed)
Received a voice message from yesterday am stating " this is Programmer, multimedia from [facility] health scheduling. Calling about Judy James ordered for Judy abomen complete for 8/19 /22 am has associated diagnosis womens routing gyn exam and ovarian cysts. States Judy tech questioning should it be Pelvic complete with transvaginal , not abd. Please change orders and get signed if appropriate, may call if need to discuss.to [phone]. Per chart review is [place] patient , will route to that office. Madiha Bambrick,RN

## 2021-06-17 ENCOUNTER — Ambulatory Visit (HOSPITAL_COMMUNITY)
Admission: RE | Admit: 2021-06-17 | Discharge: 2021-06-17 | Disposition: A | Discharge status: Home / Self Care | Payer: Medicaid Other | Source: Ambulatory Visit | Attending: Obstetrics and Gynecology | Admitting: Obstetrics and Gynecology

## 2021-06-17 ENCOUNTER — Other Ambulatory Visit: Payer: Self-pay

## 2021-06-17 DIAGNOSIS — O3480 Maternal care for other abnormalities of pelvic organs, unspecified trimester: Secondary | ICD-10-CM | POA: Insufficient documentation

## 2021-06-17 DIAGNOSIS — D271 Benign neoplasm of left ovary: Secondary | ICD-10-CM | POA: Diagnosis not present

## 2021-06-17 DIAGNOSIS — D279 Benign neoplasm of unspecified ovary: Secondary | ICD-10-CM | POA: Insufficient documentation

## 2021-06-17 DIAGNOSIS — D270 Benign neoplasm of right ovary: Secondary | ICD-10-CM | POA: Diagnosis not present

## 2021-07-15 DIAGNOSIS — N62 Hypertrophy of breast: Secondary | ICD-10-CM | POA: Diagnosis not present

## 2021-07-18 DIAGNOSIS — J069 Acute upper respiratory infection, unspecified: Secondary | ICD-10-CM | POA: Diagnosis not present

## 2021-07-18 DIAGNOSIS — U071 COVID-19: Secondary | ICD-10-CM | POA: Diagnosis not present

## 2021-09-24 IMAGING — US US MFM OB DETAIL+14 WK
1 series · 13 of 28 positions shown · non-contrast
Comparison: none

[Series 1: us mfm ob detail+14 wk · 13 of 126 slices shown]
[im 5/126]
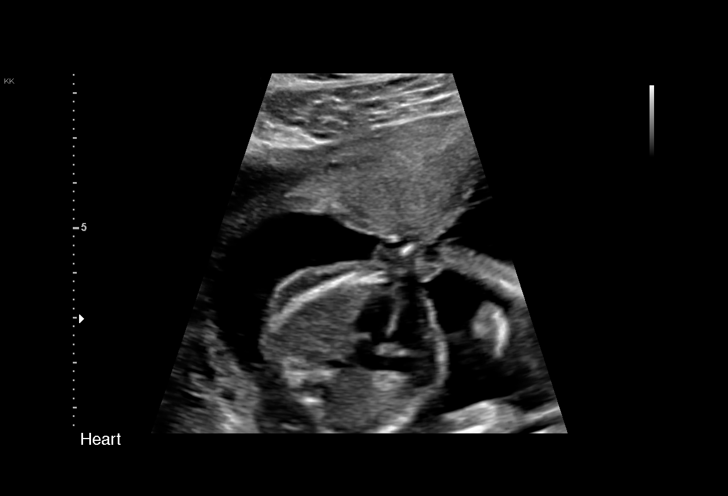
[im 14/126]
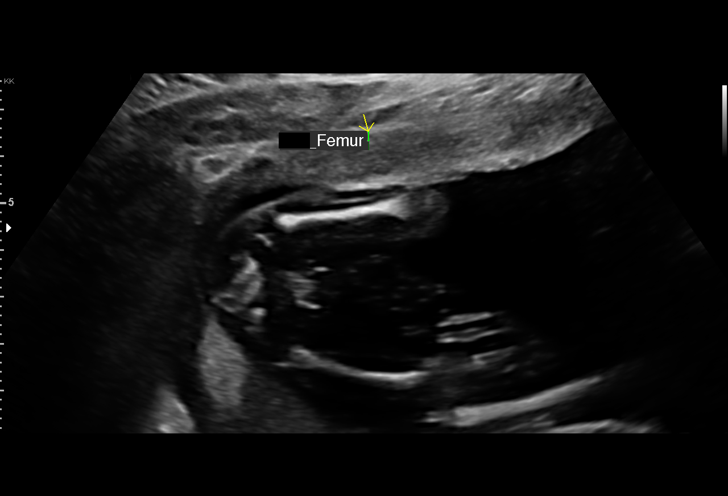
[im 24/126]
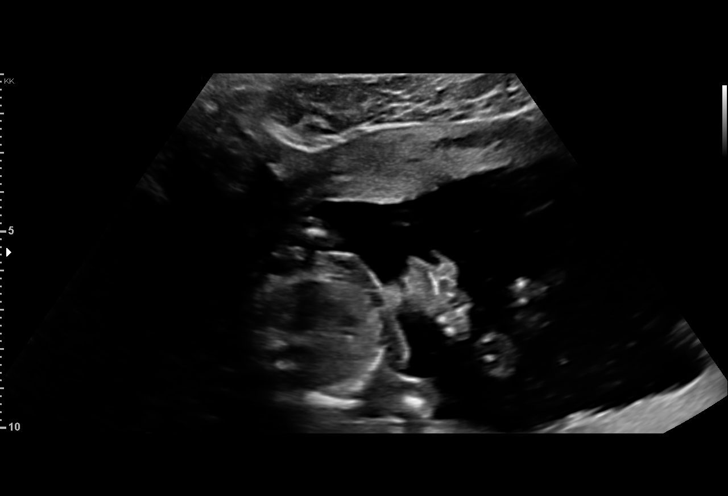
[im 33/126]
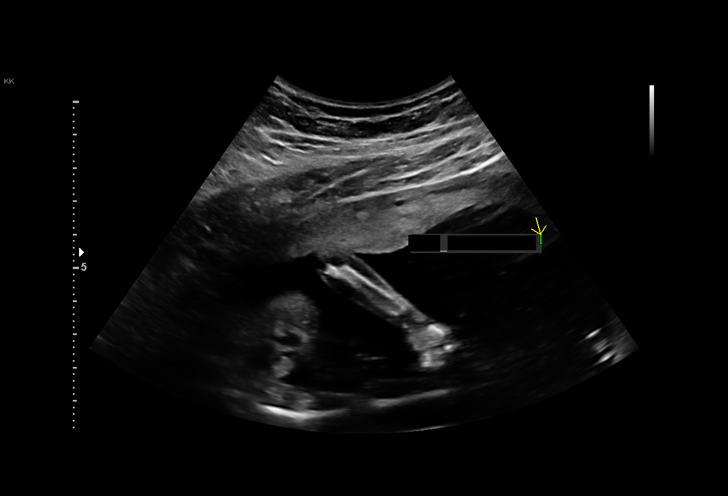
[im 42/126]
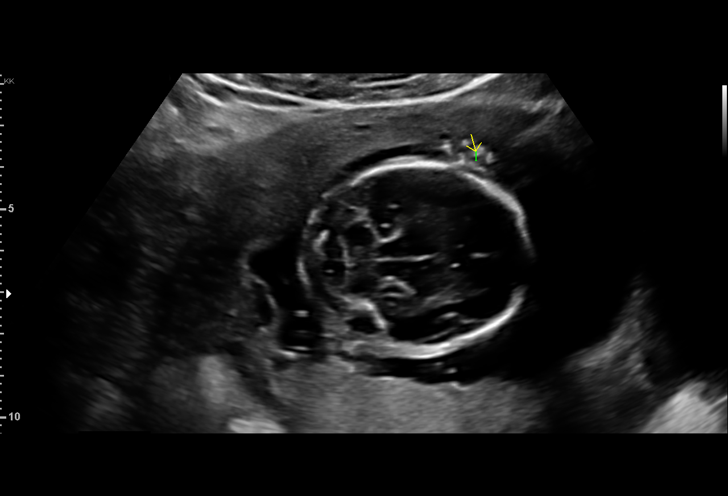
[im 51/126]
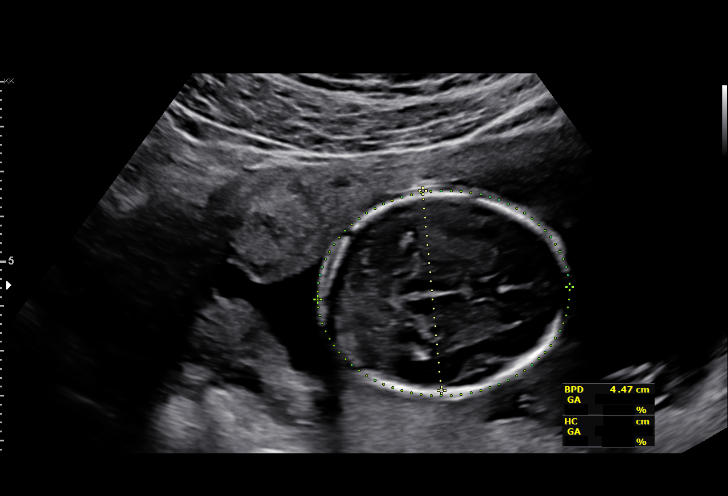
[im 65/126]
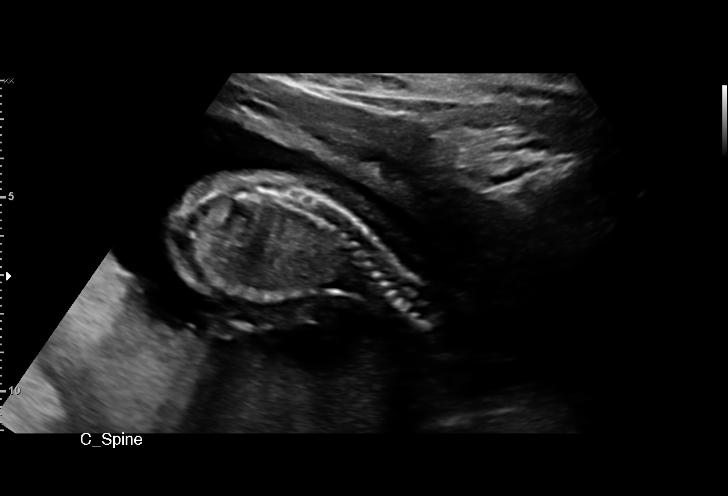
[im 75/126]
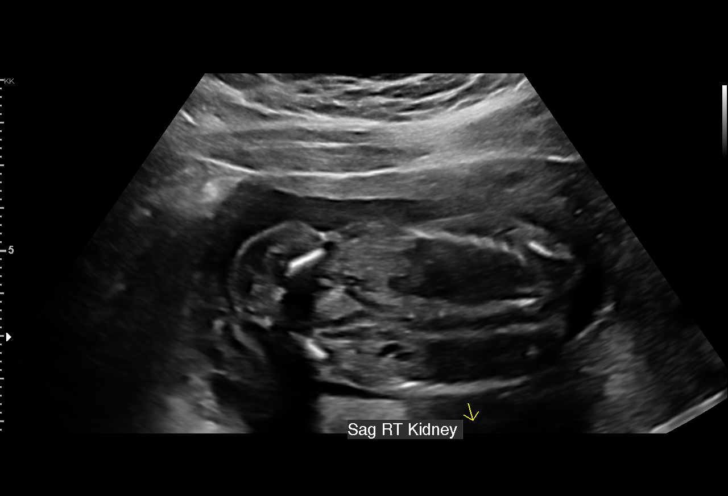
[im 84/126]
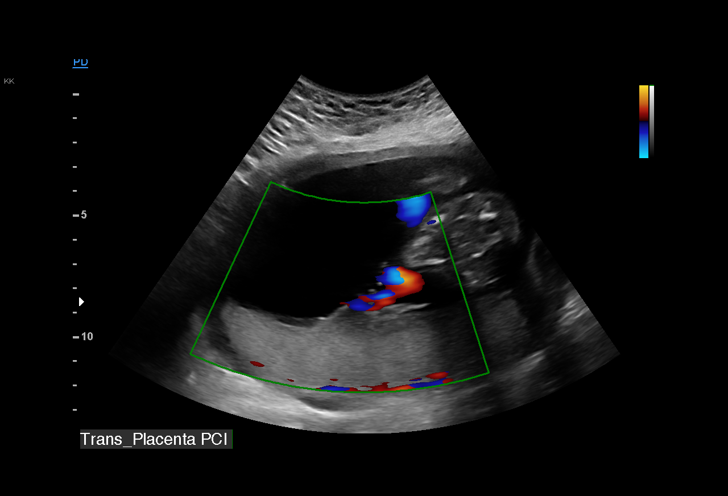
[im 93/126]
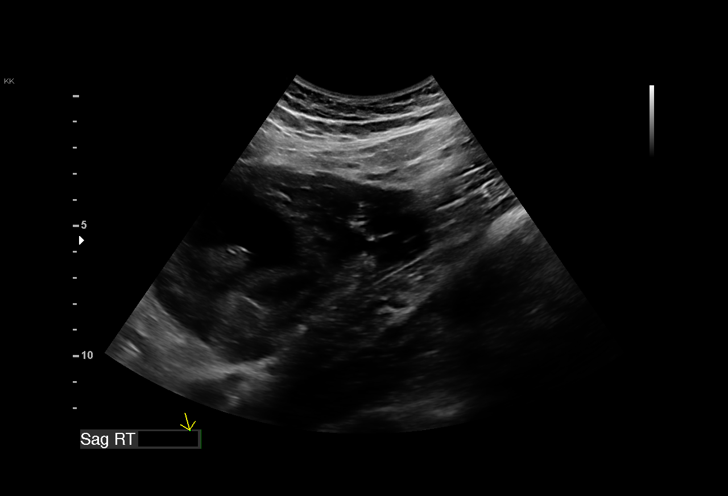
[im 102/126]
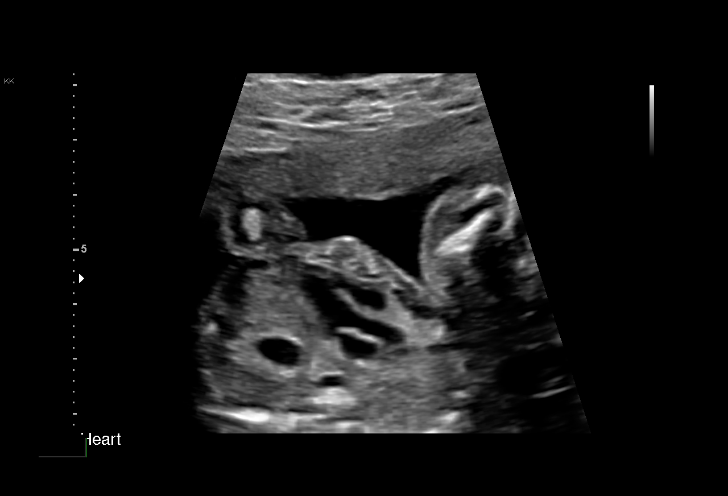
[im 112/126]
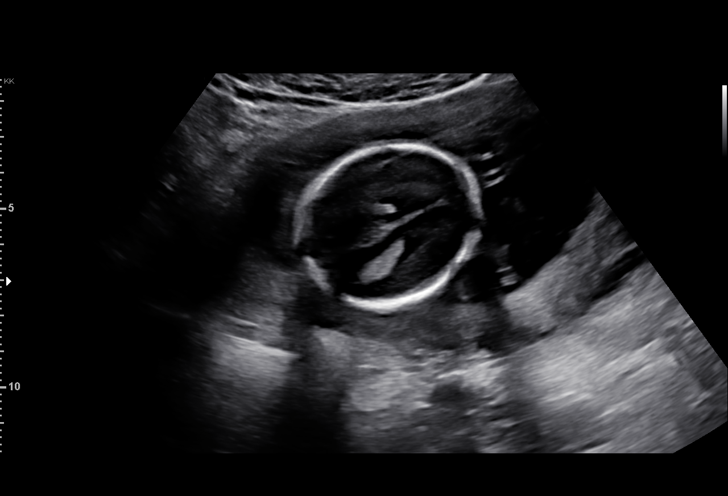
[im 121/126]
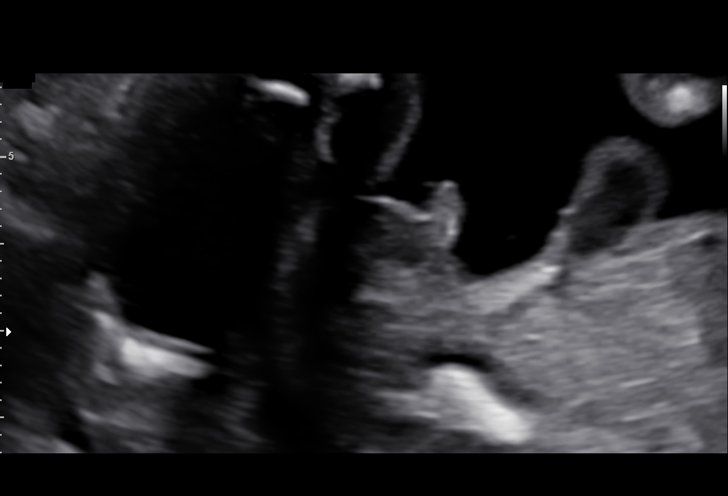

[13 of 28 positions shown; findings below may reference images not displayed]

LUI ABDI DO

 ----------------------------------------------------------------------

 ----------------------------------------------------------------------
Indications

  Obesity complicating pregnancy, second
  trimester
  Encounter for antenatal screening for
  malformations
  19 weeks gestation of pregnancy
  Previous cesarean delivery, antepartum
  Ovarian dermoid cyst complicating              O34.80,
  pregnancy, antepartum (Bilateral)
 ----------------------------------------------------------------------
Vital Signs

 BMI:
Fetal Evaluation

 Num Of Fetuses:         1
 Fetal Heart Rate(bpm):  141
 Cardiac Activity:       Observed
 Presentation:           Cephalic
 Placenta:               Posterior
 P. Cord Insertion:      Visualized

 Amniotic Fluid
 AFI FV:      Within normal limits

                             Largest Pocket(cm)

Biometry
 BPD:      44.5  mm     G. Age:  19w 3d         70  %    CI:        74.46   %    70 - 86
                                                         FL/HC:      18.1   %    16.1 -
 HC:      163.7  mm     G. Age:  19w 1d         48  %    HC/AC:      1.08        1.09 -
 AC:      151.3  mm     G. Age:  20w 2d         86  %    FL/BPD:     66.5   %
 FL:       29.6  mm     G. Age:  19w 1d         48  %    FL/AC:      19.6   %    20 - 24
 HUM:      28.6  mm     G. Age:  19w 2d         57  %
 NFT:       3.5  mm

 Est. FW:     308  gm    0 lb 11 oz      85  %
OB History

 Gravidity:    3         Term:   1        Prem:   0        SAB:   1
 TOP:          0       Ectopic:  0        Living: 1
Gestational Age

 LMP:           19w 4d        Date:  06/16/19                 EDD:   03/22/20
 U/S Today:     19w 4d                                        EDD:   03/22/20
 Best:          19w 0d     Det. By:  Early Ultrasound         EDD:   03/26/20
                                     (08/13/19)
Anatomy

 Cranium:               Appears normal         LVOT:                   Appears normal
 Cavum:                 Appears normal         Aortic Arch:            Not well visualized
 Ventricles:            Appears normal         Ductal Arch:            Not well visualized
 Choroid Plexus:        Appears normal         Diaphragm:              Appears normal
 Cerebellum:            Appears normal         Stomach:                Appears normal, left
                                                                       sided
 Posterior Fossa:       Appears normal         Abdomen:                Appears normal
 Nuchal Fold:           Appears normal         Abdominal Wall:         Appears nml (cord
                                                                       insert, abd wall)
 Face:                  Appears normal         Cord Vessels:           Appears normal (3
                        (orbits and profile)                           vessel cord)
 Lips:                  Appears normal         Kidneys:                Appear normal
 Palate:                Not well visualized    Bladder:                Appears normal
 Thoracic:              Appears normal         Spine:                  Appears normal
 Heart:                 Appears normal         Upper Extremities:      Appears normal
                        (4CH, axis, and
                        situs)
 RVOT:                  Appears normal         Lower Extremities:      Appears normal

 Other:  Male gender. Heels visualized. 5th digit visualized. Technically
         difficult due to fetal position.
Cervix Uterus Adnexa

 Left Ovary
 Cystic teratoma.

 Right Ovary
 Cystic teratoma.

 Adnexa
 No abnormality visualized.
Comments

 This patient was seen for a detailed fetal anatomy scan due
 to maternal obesity. She denies any significant past medical
 history and denies any problems in her current pregnancy.
 She has declined all screening tests for fetal aneuploidy in
 her current pregnancy.
 She was informed that the fetal growth and amniotic fluid
 level were appropriate for her gestational age.
 There were no obvious fetal anomalies noted on today's
 ultrasound exam.  However, the views of the fetal anatomy
 were limited today due to maternal body habitus and the fetal
 position.
 The patient was informed that anomalies may be missed due
 to technical limitations. If the fetus is in a suboptimal position
 or maternal habitus is increased, visualization of the fetus in
 the maternal uterus may be impaired.
 The patient was once again offered and declined a screening
 test for fetal aneuploidy.
 She will return in 4 weeks for another ultrasound to obtain
 better views of the fetal anatomy.

## 2021-12-24 IMAGING — US US MFM OB FOLLOW-UP
1 series · 13 of 28 positions shown · non-contrast
Comparison: none

[Series 1: us mfm ob follow-up · 49 acquisitions, 13 frames shown]
[im 2/49]
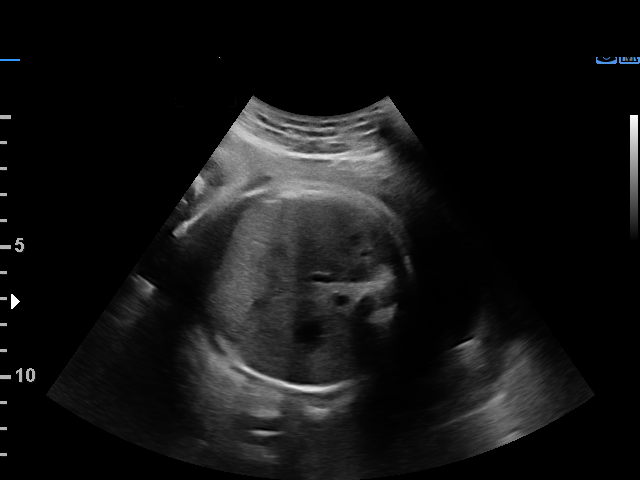
[im 6/49]
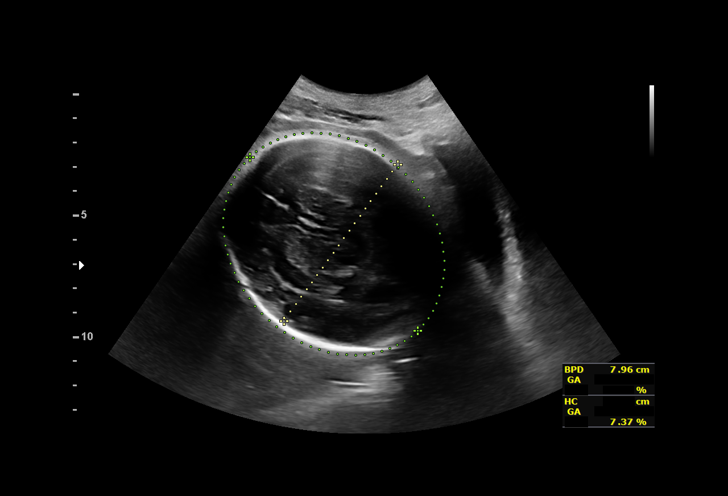
[im 9/49]
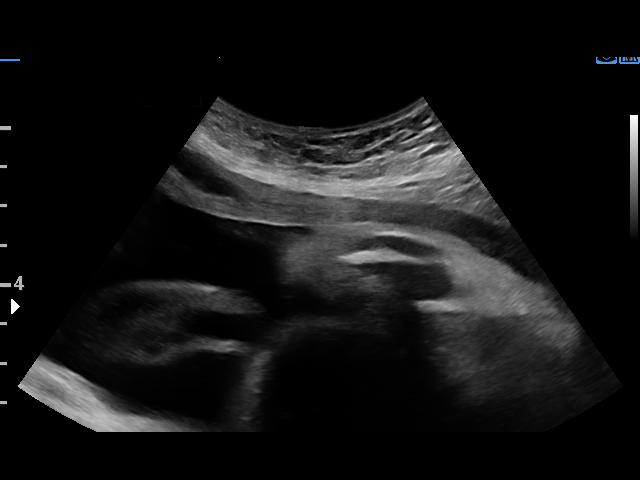
[im 13/49]
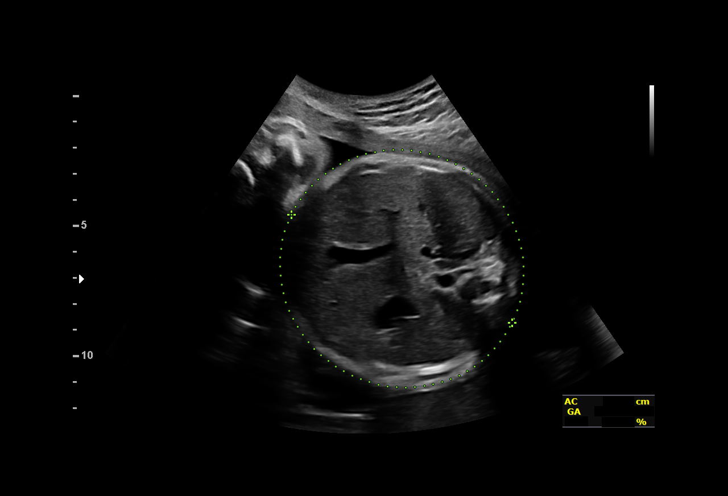
[im 17/49]
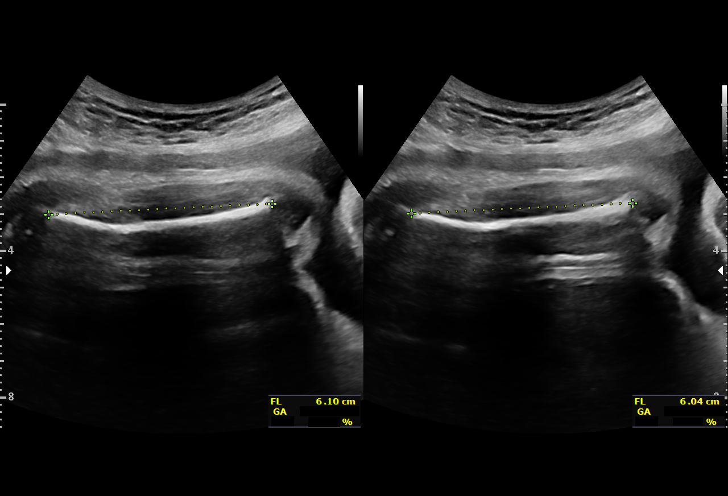
[im 20/49]
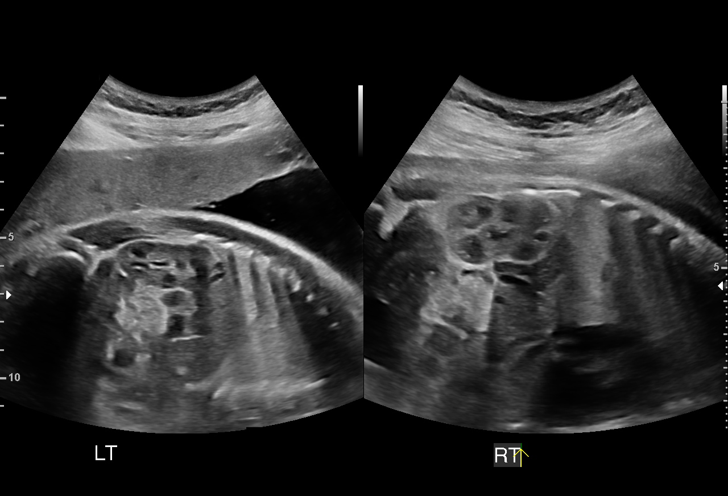
[im 25/49]
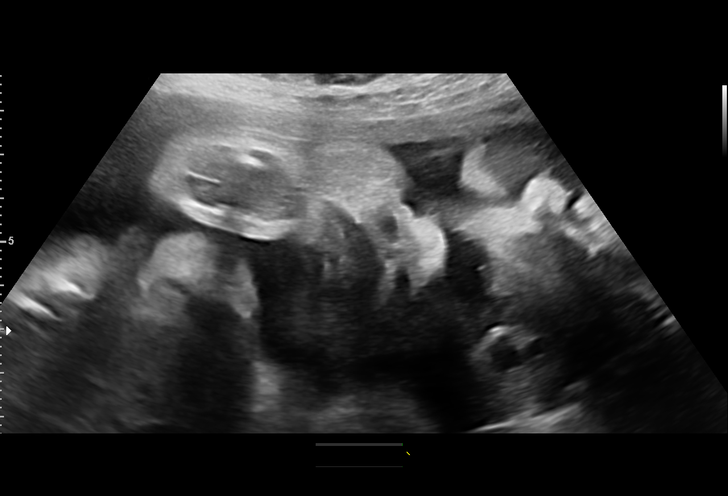
[im 29/49]
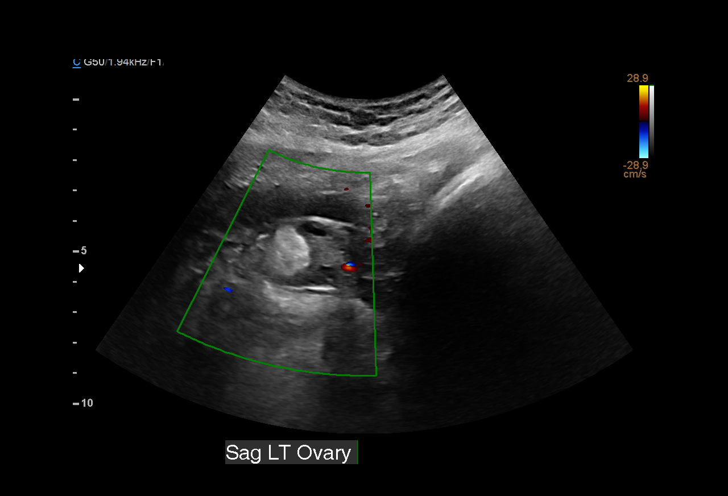
[im 33/49]
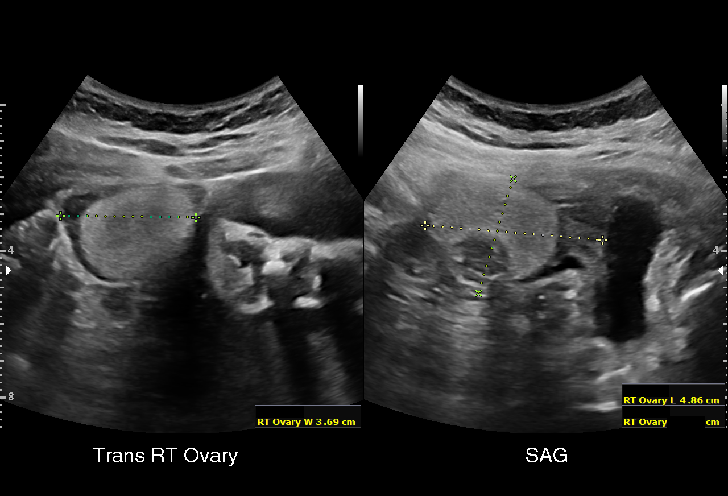
[im 36/49]
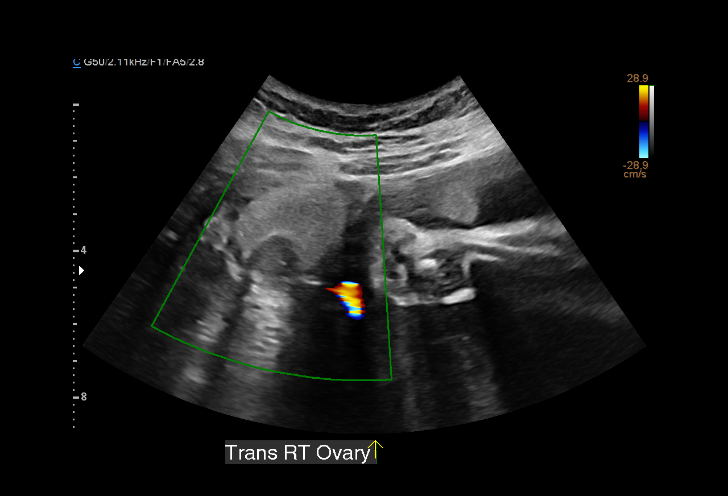
[im 40/49]
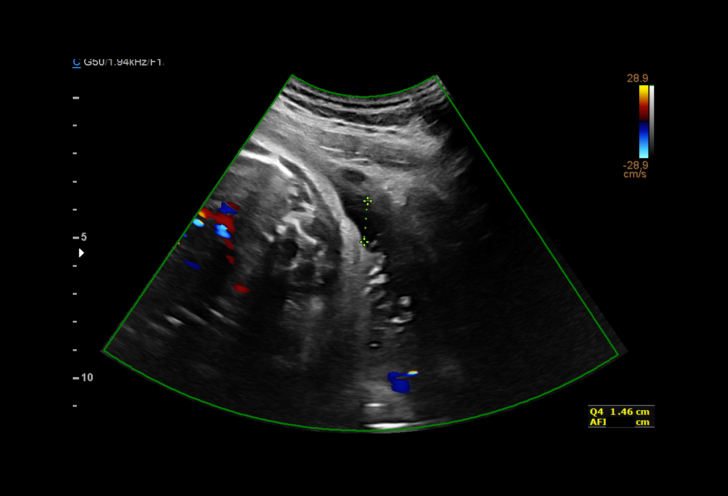
[im 43/49]
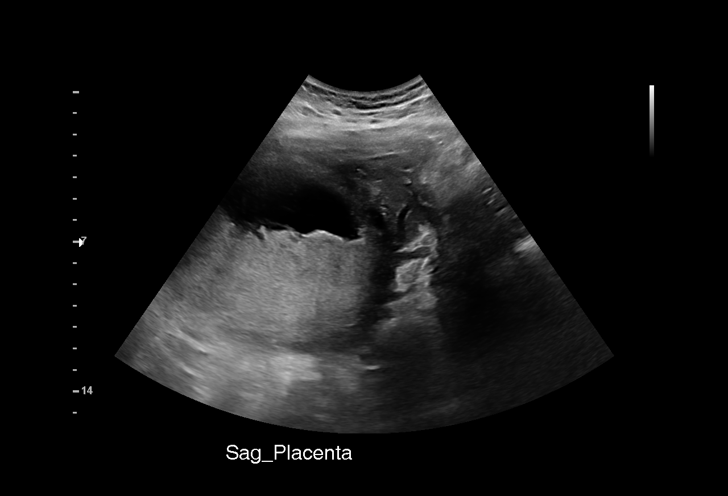
[im 47/49]
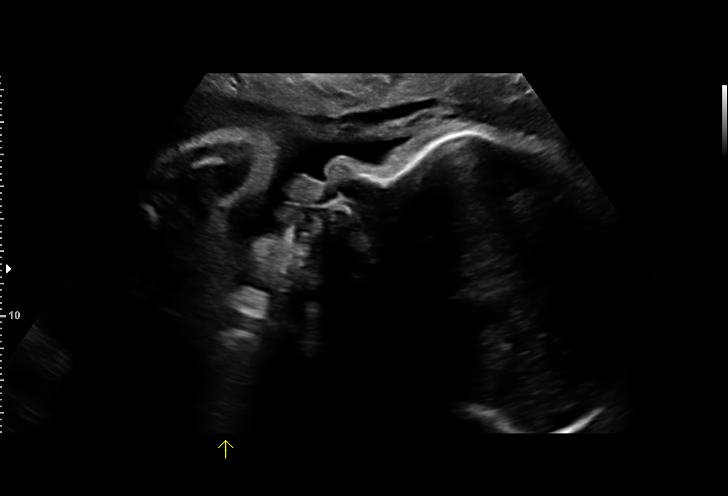

[13 of 28 positions shown; findings below may reference images not displayed]

AUJLA DO

 ----------------------------------------------------------------------

 ----------------------------------------------------------------------
Indications

  Encounter for other antenatal screening
  follow-up
  Ovarian dermoid cyst complicating              O34.80,
  pregnancy, antepartum (Bilateral)
  32 weeks gestation of pregnancy
  Obesity complicating pregnancy, second
  trimester (pregravid BMI 32)
  Previous cesarean delivery, antepartum
  Gestational diabetes in pregnancy, diet
  controlled
 ----------------------------------------------------------------------
Vital Signs

                                                Height:        5'1"
Fetal Evaluation

 Num Of Fetuses:         1
 Fetal Heart Rate(bpm):  133
 Cardiac Activity:       Observed
 Presentation:           Cephalic
 Placenta:               Posterior
 P. Cord Insertion:      Previously Visualized

 Amniotic Fluid
 AFI FV:      Within normal limits

 AFI Sum(cm)     %Tile       Largest Pocket(cm)
 9.26            10

 RUQ(cm)       RLQ(cm)       LUQ(cm)        LLQ(cm)

Biometry

 BPD:      79.9  mm     G. Age:  32w 1d         43  %    CI:        78.62   %    70 - 86
                                                         FL/HC:      21.4   %    19.1 -
 HC:       285   mm     G. Age:  31w 2d          6  %    HC/AC:      0.99        0.96 -
 AC:      288.2  mm     G. Age:  32w 6d         73  %    FL/BPD:     76.2   %    71 - 87
 FL:       60.9  mm     G. Age:  31w 4d         27  %    FL/AC:      21.1   %    20 - 24
 HUM:      55.8  mm     G. Age:  32w 3d         59  %

 Est. FW:    4822  gm      4 lb 4 oz     47  %
OB History

 Gravidity:    3         Term:   1        Prem:   0        SAB:   1
 TOP:          0       Ectopic:  0        Living: 1
Gestational Age

 LMP:           32w 4d        Date:  06/16/19                 EDD:   03/22/20
 U/S Today:     32w 0d                                        EDD:   03/26/20
 Best:          32w 0d     Det. By:  Early Ultrasound         EDD:   03/26/20
                                     (08/13/19)
Anatomy

 Cranium:               Appears normal         LVOT:                   Previously seen
 Cavum:                 Previously seen        Aortic Arch:            Previously seen
 Ventricles:            Previously seen        Ductal Arch:            Previously seen
 Choroid Plexus:        Previously seen        Diaphragm:              Appears normal
 Cerebellum:            Previously seen        Stomach:                Appears normal, left
                                                                       sided
 Posterior Fossa:       Previously seen        Abdomen:                Appears normal
 Nuchal Fold:           Previously seen        Abdominal Wall:         Previously seen
 Face:                  Orbits and profile     Cord Vessels:           Previously seen
                        previously seen
 Lips:                  Previously seen        Kidneys:                Appear normal
 Palate:                Not well visualized    Bladder:                Appears normal
 Thoracic:              Appears normal         Spine:                  Previously seen
 Heart:                 Previously seen        Upper Extremities:      Previously seen
 RVOT:                  Previously seen        Lower Extremities:      Previously seen

 Other:  Heels previously visualized. 5th digit previously visualized.
Cervix Uterus Adnexa

 Cervix
 Not visualized (advanced GA >38wks)

 Uterus
 No abnormality visualized.

 Left Ovary
 Size(cm)       3.5  x   2.4    x  2.4       Vol(ml):
 Known dermoid again seen = 6.9x6.2x6.2cm

 Right Ovary
 Size(cm)       4.9  x   3.7    x  3.3       Vol(ml):
 Known dermoid again seen = 1.1x1.1x1.1cm
Impression

 Patient returns for fetal growth assessment.  Amniotic fluid is
 normal good fetal activity seen.  Fetal growth is appropriate
 for gestational age.
 Bilateral ovarian masses consistent with dermoids are seen
 again (measurements above) and they are essentially
 pertaining to the ovarian mass.
 We reassured the patient of the findings.

 Patient has a recent diagnosis of gestational diabetes that is
 well controlled on diet.  Will make a follow-up appointment for
 her to return in 4 weeks for fetal growth assessment.  I
 informed the patient that if she needs oral hypoglycemics or
 insulin treatment, weekly BPP would be recommended.
Recommendations

 -An appointment was made for her to return in 4 weeks for
 fetal growth assessment.
                 Tae Hui, [place]

## 2022-10-30 ENCOUNTER — Other Ambulatory Visit: Payer: Self-pay

## 2022-10-30 ENCOUNTER — Encounter (HOSPITAL_COMMUNITY): Payer: Self-pay | Admitting: *Deleted

## 2022-10-30 ENCOUNTER — Emergency Department (HOSPITAL_COMMUNITY)
Admission: EM | Admit: 2022-10-30 | Discharge: 2022-10-30 | Disposition: A | Discharge status: Home / Self Care | Payer: Self-pay | Attending: Student | Admitting: Student

## 2022-10-30 ENCOUNTER — Emergency Department (HOSPITAL_COMMUNITY): Payer: Self-pay

## 2022-10-30 DIAGNOSIS — U071 COVID-19: Secondary | ICD-10-CM | POA: Insufficient documentation

## 2022-10-30 LAB — BASIC METABOLIC PANEL
Anion gap: 9 (ref 5–15)
BUN: <5 mg/dL — ABNORMAL LOW (ref 6–20)
CO2: 22 mmol/L (ref 22–32)
Calcium: 8.9 mg/dL (ref 8.9–10.3)
Chloride: 102 mmol/L (ref 98–111)
Creatinine, Ser: 0.69 mg/dL (ref 0.44–1.00)
GFR, Estimated: >60 mL/min (ref >60)
Glucose, Bld: 98 mg/dL (ref 70–99)
Potassium: 3.5 mmol/L (ref 3.5–5.1)
Sodium: 133 mmol/L — ABNORMAL LOW (ref 135–145)

## 2022-10-30 LAB — CBC WITH DIFFERENTIAL/PLATELET
Abs Immature Granulocytes: 0.05 10*3/uL (ref 0.00–0.07)
Basophils Absolute: 0 10*3/uL (ref 0.0–0.1)
Basophils Relative: 0 %
Eosinophils Absolute: 0.1 10*3/uL (ref 0.0–0.5)
Eosinophils Relative: 1 %
HCT: 41.7 % (ref 36.0–46.0)
Hemoglobin: 14 g/dL (ref 12.0–15.0)
Immature Granulocytes: 1 %
Lymphocytes Relative: 19 %
Lymphs Abs: 1.3 10*3/uL (ref 0.7–4.0)
MCH: 30 pg (ref 26.0–34.0)
MCHC: 33.6 g/dL (ref 30.0–36.0)
MCV: 89.3 fL (ref 80.0–100.0)
Monocytes Absolute: 1 10*3/uL (ref 0.1–1.0)
Monocytes Relative: 14 %
Neutro Abs: 4.7 10*3/uL (ref 1.7–7.7)
Neutrophils Relative %: 65 %
Platelets: 215 10*3/uL (ref 150–400)
RBC: 4.67 MIL/uL (ref 3.87–5.11)
RDW: 12.6 % (ref 11.5–15.5)
WBC: 7.1 10*3/uL (ref 4.0–10.5)
nRBC: 0 % (ref 0.0–0.2)

## 2022-10-30 MED ORDER — IPRATROPIUM-ALBUTEROL 0.5-2.5 (3) MG/3ML IN SOLN
3.0000 mL | Freq: Once | RESPIRATORY_TRACT | Status: AC
  Administered 2022-10-30: 3 mL via RESPIRATORY_TRACT
  Filled 2022-10-30: qty 3

## 2022-10-30 NOTE — ED Triage Notes (Signed)
Pt c/o sob that started yesterday when she was diagnosed with covid

## 2022-10-30 NOTE — Discharge Instructions (Signed)
You were seen today for shortness of breath.  Your symptoms are consistent with a continued COVID-19 infection.  Your chest x-ray and basic lab work were unremarkable and reassuring for no signs of pneumonia or significant electrolyte abnormality. Please continue to take the Paxlovid as prescribed at [facility] yesterday.  I recommend adding acetaminophen and ibuprofen as needed for fever and pain control. Be sure to get plenty of rest and drink fluids as you are able.

## 2022-10-30 NOTE — ED Provider Notes (Signed)
[facility] EMERGENCY DEPARTMENT Provider Note   CSN: [id number] Arrival date & time: 10/30/22  0801     History  Chief Complaint  Patient presents with   Shortness of Breath    Judy James is a 37 y.o. female.  Patient presents the emergency department complaining of shortness of breath secondary to a COVID infection.  Patient states that she has had symptoms of shortness of breath and cough for the past 4 days.  She was seen yesterday at urgent care and was diagnosed with a COVID-19 infection.  She was prescribed Paxlovid at that time.  She comes the emergency department today complaining of continued shortness of breath.  The patient endorses cough, mild shortness of breath, 2 episodes of vomiting after the "coughing fits."  She denies chest pain, abdominal pain, nausea, urinary symptoms at this time.  The patient has not taken any Tylenol or Advil at home.  Past medical history significant for migraines HPI     Home Medications Prior to Admission medications   Medication Sig Start Date End Date Taking? Authorizing Provider  docusate sodium (COLACE) 100 MG capsule Take 1 capsule (100 mg total) by mouth 2 (two) times daily as needed. Patient not taking: No sig reported 11/26/18   Aletha Halim, MD  Elastic Bandages & Supports (COMFORT FIT MATERNITY SUPP LG) MISC 1 Units by Does not apply route daily as needed. Patient not taking: No sig reported 10/03/19   Luvenia Redden, PA-C  ferrous sulfate 325 (65 FE) MG tablet Take 1 tablet (325 mg total) by mouth 2 (two) times daily with a meal. 10/10/19 11/09/19  Luvenia Redden, PA-C  ondansetron (ZOFRAN ODT) 4 MG disintegrating tablet Take 1 tablet (4 mg total) by mouth every 8 (eight) hours as needed for nausea or vomiting. Patient not taking: No sig reported 11/14/18   Lajean Manes, CNM  Prenatal Vit-Fe Fumarate-FA (MULTIVITAMIN-PRENATAL) 27-0.8 MG TABS tablet Take 1 tablet by mouth daily at 12 noon. Patient not taking: Reported on  06/03/2021    [provider]  simethicone (GAS-X) 80 MG chewable tablet Chew 1 tablet (80 mg total) by mouth every 6 (six) hours as needed for flatulence. Patient not taking: No sig reported 11/20/19   Seabron Spates, CNM      Allergies    Penicillin g    Review of Systems   Review of Systems  Constitutional:  Negative for fever.  Respiratory:  Positive for cough and shortness of breath.   Cardiovascular:  Negative for chest pain.  Gastrointestinal:  Positive for vomiting. Negative for abdominal pain and nausea.  Musculoskeletal:  Positive for myalgias.    Physical Exam Updated Vital Signs BP 107/64   Pulse 87   Temp 99.4 F (37.4 C) (Oral)   Resp 17   Ht '5\' 1"'$  (1.549 m)   Wt 83 kg   LMP 10/10/2022   SpO2 100%   BMI 34.58 kg/m  Physical Exam Vitals and nursing note reviewed.  Constitutional:      General: She is not in acute distress.    Appearance: She is well-developed. She is obese.  HENT:     Head: Normocephalic and atraumatic.  Eyes:     Conjunctiva/sclera: Conjunctivae normal.  Cardiovascular:     Rate and Rhythm: Normal rate and regular rhythm.     Heart sounds: No murmur heard. Pulmonary:     Effort: Pulmonary effort is normal. No respiratory distress.     Breath sounds: Normal breath  sounds. No decreased breath sounds, wheezing or rhonchi.  Abdominal:     Palpations: Abdomen is soft.     Tenderness: There is no abdominal tenderness.  Musculoskeletal:        General: No swelling.     Cervical back: Neck supple.     Right lower leg: No edema.     Left lower leg: No edema.  Skin:    General: Skin is warm and dry.     Capillary Refill: Capillary refill takes less than 2 seconds.  Neurological:     Mental Status: She is alert.  Psychiatric:        Mood and Affect: Mood normal.     ED Results / Procedures / Treatments   Labs (all labs ordered are listed, but only abnormal results are displayed) Labs Reviewed  BASIC METABOLIC PANEL -  Abnormal; Notable for the following components:      Result Value   Sodium 133 (*)    BUN <5 (*)    All other components within normal limits  CBC WITH DIFFERENTIAL/PLATELET    EKG None  Radiology DG Chest Port 1 View  Result Date: 10/30/2022 CLINICAL DATA:  Shortness of breath. Coughing. Diagnosed with COVID yesterday. EXAM: PORTABLE CHEST 1 VIEW COMPARISON:  None available FINDINGS: Cardiac silhouette and mediastinal contours are within normal limits. The lungs are clear. No pleural effusion or pneumothorax. No acute skeletal abnormality. IMPRESSION: No active disease. Electronically Signed   By: Yvonne Kendall M.D.   On: 10/30/2022 09:23    Procedures Procedures    Medications Ordered in ED Medications  ipratropium-albuterol (DUONEB) 0.5-2.5 (3) MG/3ML nebulizer solution 3 mL (has no administration in time range)    ED Course/ Medical Decision Making/ A&P                           Medical Decision Making Amount and/or Complexity of Data Reviewed Labs: ordered.   This patient presents to the ED for concern of shortness of breath, this involves an extensive number of treatment options, and is a complaint that carries with it a high risk of complications and morbidity.  The differential diagnosis includes COVID-19 (infection known), pneumonia, asthma, pneumothorax, and others   Co morbidities that complicate the patient evaluation  Obesity   Additional history obtained:   External records from outside source obtained and reviewed including notes from [facility] from yesterday where the patient was diagnosed with COVID-19 and prescribed Paxlovid   Lab Tests:  I Ordered, and personally interpreted labs.  The pertinent results include: Grossly unremarkable BMP, CBC   Imaging Studies ordered:  I ordered imaging studies including chest x-ray I independently visualized and interpreted imaging which showed no active disease I agree with the radiologist  interpretation   Cardiac Monitoring: / EKG:  The patient was maintained on a cardiac monitor.  I personally viewed and interpreted the cardiac monitored which showed an underlying rhythm of: Sinus tachycardia, rate 103   Problem List / ED Course / Critical interventions / Medication management   I ordered medication including DuoNeb for shortness of breath Reevaluation of the patient after these medicines showed that the patient improved I have reviewed the patients home medicines and have made adjustments as needed   Test / Admission - Considered:  The patient appears to have shortness of breath due to COVID-19.  Her oxygen saturations have maintained between 97 and 100% throughout the encounter.  Her chest x-ray was  unremarkable.  No signs of pneumonia, pneumothorax's.  The patient has a very low-grade fever at this time.  Plan to discharge patient home with recommendations for continued Paxlovid with the addition of either acetaminophen or ibuprofen for fever and pain control.  Patient has been advised to be sure to hydrate if she is able to get plenty of rest.        Final Clinical Impression(s) / ED Diagnoses Final diagnoses:  COVID-19    Rx / DC Orders ED Discharge Orders     None         Ronny Bacon 10/30/22 [place], [place], MD 10/30/22 1844

## 2023-05-12 IMAGING — US US PELVIS COMPLETE WITH TRANSVAGINAL
1 series · 14 of 25 positions shown · non-contrast
Comparison: Ultrasound from 02/27/2020

CLINICAL DATA: Follow-up examination for dermoid cyst.



[Series 1: us pelvic complete with transvaginal · 94 acquisitions, 14 frames shown]
[im 1/94]
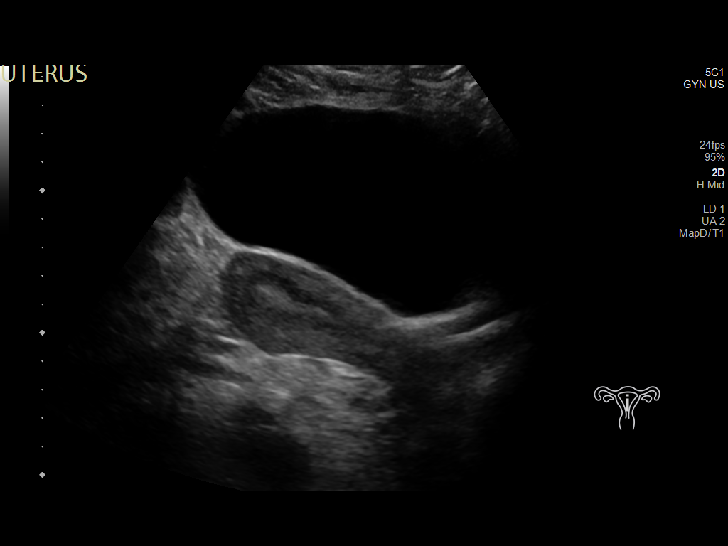
[im 8/94]
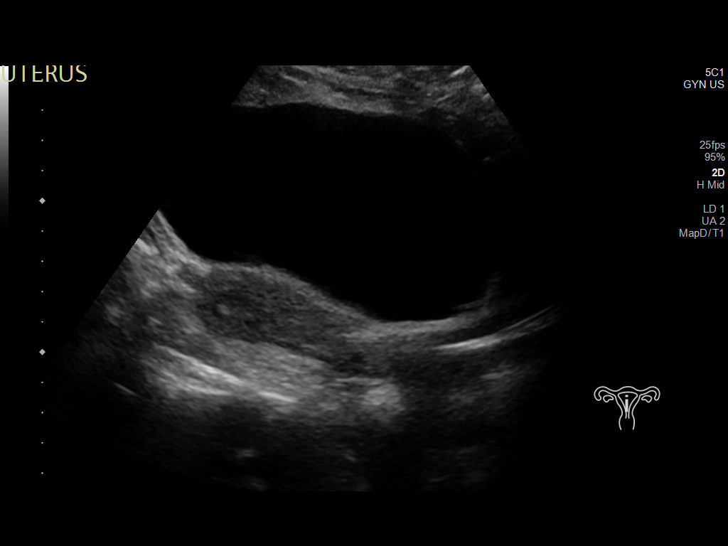
[im 16/94]
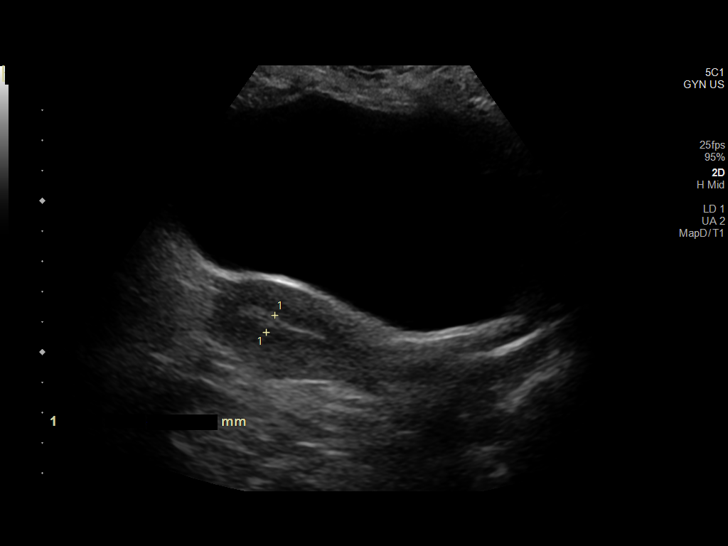
[im 24/94]
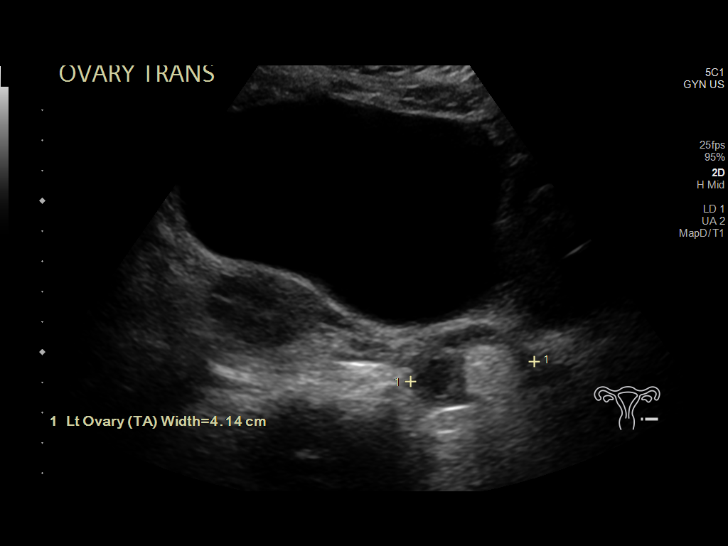
[im 32/94]
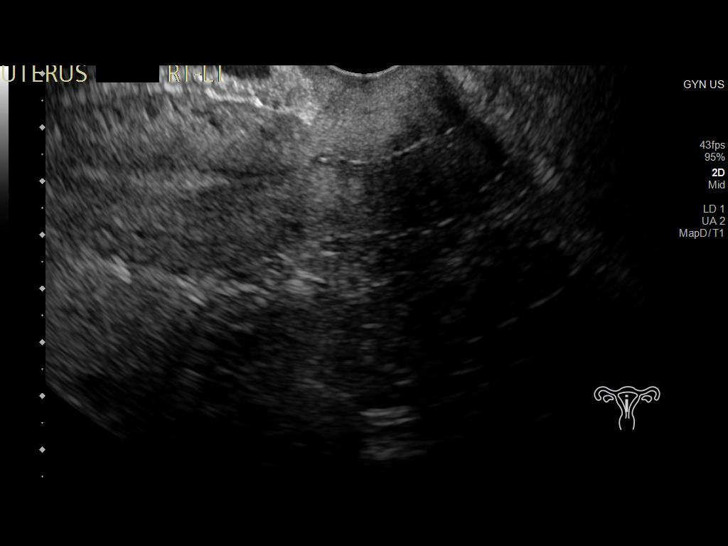
[im 35/94]
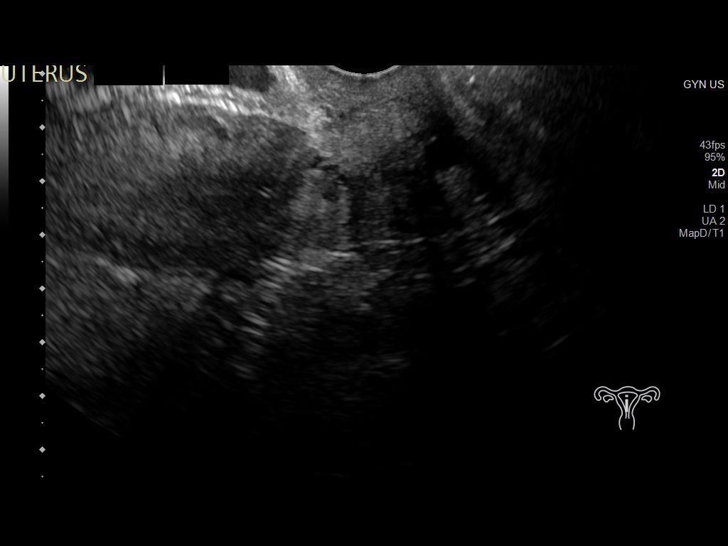
[im 43/94]
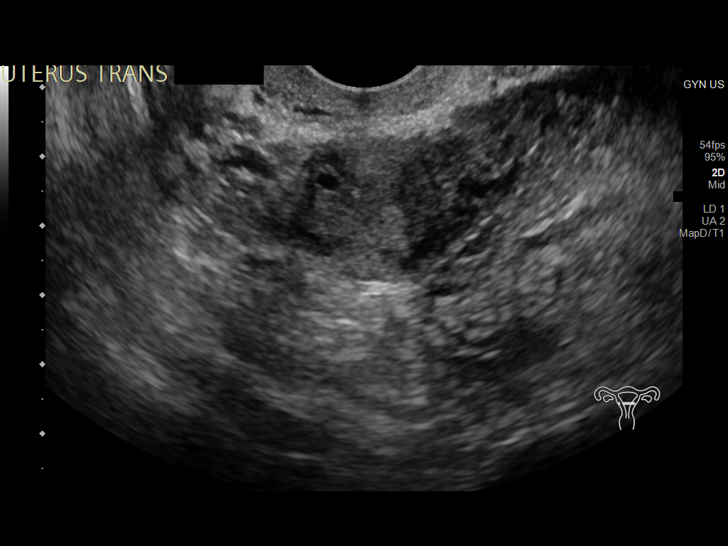
[im 51/94]
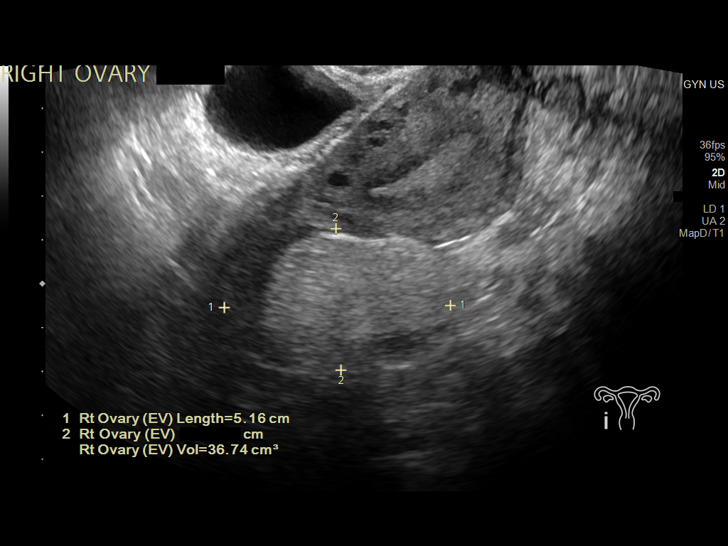
[im 59/94]
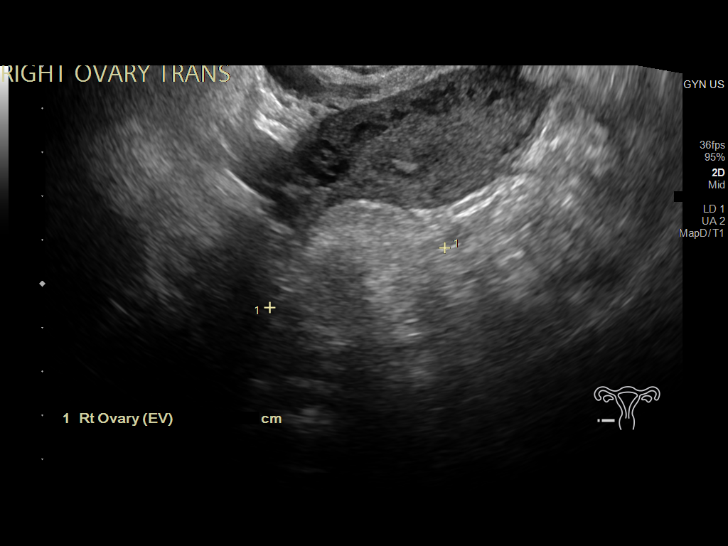
[im 63/94]
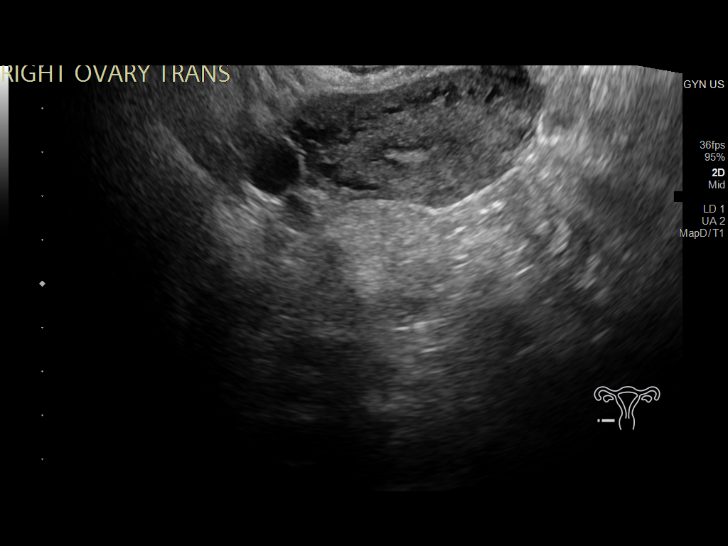
[im 70/94]
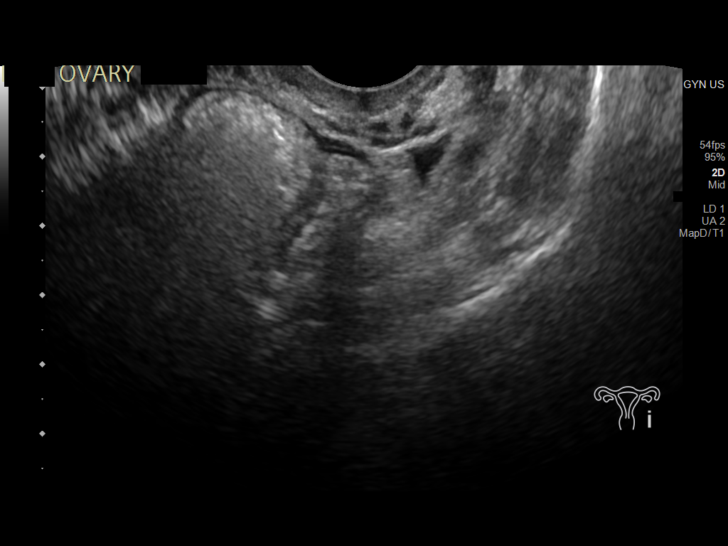
[im 78/94]
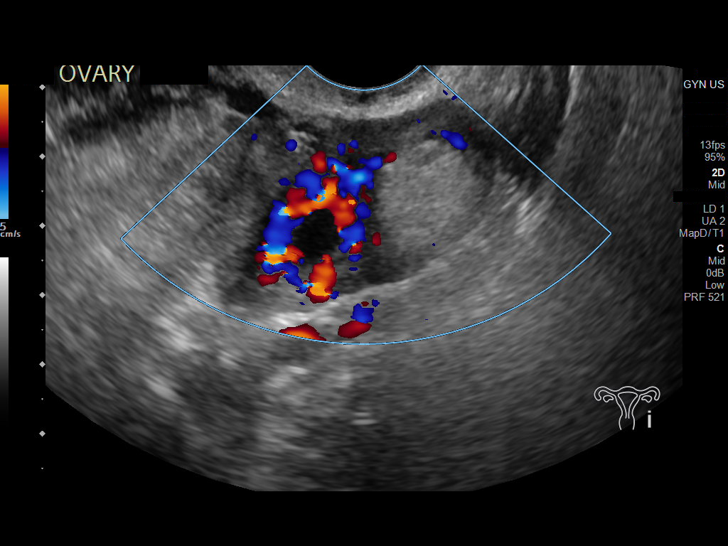
[im 86/94]
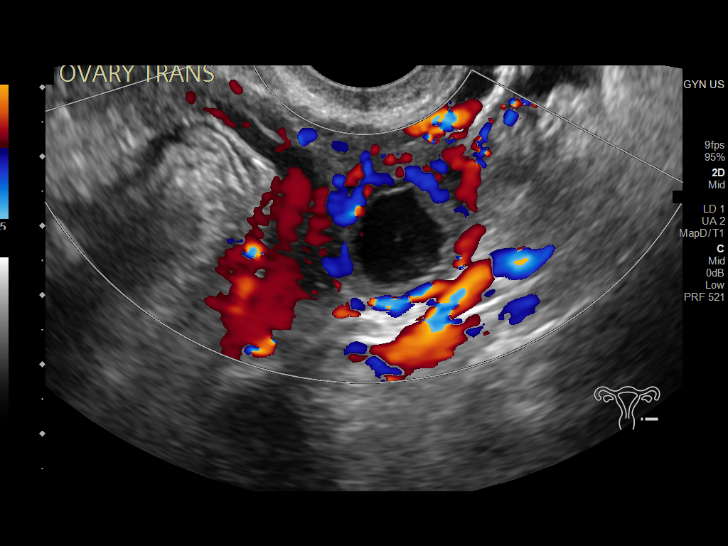
[im 94/94]
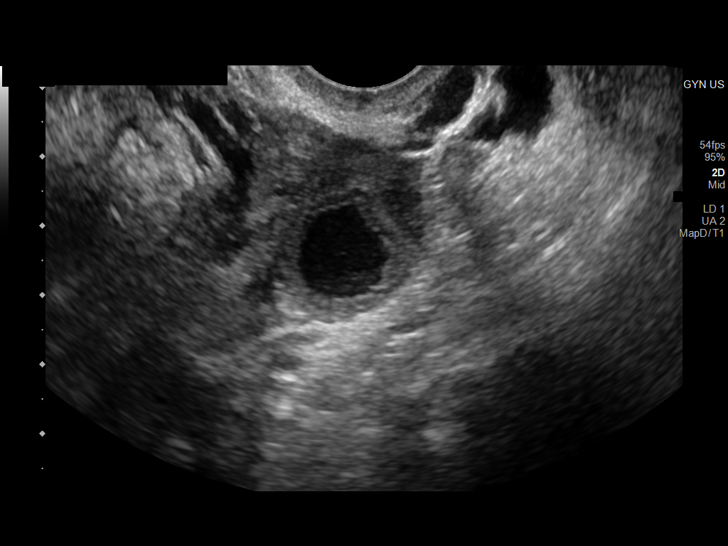

[14 of 25 positions shown; findings below may reference images not displayed]

FINDINGS: Uterus

Measurements: 7.5 x 3.2 x 3.6 cm = volume: 45.0 mL. No fibroids or
other mass visualized.

Endometrium

Thickness: 5.2 mm.  No focal abnormality visualized.

Right ovary

Measurements: 5.2 x 3.2 x 4.2 cm = volume: 37.0 mL. 4.4 x 2.6 x
cm hyperechoic lesion, consistent with dermoid.

Left ovary

Measurements: 3.6 x 2.4 x 2.4 cm = volume: 11.1 mL. 1.4 x 1.6 x
cm hyperechoic lesion, consistent with dermoid. Adjacent
degenerating corpus luteal cyst noted.

Other findings

No abnormal free fluid.
IMPRESSION: Bilateral ovarian dermoids, measuring up to 4.4 cm on the right and
1.7 cm on the left.
